# Patient Record
Sex: Female | Born: 1960 | Race: Black or African American | Hispanic: No | Smoking: Former smoker
Health system: Southern US, Community
[De-identification: ages and names within clinical notes are randomized; demographics above are authoritative.]

## PROBLEM LIST (undated history)

## (undated) DIAGNOSIS — F4541 Pain disorder exclusively related to psychological factors: Secondary | ICD-10-CM

## (undated) DIAGNOSIS — R51 Headache: Secondary | ICD-10-CM

## (undated) DIAGNOSIS — E119 Type 2 diabetes mellitus without complications: Secondary | ICD-10-CM

## (undated) DIAGNOSIS — D573 Sickle-cell trait: Secondary | ICD-10-CM

## (undated) DIAGNOSIS — M1990 Unspecified osteoarthritis, unspecified site: Secondary | ICD-10-CM

## (undated) DIAGNOSIS — G4730 Sleep apnea, unspecified: Secondary | ICD-10-CM

## (undated) DIAGNOSIS — R002 Palpitations: Secondary | ICD-10-CM

## (undated) DIAGNOSIS — M419 Scoliosis, unspecified: Secondary | ICD-10-CM

## (undated) DIAGNOSIS — F418 Other specified anxiety disorders: Secondary | ICD-10-CM

## (undated) DIAGNOSIS — I4891 Unspecified atrial fibrillation: Secondary | ICD-10-CM

## (undated) DIAGNOSIS — M47816 Spondylosis without myelopathy or radiculopathy, lumbar region: Secondary | ICD-10-CM

## (undated) DIAGNOSIS — I10 Essential (primary) hypertension: Secondary | ICD-10-CM

## (undated) DIAGNOSIS — E559 Vitamin D deficiency, unspecified: Secondary | ICD-10-CM

## (undated) DIAGNOSIS — E876 Hypokalemia: Secondary | ICD-10-CM

## (undated) DIAGNOSIS — F5105 Insomnia due to other mental disorder: Secondary | ICD-10-CM

## (undated) DIAGNOSIS — R519 Headache, unspecified: Secondary | ICD-10-CM

## (undated) DIAGNOSIS — E785 Hyperlipidemia, unspecified: Secondary | ICD-10-CM

## (undated) DIAGNOSIS — M1611 Unilateral primary osteoarthritis, right hip: Secondary | ICD-10-CM

## (undated) DIAGNOSIS — G4733 Obstructive sleep apnea (adult) (pediatric): Secondary | ICD-10-CM

## (undated) DIAGNOSIS — F5104 Psychophysiologic insomnia: Secondary | ICD-10-CM

## (undated) DIAGNOSIS — E538 Deficiency of other specified B group vitamins: Secondary | ICD-10-CM

## (undated) DIAGNOSIS — M546 Pain in thoracic spine: Secondary | ICD-10-CM

## (undated) DIAGNOSIS — K5090 Crohn's disease, unspecified, without complications: Secondary | ICD-10-CM

## (undated) DIAGNOSIS — Z800 Family history of malignant neoplasm of digestive organs: Secondary | ICD-10-CM

## (undated) DIAGNOSIS — G8929 Other chronic pain: Secondary | ICD-10-CM

## (undated) HISTORY — DX: Pain disorder exclusively related to psychological factors: F45.41

## (undated) HISTORY — DX: Palpitations: R00.2

## (undated) HISTORY — DX: Essential (primary) hypertension: I10

## (undated) HISTORY — DX: Spondylosis without myelopathy or radiculopathy, lumbar region: M47.816

## (undated) HISTORY — DX: Hypokalemia: E87.6

## (undated) HISTORY — DX: Insomnia due to other mental disorder: F51.05

## (undated) HISTORY — PX: EYE SURGERY: SHX253

## (undated) HISTORY — PX: COLONOSCOPY: SHX174

## (undated) HISTORY — DX: Family history of malignant neoplasm of digestive organs: Z80.0

## (undated) HISTORY — DX: Scoliosis, unspecified: M41.9

## (undated) HISTORY — PX: ABDOMINAL HYSTERECTOMY: SHX81

## (undated) HISTORY — DX: Hyperlipidemia, unspecified: E78.5

## (undated) HISTORY — DX: Pain in thoracic spine: M54.6

## (undated) HISTORY — DX: Other chronic pain: G89.29

## (undated) HISTORY — DX: Unspecified osteoarthritis, unspecified site: M19.90

---

## 2005-04-11 ENCOUNTER — Ambulatory Visit: Payer: Self-pay | Admitting: Family Medicine

## 2005-06-17 ENCOUNTER — Emergency Department: Payer: Self-pay | Admitting: Unknown Physician Specialty

## 2005-06-17 ENCOUNTER — Other Ambulatory Visit: Payer: Self-pay

## 2005-07-19 ENCOUNTER — Ambulatory Visit: Payer: Self-pay

## 2007-04-06 ENCOUNTER — Ambulatory Visit: Payer: Self-pay | Admitting: Unknown Physician Specialty

## 2007-05-18 ENCOUNTER — Ambulatory Visit: Payer: Self-pay | Admitting: Unknown Physician Specialty

## 2007-07-24 ENCOUNTER — Ambulatory Visit: Payer: Self-pay | Admitting: Family Medicine

## 2007-10-26 ENCOUNTER — Ambulatory Visit: Payer: Self-pay | Admitting: Family Medicine

## 2008-01-22 ENCOUNTER — Emergency Department: Payer: Self-pay | Admitting: Emergency Medicine

## 2008-01-22 ENCOUNTER — Other Ambulatory Visit: Payer: Self-pay

## 2009-10-11 ENCOUNTER — Ambulatory Visit: Payer: Self-pay | Admitting: Family Medicine

## 2010-06-06 ENCOUNTER — Emergency Department: Payer: Self-pay | Admitting: Emergency Medicine

## 2011-06-07 ENCOUNTER — Ambulatory Visit: Payer: Self-pay | Admitting: Family Medicine

## 2011-08-22 ENCOUNTER — Ambulatory Visit: Payer: Self-pay | Admitting: Family Medicine

## 2012-08-10 ENCOUNTER — Ambulatory Visit: Payer: Self-pay | Admitting: Internal Medicine

## 2012-09-25 ENCOUNTER — Ambulatory Visit: Payer: Self-pay | Admitting: Unknown Physician Specialty

## 2012-10-20 ENCOUNTER — Emergency Department: Payer: Self-pay | Admitting: Emergency Medicine

## 2012-10-20 LAB — CBC
HCT: 38.9 % (ref 35.0–47.0)
HGB: 13.5 g/dL (ref 12.0–16.0)
MCHC: 34.8 g/dL (ref 32.0–36.0)
Platelet: 245 10*3/uL (ref 150–440)
RDW: 14.4 % (ref 11.5–14.5)
WBC: 10.1 10*3/uL (ref 3.6–11.0)

## 2012-10-20 LAB — COMPREHENSIVE METABOLIC PANEL
Alkaline Phosphatase: 93 U/L (ref 50–136)
Anion Gap: 6 — ABNORMAL LOW (ref 7–16)
Bilirubin,Total: 0.2 mg/dL (ref 0.2–1.0)
Calcium, Total: 8.9 mg/dL (ref 8.5–10.1)
Co2: 29 mmol/L (ref 21–32)
Creatinine: 1.05 mg/dL (ref 0.60–1.30)
EGFR (Non-African Amer.): >60
Osmolality: 277 (ref 275–301)
Potassium: 3.5 mmol/L (ref 3.5–5.1)
SGPT (ALT): 33 U/L (ref 12–78)
Sodium: 138 mmol/L (ref 136–145)

## 2012-10-20 LAB — CK TOTAL AND CKMB (NOT AT ARMC)
CK, Total: 278 U/L — ABNORMAL HIGH (ref 21–215)
CK-MB: 1.9 ng/mL (ref 0.5–3.6)

## 2012-10-20 LAB — TROPONIN I: Troponin-I: <0.02 ng/mL

## 2014-04-09 ENCOUNTER — Emergency Department: Payer: Self-pay | Admitting: Emergency Medicine

## 2014-07-13 ENCOUNTER — Ambulatory Visit: Payer: Self-pay | Admitting: Nurse Practitioner

## 2015-08-18 ENCOUNTER — Encounter: Payer: Self-pay | Admitting: Family Medicine

## 2015-08-18 ENCOUNTER — Ambulatory Visit: Payer: Self-pay | Admitting: Family Medicine

## 2015-08-18 ENCOUNTER — Ambulatory Visit (INDEPENDENT_AMBULATORY_CARE_PROVIDER_SITE_OTHER): Payer: 59 | Admitting: Family Medicine

## 2015-08-18 VITALS — BP 114/76 | HR 86 | Temp 98.8°F | Resp 16 | Ht 64.5 in | Wt 165.8 lb

## 2015-08-18 DIAGNOSIS — I10 Essential (primary) hypertension: Secondary | ICD-10-CM | POA: Insufficient documentation

## 2015-08-18 DIAGNOSIS — M549 Dorsalgia, unspecified: Secondary | ICD-10-CM

## 2015-08-18 DIAGNOSIS — G4700 Insomnia, unspecified: Secondary | ICD-10-CM | POA: Diagnosis not present

## 2015-08-18 DIAGNOSIS — E785 Hyperlipidemia, unspecified: Secondary | ICD-10-CM

## 2015-08-18 DIAGNOSIS — M545 Low back pain, unspecified: Secondary | ICD-10-CM

## 2015-08-18 DIAGNOSIS — G8929 Other chronic pain: Secondary | ICD-10-CM | POA: Insufficient documentation

## 2015-08-18 DIAGNOSIS — F5104 Psychophysiologic insomnia: Secondary | ICD-10-CM | POA: Insufficient documentation

## 2015-08-18 DIAGNOSIS — M546 Pain in thoracic spine: Secondary | ICD-10-CM

## 2015-08-18 HISTORY — DX: Dorsalgia, unspecified: M54.9

## 2015-08-18 HISTORY — DX: Other chronic pain: G89.29

## 2015-08-18 MED ORDER — BENICAR HCT 40-12.5 MG PO TABS: 1.0000 | ORAL_TABLET | Freq: Every day | ORAL | Status: DC

## 2015-08-18 MED ORDER — ZOLPIDEM TARTRATE 10 MG PO TABS: 10.0000 mg | ORAL_TABLET | Freq: Every day | ORAL | Status: DC

## 2015-08-18 MED ORDER — SIMVASTATIN 40 MG PO TABS: 40.0000 mg | ORAL_TABLET | Freq: Every day | ORAL | Status: DC

## 2015-08-18 NOTE — Progress Notes (Signed)
Name: Samantha Stephens   MRN: [id number]    DOB: January 16, 1961   Date:08/18/2015       Progress Note  Subjective  Chief Complaint  Chief Complaint  Patient presents with  . Establish Care  . Medication Refill  . Arthritis    patient was told that she has arthritis in her back.  . Headache    patient has had a history of right sided head pain but is not sure if it is stress related    HPI  Samantha Stephens is a 54 y.o. female here today to transition care of medical needs to a primary care provider. She reports some months ago she was bending down to get something out of a cabinet and pulled her back. She was seen at [facility], no imaging done, given toradol, ibuprofen 800 mg and muscle relaxer medications and her symptoms improved over a few days. She reports no back pain today.  Has her annual CPE exams Jan-Feb yearly and reports no recent PAP due to total hysterectomy due to fibroid uterus. Has had mammogram early this year and flu shot to be done Aug 28, 2015.   Hyperlipidemia: Patient presents with hyperlipidemia.  She was tested because of comorbid conditions such as HTN. There is a family history of hyperlipidemia. There is not a family history of early ischemia heart disease.  She continues to use her Simvastatin 40 mg one a day with no side effects.   Patient is here for routine follow up of Hypertension. First diagnosed with hypertension several years ago. Current anti-hypertension medication regimen includes dietary modification, weight management and Brand name Benicar HCT 40-12.5 mg one a day, generic was not as effective.  Patient is following physician recommended management. Not checking blood pressure outside of physician office. Associated symptoms do not include headache, dizziness, nausea, lower extremity swelling, worsening shortness of breath, chest pain, numbness.  Insomnia Patient reports several years of difficulty initiating sleep and maintaining adequate sleep latency. Has  tried behavioral techniques and sleep hygiene with some modest benefits. OTC medications include benadryl and melatonin use in the past again with some modest benefits initially but overtime efficacy waned. Currently using Ambien 10 mg one at bed time nearly every night with no side effects. Improved sleep onset and latency with this medication.   Past Medical History  Diagnosis Date  . Hyperlipidemia   . Hypertension   . Arthritis   . Stress headaches     Patient Active Problem List   Diagnosis Date Noted  . Hypertension goal BP (blood pressure) < 140/90 08/18/2015  . Hyperlipidemia LDL goal <100 08/18/2015  . Insomnia, controlled 08/18/2015  . Acute lumbar back pain 08/18/2015    Social History  Substance Use Topics  . Smoking status: Former Research scientist (life sciences)  . Smokeless tobacco: Not on file     Comment: but only for a short amount of time  . Alcohol Use: Not on file     Comment: occasional glass of wine     Current outpatient prescriptions:  .  BENICAR HCT 40-12.5 MG tablet, Take 1 tablet by mouth daily., Disp: 90 tablet, Rfl: 3 .  simvastatin (ZOCOR) 40 MG tablet, Take 1 tablet (40 mg total) by mouth daily at 6 PM., Disp: 90 tablet, Rfl: 3 .  zolpidem (AMBIEN) 10 MG tablet, Take 1 tablet (10 mg total) by mouth at bedtime., Disp: 90 tablet, Rfl: 3  Past Surgical History  Procedure Laterality Date  . Abdominal hysterectomy    .  Cesarean section      1    Family History  Problem Relation Age of Onset  . Arthritis Mother   . COPD Mother   . Depression Mother   . Diabetes Mother   . Hyperlipidemia Mother   . Hypertension Mother   . Cataracts Mother   . Cancer Father   . Hearing loss Father   . Kidney disease Father   . Cataracts Father   . Asthma Sister   . Cancer Paternal Grandmother   . Mental illness Sister     No Known Allergies   Review of Systems  CONSTITUTIONAL: No significant weight changes, fever, chills, weakness or fatigue.  HEENT:  - Eyes: No visual  changes.  - Ears: No auditory changes. No pain.  - Nose: No sneezing, congestion, runny nose. - Throat: No sore throat. No changes in swallowing. SKIN: No rash or itching.  CARDIOVASCULAR: No chest pain, chest pressure or chest discomfort. No palpitations or edema.  RESPIRATORY: No shortness of breath, cough or sputum.  GASTROINTESTINAL: No anorexia, nausea, vomiting. No changes in bowel habits. No abdominal pain or blood.  GENITOURINARY: No dysuria. No frequency. No discharge. NEUROLOGICAL: No headache, dizziness, syncope, paralysis, ataxia, numbness or tingling in the extremities. No memory changes. No change in bowel or bladder control.  MUSCULOSKELETAL: No joint pain. No muscle pain. HEMATOLOGIC: No anemia, bleeding or bruising.  LYMPHATICS: No enlarged lymph nodes.  PSYCHIATRIC: No change in mood. No change in sleep pattern.  ENDOCRINOLOGIC: No reports of sweating, cold or heat intolerance. No polyuria or polydipsia.     Objective  BP 114/76 mmHg  Pulse 86  Temp(Src) 98.8 F (37.1 C) (Oral)  Resp 16  Ht 5' 4.5" (1.638 m)  Wt 165 lb 12.8 oz (75.206 kg)  BMI 28.03 kg/m2  SpO2 97% Body mass index is 28.03 kg/(m^2).  Physical Exam  Constitutional: Patient appears well-developed and well-nourished. In no distress.  HEENT:  - Head: Normocephalic and atraumatic.  - Ears: Bilateral TMs gray, no erythema or effusion - Nose: Nasal mucosa moist - Mouth/Throat: Oropharynx is clear and moist. No tonsillar hypertrophy or erythema. No post nasal drainage.  - Eyes: Conjunctivae clear, EOM movements normal. PERRLA. No scleral icterus.  Neck: Normal range of motion. Neck supple. No JVD present. No thyromegaly present.  Cardiovascular: Normal rate, regular rhythm and normal heart sounds.  No murmur heard.  Pulmonary/Chest: Effort normal and breath sounds normal. No respiratory distress. Musculoskeletal: Normal range of motion bilateral UE and LE, no joint effusions. Peripheral vascular:  Bilateral LE no edema. Neurological: CN II-XII grossly intact with no focal deficits. Alert and oriented to person, place, and time. Coordination, balance, strength, speech and gait are normal.  Skin: Skin is warm and dry. No rash noted. No erythema.  Psychiatric: Patient has a normal mood and affect. Behavior is normal in office today. Judgment and thought content normal in office today.   Assessment & Plan  1. Hypertension goal BP (blood pressure) < 140/90 Stable. Refilled.  - BENICAR HCT 40-12.5 MG tablet; Take 1 tablet by mouth daily.  Dispense: 90 tablet; Refill: 3 - CBC with Differential/Platelet - Comprehensive metabolic panel - Hemoglobin A1c - Lipid panel - TSH  2. Hyperlipidemia LDL goal <100 Will get updated blood work and adjust med dose if needed. Refilled for now.  - simvastatin (ZOCOR) 40 MG tablet; Take 1 tablet (40 mg total) by mouth daily at 6 PM.  Dispense: 90 tablet; Refill: 3 - CBC with Differential/Platelet -  Comprehensive metabolic panel - Hemoglobin A1c - Lipid panel - TSH  3. Insomnia, controlled Controlled.   - zolpidem (AMBIEN) 10 MG tablet; Take 1 tablet (10 mg total) by mouth at bedtime.  Dispense: 90 tablet; Refill: 3 - CBC with Differential/Platelet - Comprehensive metabolic panel - Hemoglobin A1c - Lipid panel - TSH  4. Acute lumbar back pain Resolved. We discussed potential pathology and long term risk of reoccurrence. Maintaining an ideal body habitus, regular exercise, proper lifting techniques and mindfulness of exacerbating factors will be useful in long term management.  Instructed on use of heating pad with exercises. Consider concomitant therapy with PT, massage therapist or chiropractor. May use anti-inflammatory medication and muscle relaxer as needed.  - CBC with Differential/Platelet - Comprehensive metabolic panel - Hemoglobin A1c - Lipid panel - TSH

## 2015-08-23 ENCOUNTER — Encounter: Payer: Self-pay | Admitting: Family Medicine

## 2015-10-19 ENCOUNTER — Emergency Department: Payer: 59

## 2015-10-19 ENCOUNTER — Emergency Department
Admission: EM | Admit: 2015-10-19 | Discharge: 2015-10-19 | Discharge status: Against Medical Advice | Payer: 59 | Attending: Student | Admitting: Student

## 2015-10-19 ENCOUNTER — Encounter: Payer: Self-pay | Admitting: *Deleted

## 2015-10-19 DIAGNOSIS — Z87891 Personal history of nicotine dependence: Secondary | ICD-10-CM | POA: Diagnosis not present

## 2015-10-19 DIAGNOSIS — R000 Tachycardia, unspecified: Secondary | ICD-10-CM | POA: Diagnosis not present

## 2015-10-19 DIAGNOSIS — I10 Essential (primary) hypertension: Secondary | ICD-10-CM | POA: Diagnosis not present

## 2015-10-19 DIAGNOSIS — R079 Chest pain, unspecified: Secondary | ICD-10-CM | POA: Diagnosis present

## 2015-10-19 DIAGNOSIS — R0789 Other chest pain: Secondary | ICD-10-CM | POA: Diagnosis not present

## 2015-10-19 LAB — BASIC METABOLIC PANEL
Anion gap: 8 (ref 5–15)
BUN: 9 mg/dL (ref 6–20)
CALCIUM: 9.9 mg/dL (ref 8.9–10.3)
CO2: 30 mmol/L (ref 22–32)
CREATININE: 0.91 mg/dL (ref 0.44–1.00)
Chloride: 103 mmol/L (ref 101–111)
GFR calc Af Amer: >60 mL/min (ref >60)
GLUCOSE: 75 mg/dL (ref 65–99)
Potassium: 3.8 mmol/L (ref 3.5–5.1)
SODIUM: 141 mmol/L (ref 135–145)

## 2015-10-19 LAB — CBC
HCT: 39.5 % (ref 35.0–47.0)
Hemoglobin: 13.5 g/dL (ref 12.0–16.0)
MCH: 26.1 pg (ref 26.0–34.0)
MCHC: 34.1 g/dL (ref 32.0–36.0)
MCV: 76.5 fL — AB (ref 80.0–100.0)
PLATELETS: 274 10*3/uL (ref 150–440)
RBC: 5.17 MIL/uL (ref 3.80–5.20)
RDW: 14 % (ref 11.5–14.5)
WBC: 8.5 10*3/uL (ref 3.6–11.0)

## 2015-10-19 LAB — TROPONIN I

## 2015-10-19 NOTE — ED Notes (Signed)
Today had episode of her heart racing, felt some chest tightness

## 2015-10-20 ENCOUNTER — Telehealth: Payer: Self-pay | Admitting: Emergency Medicine

## 2015-10-20 NOTE — ED Notes (Signed)
Called patient due to lwot to inquire about condition and follow up plans. Left message with my number.

## 2015-10-27 ENCOUNTER — Ambulatory Visit (INDEPENDENT_AMBULATORY_CARE_PROVIDER_SITE_OTHER): Payer: 59 | Admitting: Family Medicine

## 2015-10-27 ENCOUNTER — Encounter: Payer: Self-pay | Admitting: Family Medicine

## 2015-10-27 VITALS — HR 70 | Temp 98.8°F | Resp 16 | Wt 166.9 lb

## 2015-10-27 DIAGNOSIS — R002 Palpitations: Secondary | ICD-10-CM

## 2015-10-27 DIAGNOSIS — F418 Other specified anxiety disorders: Secondary | ICD-10-CM

## 2015-10-27 MED ORDER — CLONAZEPAM 0.5 MG PO TABS: 0.5000 mg | ORAL_TABLET | Freq: Two times a day (BID) | ORAL | Status: DC | PRN

## 2015-10-27 NOTE — Progress Notes (Signed)
Name: Samantha Stephens   MRN: [id number]    DOB: 1961/07/27   Date:10/27/2015       Progress Note  Subjective  Chief Complaint  Chief Complaint  Patient presents with  . Follow-up    ER for racing heart normal bloodwork, EKG and chest xray.  Never saw the Dr, at ER due to wait time.    . Palpitations    Patients states under increase amounts of stree due to work and family issues.  Has had 4 episodes, which include sob, palpitaions and dizziness    HPI  Samantha Stephens is a 55 year old female with known history of HTN and HLD who was seen in the ER due to racing heart beat on 09/19/15. ER lab work CBC, CMP, troponin unremarkable. EKG NSR without ST-segment changes. Still having palpitations during stressful moments but can also happen when sitting in car quitly. Has had more stress in her life overall these past months. Her palpitations are associated with brief moments of SOB and dizziness at the time of the events but not chest pain or pre-syncope.   A few years ago she was having similar episodes and consulted with Dr. Clayborn Bigness, cardiology. She had a stress test, Echo and holtor monitor examination completed. This revealed mild intermittent  arrhythmia (she is not sure if they were PVCs or arrhythmias) but at the time medication therapy was not needed.   Past Medical History  Diagnosis Date  . Hyperlipidemia   . Hypertension   . Arthritis   . Stress headaches     Patient Active Problem List   Diagnosis Date Noted  . Hypertension goal BP (blood pressure) < 140/90 08/18/2015  . Hyperlipidemia LDL goal <100 08/18/2015  . Insomnia, controlled 08/18/2015  . Acute lumbar back pain 08/18/2015    Social History  Substance Use Topics  . Smoking status: Former Research scientist (life sciences)  . Smokeless tobacco: Not on file     Comment: but only for a short amount of time  . Alcohol Use: Not on file     Comment: occasional glass of wine     Current outpatient prescriptions:  .  BENICAR HCT 40-12.5 MG  tablet, Take 1 tablet by mouth daily., Disp: 90 tablet, Rfl: 3 .  cyclobenzaprine (FLEXERIL) 10 MG tablet, Take 1 tablet by mouth 2 (two) times daily as needed., Disp: , Rfl: 0 .  ibuprofen (ADVIL,MOTRIN) 800 MG tablet, Take 1 tablet by mouth every 8 (eight) hours as needed., Disp: , Rfl: 0 .  simvastatin (ZOCOR) 40 MG tablet, Take 1 tablet (40 mg total) by mouth daily at 6 PM., Disp: 90 tablet, Rfl: 3 .  zolpidem (AMBIEN) 10 MG tablet, Take 1 tablet (10 mg total) by mouth at bedtime., Disp: 90 tablet, Rfl: 3  Past Surgical History  Procedure Laterality Date  . Abdominal hysterectomy    . Cesarean section      1    Family History  Problem Relation Age of Onset  . Arthritis Mother   . COPD Mother   . Depression Mother   . Diabetes Mother   . Hyperlipidemia Mother   . Hypertension Mother   . Cataracts Mother   . Cancer Father   . Hearing loss Father   . Kidney disease Father   . Cataracts Father   . Asthma Sister   . Cancer Paternal Grandmother   . Mental illness Sister     No Known Allergies   Review of Systems  CONSTITUTIONAL: No significant  weight changes, fever, chills, weakness or fatigue.  CARDIOVASCULAR: No chest pain, chest pressure or chest discomfort. Intermitent palpitations without edema.  RESPIRATORY: No shortness of breath, cough or sputum.  NEUROLOGICAL: No headache, syncope, paralysis, ataxia, numbness or tingling in the extremities. No memory changes. No change in bowel or bladder control.  MUSCULOSKELETAL: No joint pain. No muscle pain. PSYCHIATRIC: Yes change in mood. No change in sleep pattern.  ENDOCRINOLOGIC: No reports of sweating, cold or heat intolerance. No polyuria or polydipsia.     Objective  Pulse 70  Temp(Src) 98.8 F (37.1 C) (Oral)  Resp 16  Wt 166 lb 14.4 oz (75.705 kg)  SpO2 93% Body mass index is 29.57 kg/(m^2).  Physical Exam  Constitutional: Patient appears well-developed and well-nourished. In no distress.  HEENT:  -  Head: Normocephalic and atraumatic.  - Ears: Bilateral TMs gray, no erythema or effusion - Nose: Nasal mucosa moist - Mouth/Throat: Oropharynx is clear and moist. No tonsillar hypertrophy or erythema. No post nasal drainage.  - Eyes: Conjunctivae clear, EOM movements normal. PERRLA. No scleral icterus.  Neck: Normal range of motion. Neck supple. No JVD present. No thyromegaly present.  Cardiovascular: Normal rate, regular rhythm and normal heart sounds.  No murmur heard.  Pulmonary/Chest: Effort normal and breath sounds normal. No respiratory distress. Musculoskeletal: Normal range of motion bilateral UE and LE, no joint effusions. Peripheral vascular: Bilateral LE no edema. Neurological: CN II-XII grossly intact with no focal deficits. Alert and oriented to person, place, and time. Coordination, balance, strength, speech and gait are normal.  Skin: Skin is warm and dry. No rash noted. No erythema.  Psychiatric: Patient has a normal mood and affect. Behavior is normal in office today. Judgment and thought content normal in office today.   Recent Results (from the past 2160 hour(s))  Basic metabolic panel     Status: None   Collection Time: 10/19/15  5:54 PM  Result Value Ref Range   Sodium 141 135 - 145 mmol/L   Potassium 3.8 3.5 - 5.1 mmol/L   Chloride 103 101 - 111 mmol/L   CO2 30 22 - 32 mmol/L   Glucose, Bld 75 65 - 99 mg/dL   BUN 9 6 - 20 mg/dL   Creatinine, Ser 0.91 0.44 - 1.00 mg/dL   Calcium 9.9 8.9 - 10.3 mg/dL   GFR calc non Af Amer >60 >60 mL/min   GFR calc Af Amer >60 >60 mL/min    Comment: (NOTE) The eGFR has been calculated using the CKD EPI equation. This calculation has not been validated in all clinical situations. eGFR's persistently <60 mL/min signify possible Chronic Kidney Disease.    Anion gap 8 5 - 15  CBC     Status: Abnormal   Collection Time: 10/19/15  5:54 PM  Result Value Ref Range   WBC 8.5 3.6 - 11.0 K/uL   RBC 5.17 3.80 - 5.20 MIL/uL    Hemoglobin 13.5 12.0 - 16.0 g/dL   HCT 39.5 35.0 - 47.0 %   MCV 76.5 (L) 80.0 - 100.0 fL   MCH 26.1 26.0 - 34.0 pg   MCHC 34.1 32.0 - 36.0 g/dL   RDW 14.0 11.5 - 14.5 %   Platelets 274 150 - 440 K/uL  Troponin I     Status: None   Collection Time: 10/19/15  5:54 PM  Result Value Ref Range   Troponin I <0.03 <0.031 ng/mL    Comment:        NO INDICATION OF  MYOCARDIAL INJURY.      Assessment & Plan  1. Intermittent palpitations Will treat with prn benzodiazepine and if no improvement we decided on re consulting with Cardiology as she does have risk factors such as HLD, HTN. Rule out thyroid disorder.   - clonazePAM (KLONOPIN) 0.5 MG tablet; Take 1 tablet (0.5 mg total) by mouth 2 (two) times daily as needed for anxiety.  Dispense: 30 tablet; Refill: 1 - TSH - T3, free - T4, free - Magnesium  2. Situational anxiety  - clonazePAM (KLONOPIN) 0.5 MG tablet; Take 1 tablet (0.5 mg total) by mouth 2 (two) times daily as needed for anxiety.  Dispense: 30 tablet; Refill: 1

## 2015-10-28 LAB — T3, FREE: T3 FREE: 2.8 pg/mL (ref 2.0–4.4)

## 2015-10-28 LAB — T4, FREE: FREE T4: 1.07 ng/dL (ref 0.82–1.77)

## 2015-10-28 LAB — TSH: TSH: 1.54 u[IU]/mL (ref 0.450–4.500)

## 2015-10-28 LAB — MAGNESIUM: Magnesium: 2.3 mg/dL (ref 1.6–2.3)

## 2016-01-12 ENCOUNTER — Encounter: Payer: 59 | Admitting: Family Medicine

## 2016-02-26 ENCOUNTER — Encounter: Payer: 59 | Admitting: Family Medicine

## 2016-02-27 ENCOUNTER — Other Ambulatory Visit: Payer: Self-pay | Admitting: Family Medicine

## 2016-02-27 DIAGNOSIS — G4700 Insomnia, unspecified: Secondary | ICD-10-CM

## 2016-02-27 NOTE — Telephone Encounter (Signed)
Patient has appointment scheduled for 03-08-16 but only have 7 pills left of the zolpidem. Please send a short supply to [facility]

## 2016-02-27 NOTE — Telephone Encounter (Signed)
Controlled substance; has not been seen in 6 months; I won't be able to fill; sorry I recommend she take only HALF of a pill by mouth at bedtime (5 mg) so she can start to taper down on this and she will have enough before her appt We'll talk about my thoughts on this medicine at her appointment, so I'll try to help her come off of this

## 2016-02-28 NOTE — Telephone Encounter (Signed)
Pt.notified

## 2016-03-08 ENCOUNTER — Ambulatory Visit (INDEPENDENT_AMBULATORY_CARE_PROVIDER_SITE_OTHER): Payer: 59 | Admitting: Family Medicine

## 2016-03-08 ENCOUNTER — Encounter: Payer: Self-pay | Admitting: Family Medicine

## 2016-03-08 VITALS — BP 118/72 | HR 97 | Temp 98.2°F | Resp 20 | Ht 63.0 in | Wt 165.4 lb

## 2016-03-08 DIAGNOSIS — Z1239 Encounter for other screening for malignant neoplasm of breast: Secondary | ICD-10-CM | POA: Diagnosis not present

## 2016-03-08 DIAGNOSIS — E785 Hyperlipidemia, unspecified: Secondary | ICD-10-CM

## 2016-03-08 DIAGNOSIS — G4700 Insomnia, unspecified: Secondary | ICD-10-CM | POA: Diagnosis not present

## 2016-03-08 DIAGNOSIS — M419 Scoliosis, unspecified: Secondary | ICD-10-CM

## 2016-03-08 DIAGNOSIS — Z0000 Encounter for general adult medical examination without abnormal findings: Secondary | ICD-10-CM | POA: Diagnosis not present

## 2016-03-08 DIAGNOSIS — Z800 Family history of malignant neoplasm of digestive organs: Secondary | ICD-10-CM

## 2016-03-08 DIAGNOSIS — I10 Essential (primary) hypertension: Secondary | ICD-10-CM

## 2016-03-08 DIAGNOSIS — M47816 Spondylosis without myelopathy or radiculopathy, lumbar region: Secondary | ICD-10-CM

## 2016-03-08 HISTORY — DX: Scoliosis, unspecified: M41.9

## 2016-03-08 HISTORY — DX: Spondylosis without myelopathy or radiculopathy, lumbar region: M47.816

## 2016-03-08 HISTORY — DX: Family history of malignant neoplasm of digestive organs: Z80.0

## 2016-03-08 MED ORDER — BENICAR HCT 40-12.5 MG PO TABS: 1.0000 | ORAL_TABLET | Freq: Every day | ORAL | Status: DC

## 2016-03-08 MED ORDER — TRAZODONE HCL 50 MG PO TABS: 25.0000 mg | ORAL_TABLET | Freq: Every evening | ORAL | Status: DC | PRN

## 2016-03-08 MED ORDER — SIMVASTATIN 40 MG PO TABS: 40.0000 mg | ORAL_TABLET | Freq: Every day | ORAL | Status: DC

## 2016-03-08 NOTE — Progress Notes (Signed)
Patient ID: Samantha Stephens, female   DOB: 1961/05/26, 55 y.o.   MRN: [id number]   Subjective:   Samantha Stephens is a 55 y.o. female here for a complete physical exam  Interim issues since last visit: stress with aging parents, boss is sick They did xrays, back pain, hurts all the time, arthritis seen on xray  USPSTF grade A and B recommendations Alcohol: well under Depression:  Depression screen [place] 2/9 03/08/2016 08/18/2015  Decreased Interest 0 0  Down, Depressed, Hopeless 0 0  PHQ - 2 Score 0 0   Hypertension: controlled Obesity: no Tobacco use: no HIV, hep B, hep C: politely declined STD testing and prevention (chl/gon/syphilis):  Politely declined Lipids: today Glucose: today Colorectal cancer:  Runs in the family Breast cancer: no fam hx BRCA gene screening: no breast cancer or ovarian cancer Intimate partner violence: no Cervical cancer screening: no Lung cancer: n/a Osteoporosis: no; start 23 Fall prevention/vitamin D: not taking vit D AAA: n/a Aspirin: baby aspirin 81 to prevent stroke Diet: typical, but does eat processed meats Exercise: gets a lot of walking, used to do dance class Skin cancer: no worrisome moles   Past Medical History  Diagnosis Date  . Hyperlipidemia   . Hypertension   . Arthritis   . Stress headaches   . Chronic mid back pain 08/18/2015  . Scoliosis 03/08/2016  . 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 Family hx of colon cancer requiring screening colonoscopy 03/08/2016  MD note: hx of crohn's disease  Past Surgical History  Procedure Laterality Date  . Abdominal hysterectomy    . Cesarean section      1   Family History  Problem Relation Age of Onset  . Arthritis Mother   . COPD Mother   . Depression Mother   . Diabetes Mother   . Hyperlipidemia Mother   . Hypertension Mother   . Cataracts Mother   . Cancer Father   . Hearing loss Father   . Kidney disease Father   . Cataracts Father   . Asthma Sister    . Cancer Paternal Grandmother   . Mental illness Sister    Social History  Substance Use Topics  . Smoking status: Former Research scientist (life sciences)  . Smokeless tobacco: Not on file     Comment: but only for a short amount of time  . Alcohol Use: Not on file     Comment: occasional glass of wine   Review of Systems  Constitutional: Negative for unexpected weight change.  HENT: Negative for hearing loss.   Respiratory: Negative for cough and shortness of breath.   Cardiovascular: Negative for chest pain.  Gastrointestinal: Negative for blood in stool.  Genitourinary: Negative for hematuria and pelvic pain.  Hematological: Does not bruise/bleed easily.   Objective:   Filed Vitals:   03/08/16 0824  BP: 118/72  Pulse: 97  Temp: 98.2 F (36.8 C)  Resp: 20  Height: _0  (1.6 m)  Weight: 165 lb 7 oz (75.042 kg)  SpO2: 97%   Body mass index is 29.31 kg/(m^2). Wt Readings from Last 3 Encounters:  03/08/16 165 lb 7 oz (75.042 kg)  10/27/15 166 lb 14.4 oz (75.705 kg)  10/19/15 168 lb (76.204 kg)   Physical Exam  Constitutional: She appears well-developed and well-nourished.  HENT:  Head: Normocephalic and atraumatic.  Right Ear: Hearing, tympanic membrane, external ear and ear canal normal.  Left Ear: Hearing, tympanic membrane, external ear and ear canal normal.  Eyes:  Conjunctivae and EOM are normal. Right eye exhibits no hordeolum. Left eye exhibits no hordeolum. No scleral icterus.  Neck: Carotid bruit is not present. No thyromegaly present.  Cardiovascular: Normal rate, regular rhythm, S1 normal, S2 normal and normal heart sounds.   No extrasystoles are present.  Pulmonary/Chest: Effort normal and breath sounds normal. No respiratory distress. Right breast exhibits no inverted nipple, no mass, no nipple discharge, no skin change and no tenderness. Left breast exhibits no inverted nipple, no mass, no nipple discharge, no skin change and no tenderness. Breasts are symmetrical.  Abdominal:  Soft. Normal appearance and bowel sounds are normal. She exhibits no distension, no abdominal bruit, no pulsatile midline mass and no mass. There is no hepatosplenomegaly. There is no tenderness. No hernia.  Musculoskeletal: Normal range of motion. She exhibits no edema.  Lymphadenopathy:       Head (right side): No submandibular adenopathy present.       Head (left side): No submandibular adenopathy present.    She has no cervical adenopathy.    She has no axillary adenopathy.  Neurological: She is alert. She displays no tremor. No cranial nerve deficit. She exhibits normal muscle tone. Gait normal.  Reflex Scores:      Patellar reflexes are 2+ on the right side and 2+ on the left side. Skin: Skin is warm and dry. No bruising and no ecchymosis noted. No cyanosis. No pallor.  Psychiatric: Her speech is normal and behavior is normal. Thought content normal. Her mood appears not anxious. She does not exhibit a depressed mood.    Assessment/Plan:   Problem List Items Addressed This Visit      Cardiovascular and Mediastinum   Hypertension goal BP (blood pressure) < 140/90    Continue same; DASH guidelines      Relevant Medications   simvastatin (ZOCOR) 40 MG tablet   BENICAR HCT 40-12.5 MG tablet     Musculoskeletal and Integument   Degenerative arthritis of lumbar spine   Scoliosis     Other   Family hx of colon cancer requiring screening colonoscopy   Relevant Orders   Ambulatory referral to Gastroenterology   Hyperlipidemia LDL goal <100   Relevant Medications   simvastatin (ZOCOR) 40 MG tablet   BENICAR HCT 40-12.5 MG tablet   Insomnia, controlled    Other Visit Diagnoses    Preventative health care    -  Primary    Relevant Orders    Comprehensive metabolic panel (Completed)    Lipid Panel w/o Chol/HDL Ratio (Completed)    CBC with Differential/Platelet (Completed)    Breast cancer screening        Relevant Orders    MM DIGITAL SCREENING BILATERAL        Meds  ordered this encounter  Medications  . DISCONTD: traZODone (DESYREL) 50 MG tablet    Sig: Take 0.5-1 tablets (25-50 mg total) by mouth at bedtime as needed for sleep.    Dispense:  30 tablet    Refill:  5  . traZODone (DESYREL) 50 MG tablet    Sig: Take 0.5-1 tablets (25-50 mg total) by mouth at bedtime as needed for sleep.    Dispense:  30 tablet    Refill:  0  . simvastatin (ZOCOR) 40 MG tablet    Sig: Take 1 tablet (40 mg total) by mouth daily at 6 PM.    Dispense:  90 tablet    Refill:  1  . BENICAR HCT 40-12.5 MG tablet  Sig: Take 1 tablet by mouth daily.    Dispense:  90 tablet    Refill:  1   Orders Placed This Encounter  Procedures  . MM DIGITAL SCREENING BILATERAL    Standing Status: Future     Number of Occurrences:      Standing Expiration Date: 03/08/2017    Order Specific Question:  Reason for Exam (SYMPTOM  OR DIAGNOSIS REQUIRED)    Answer:  screening    Order Specific Question:  Is the patient pregnant?    Answer:  No    Order Specific Question:  Preferred imaging location?    Answer:  [facility]  . Comprehensive metabolic panel    Order Specific Question:  Has the patient fasted?    Answer:  Yes  . Lipid Panel w/o Chol/HDL Ratio    Order Specific Question:  Has the patient fasted?    Answer:  Yes  . CBC with Differential/Platelet  . Ambulatory referral to Gastroenterology    Referral Priority:  Routine    Referral Type:  Consultation    Referral Reason:  Specialty Services Required    Number of Visits Requested:  1    Follow up plan: Return in about 1 year (around 03/08/2017) for complete physical, 6 months for cholesterol.  An after-visit summary was printed and given to the patient at [facility].  Please see the patient instructions which may contain other information and recommendations beyond what is mentioned above in the assessment and plan.

## 2016-03-08 NOTE — Assessment & Plan Note (Signed)
Continue same; DASH guidelines

## 2016-03-08 NOTE — Patient Instructions (Addendum)
Please do call to schedule your mammogram; the number to schedule one at either [facility] or [facility] is [phone] Take 1000 iu of vitamin D3 daily Try to limit saturated fats in your diet (bologna, hot dogs, barbeque, cheeseburgers, hamburgers, steak, bacon, sausage, cheese, etc.) and get more fresh fruits, vegetables, and whole grains  Return in 6 months regular cholesterol, etc and 1 year for next physical  Health Maintenance, Female Adopting a healthy lifestyle and getting preventive care can go a long way to promote health and wellness. Talk with your health care provider about what schedule of regular examinations is right for you. This is a good chance for you to check in with your provider about disease prevention and staying healthy. In between checkups, there are plenty of things you can do on your own. Experts have done a lot of research about which lifestyle changes and preventive measures are most likely to keep you healthy. Ask your health care provider for more information. WEIGHT AND DIET  Eat a healthy diet  Be sure to include plenty of vegetables, fruits, low-fat dairy products, and lean protein.  Do not eat a lot of foods high in solid fats, added sugars, or salt.  Get regular exercise. This is one of the most important things you can do for your health.  Most adults should exercise for at least 150 minutes each week. The exercise should increase your heart rate and make you sweat (moderate-intensity exercise).  Most adults should also do strengthening exercises at least twice a week. This is in addition to the moderate-intensity exercise.  Maintain a healthy weight  Body mass index (BMI) is a measurement that can be used to identify possible weight problems. It estimates body fat based on height and weight. Your health care provider can help determine your BMI and help you achieve or maintain a healthy weight.  For females 56 years of  age and older:   A BMI below 18.5 is considered underweight.  A BMI of 18.5 to 24.9 is normal.  A BMI of 25 to 29.9 is considered overweight.  A BMI of 30 and above is considered obese.  Watch levels of cholesterol and blood lipids  You should start having your blood tested for lipids and cholesterol at 56 years of age, then have this test every 5 years.  You may need to have your cholesterol levels checked more often if:  Your lipid or cholesterol levels are high.  You are older than 56 years of age.  You are at high risk for heart disease.  CANCER SCREENING   Lung Cancer  Lung cancer screening is recommended for adults 56-78 years old who are at high risk for lung cancer because of a history of smoking.  A yearly low-dose CT scan of the lungs is recommended for people who:  Currently smoke.  Have quit within the past 15 years.  Have at least a 30-pack-year history of smoking. A pack year is smoking an average of one pack of cigarettes a day for 1 year.  Yearly screening should continue until it has been 15 years since you quit.  Yearly screening should stop if you develop a health problem that would prevent you from having lung cancer treatment.  Breast Cancer  Practice breast self-awareness. This means understanding how your breasts normally appear and feel.  It also means doing regular breast self-exams. Let your health care provider know about any changes, no matter how small.  If you are in your 20s or 30s, you should have a clinical breast exam (CBE) by a health care provider every 1-3 years as part of a regular health exam.  If you are 35 or older, have a CBE every year. Also consider having a breast X-ray (mammogram) every year.  If you have a family history of breast cancer, talk to your health care provider about genetic screening.  If you are at high risk for breast cancer, talk to your health care provider about having an MRI and a mammogram every  year.  Breast cancer gene (BRCA) assessment is recommended for women who have family members with BRCA-related cancers. BRCA-related cancers include:  Breast.  Ovarian.  Tubal.  Peritoneal cancers.  Results of the assessment will determine the need for genetic counseling and BRCA1 and BRCA2 testing. Cervical Cancer Your health care provider may recommend that you be screened regularly for cancer of the pelvic organs (ovaries, uterus, and vagina). This screening involves a pelvic examination, including checking for microscopic changes to the surface of your cervix (Pap test). You may be encouraged to have this screening done every 3 years, beginning at age 56.  For women ages 74-65, health care providers may recommend pelvic exams and Pap testing every 3 years, or they may recommend the Pap and pelvic exam, combined with testing for human papilloma virus (HPV), every 5 years. Some types of HPV increase your risk of cervical cancer. Testing for HPV may also be done on women of any age with unclear Pap test results.  Other health care providers may not recommend any screening for nonpregnant women who are considered low risk for pelvic cancer and who do not have symptoms. Ask your health care provider if a screening pelvic exam is right for you.  If you have had past treatment for cervical cancer or a condition that could lead to cancer, you need Pap tests and screening for cancer for at least 20 years after your treatment. If Pap tests have been discontinued, your risk factors (such as having a new sexual partner) need to be reassessed to determine if screening should resume. Some women have medical problems that increase the chance of getting cervical cancer. In these cases, your health care provider may recommend more frequent screening and Pap tests. Colorectal Cancer  This type of cancer can be detected and often prevented.  Routine colorectal cancer screening usually begins at 56 years of  age and continues through 56 years of age.  Your health care provider may recommend screening at an earlier age if you have risk factors for colon cancer.  Your health care provider may also recommend using home test kits to check for hidden blood in the stool.  A small camera at the end of a tube can be used to examine your colon directly (sigmoidoscopy or colonoscopy). This is done to check for the earliest forms of colorectal cancer.  Routine screening usually begins at age 62.  Direct examination of the colon should be repeated every 5-10 years through 56 years of age. However, you may need to be screened more often if early forms of precancerous polyps or small growths are found. Skin Cancer  Check your skin from head to toe regularly.  Tell your health care provider about any new moles or changes in moles, especially if there is a change in a mole's shape or color.  Also tell your health care provider if you have a mole that is larger than the  size of a pencil eraser.  Always use sunscreen. Apply sunscreen liberally and repeatedly throughout the day.  Protect yourself by wearing long sleeves, pants, a wide-brimmed hat, and sunglasses whenever you are outside. HEART DISEASE, DIABETES, AND HIGH BLOOD PRESSURE   High blood pressure causes heart disease and increases the risk of stroke. High blood pressure is more likely to develop in:  People who have blood pressure in the high end of the normal range (130-139/85-89 mm Hg).  People who are overweight or obese.  People who are African American.  If you are 72-62 years of age, have your blood pressure checked every 3-5 years. If you are 23 years of age or older, have your blood pressure checked every year. You should have your blood pressure measured twice--once when you are at a hospital or clinic, and once when you are not at a hospital or clinic. Record the average of the two measurements. To check your blood pressure when you are  not at a hospital or clinic, you can use:  An automated blood pressure machine at a pharmacy.  A home blood pressure monitor.  If you are between 3 years and 22 years old, ask your health care provider if you should take aspirin to prevent strokes.  Have regular diabetes screenings. This involves taking a blood sample to check your fasting blood sugar level.  If you are at a normal weight and have a low risk for diabetes, have this test once every three years after 56 years of age.  If you are overweight and have a high risk for diabetes, consider being tested at a younger age or more often. PREVENTING INFECTION  Hepatitis B  If you have a higher risk for hepatitis B, you should be screened for this virus. You are considered at high risk for hepatitis B if:  You were born in a country where hepatitis B is common. Ask your health care provider which countries are considered high risk.  Your parents were born in a high-risk country, and you have not been immunized against hepatitis B (hepatitis B vaccine).  You have HIV or AIDS.  You use needles to inject street drugs.  You live with someone who has hepatitis B.  You have had sex with someone who has hepatitis B.  You get hemodialysis treatment.  You take certain medicines for conditions, including cancer, organ transplantation, and autoimmune conditions. Hepatitis C  Blood testing is recommended for:  Everyone born from [birth year] through 1966.  Anyone with known risk factors for hepatitis C. Sexually transmitted infections (STIs)  You should be screened for sexually transmitted infections (STIs) including gonorrhea and chlamydia if:  You are sexually active and are younger than 56 years of age.  You are older than 56 years of age and your health care provider tells you that you are at risk for this type of infection.  Your sexual activity has changed since you were last screened and you are at an increased risk for  chlamydia or gonorrhea. Ask your health care provider if you are at risk.  If you do not have HIV, but are at risk, it may be recommended that you take a prescription medicine daily to prevent HIV infection. This is called pre-exposure prophylaxis (PrEP). You are considered at risk if:  You are sexually active and do not regularly use condoms or know the HIV status of your partner(s).  You take drugs by injection.  You are sexually active with a partner who  has HIV. Talk with your health care provider about whether you are at high risk of being infected with HIV. If you choose to begin PrEP, you should first be tested for HIV. You should then be tested every 3 months for as long as you are taking PrEP.  PREGNANCY   If you are premenopausal and you may become pregnant, ask your health care provider about preconception counseling.  If you may become pregnant, take 400 to 800 micrograms (mcg) of folic acid every day.  If you want to prevent pregnancy, talk to your health care provider about birth control (contraception). OSTEOPOROSIS AND MENOPAUSE   Osteoporosis is a disease in which the bones lose minerals and strength with aging. This can result in serious bone fractures. Your risk for osteoporosis can be identified using a bone density scan.  If you are 49 years of age or older, or if you are at risk for osteoporosis and fractures, ask your health care provider if you should be screened.  Ask your health care provider whether you should take a calcium or vitamin D supplement to lower your risk for osteoporosis.  Menopause may have certain physical symptoms and risks.  Hormone replacement therapy may reduce some of these symptoms and risks. Talk to your health care provider about whether hormone replacement therapy is right for you.  HOME CARE INSTRUCTIONS   Schedule regular health, dental, and eye exams.  Stay current with your immunizations.   Do not use any tobacco products  including cigarettes, chewing tobacco, or electronic cigarettes.  If you are pregnant, do not drink alcohol.  If you are breastfeeding, limit how much and how often you drink alcohol.  Limit alcohol intake to no more than 1 drink per day for nonpregnant women. One drink equals 12 ounces of beer, 5 ounces of wine, or 1 ounces of hard liquor.  Do not use street drugs.  Do not share needles.  Ask your health care provider for help if you need support or information about quitting drugs.  Tell your health care provider if you often feel depressed.  Tell your health care provider if you have ever been abused or do not feel safe at home.   This information is not intended to replace advice given to you by your health care provider. Make sure you discuss any questions you have with your health care provider.   Document Released: 04/22/2011 Document Revised: 10/28/2014 Document Reviewed: 09/08/2013 Elsevier Interactive Patient Education 2016 [place] DASH stands for "Dietary Approaches to Stop Hypertension." The DASH eating plan is a healthy eating plan that has been shown to reduce high blood pressure (hypertension). Additional health benefits may include reducing the risk of type 2 diabetes mellitus, heart disease, and stroke. The DASH eating plan may also help with weight loss. WHAT DO I NEED TO KNOW ABOUT THE DASH EATING PLAN? For the DASH eating plan, you will follow these general guidelines:  Choose foods with a percent daily value for sodium of less than 5% (as listed on the food label).  Use salt-free seasonings or herbs instead of table salt or sea salt.  Check with your health care provider or pharmacist before using salt substitutes.  Eat lower-sodium products, often labeled as "lower sodium" or "no salt added."  Eat fresh foods.  Eat more vegetables, fruits, and low-fat dairy products.  Choose whole grains. Look for the word "whole" as the first word  in the ingredient list.  Choose fish and  skinless chicken or [place] more often than red meat. Limit fish, poultry, and meat to 6 oz (170 g) each day.  Limit sweets, desserts, sugars, and sugary drinks.  Choose heart-healthy fats.  Limit cheese to 1 oz (28 g) per day.  Eat more home-cooked food and less restaurant, buffet, and fast food.  Limit fried foods.  Cook foods using methods other than frying.  Limit canned vegetables. If you do use them, rinse them well to decrease the sodium.  When eating at a restaurant, ask that your food be prepared with less salt, or no salt if possible. WHAT FOODS CAN I EAT? Seek help from a dietitian for individual calorie needs. Grains Whole grain or whole wheat bread. Brown rice. Whole grain or whole wheat pasta. Quinoa, bulgur, and whole grain cereals. Low-sodium cereals. Corn or whole wheat flour tortillas. Whole grain cornbread. Whole grain crackers. Low-sodium crackers. Vegetables Fresh or frozen vegetables (raw, steamed, roasted, or grilled). Low-sodium or reduced-sodium tomato and vegetable juices. Low-sodium or reduced-sodium tomato sauce and paste. Low-sodium or reduced-sodium canned vegetables.  Fruits All fresh, canned (in natural juice), or frozen fruits. Meat and Other Protein Products Ground beef (85% or leaner), grass-fed beef, or beef trimmed of fat. Skinless chicken or [place]. Ground chicken or [place]. Pork trimmed of fat. All fish and seafood. Eggs. Dried beans, peas, or lentils. Unsalted nuts and seeds. Unsalted canned beans. Dairy Low-fat dairy products, such as skim or 1% milk, 2% or reduced-fat cheeses, low-fat ricotta or cottage cheese, or plain low-fat yogurt. Low-sodium or reduced-sodium cheeses. Fats and Oils Tub margarines without trans fats. Light or reduced-fat mayonnaise and salad dressings (reduced sodium). Avocado. Safflower, olive, or canola oils. Natural peanut or almond butter. Other Unsalted popcorn and  pretzels. The items listed above may not be a complete list of recommended foods or beverages. Contact your dietitian for more options. WHAT FOODS ARE NOT RECOMMENDED? Grains White bread. White pasta. White rice. Refined cornbread. Bagels and croissants. Crackers that contain trans fat. Vegetables Creamed or fried vegetables. Vegetables in a cheese sauce. Regular canned vegetables. Regular canned tomato sauce and paste. Regular tomato and vegetable juices. Fruits Dried fruits. Canned fruit in light or heavy syrup. Fruit juice. Meat and Other Protein Products Fatty cuts of meat. Ribs, chicken wings, bacon, sausage, bologna, salami, chitterlings, fatback, hot dogs, bratwurst, and packaged luncheon meats. Salted nuts and seeds. Canned beans with salt. Dairy Whole or 2% milk, cream, half-and-half, and cream cheese. Whole-fat or sweetened yogurt. Full-fat cheeses or blue cheese. Nondairy creamers and whipped toppings. Processed cheese, cheese spreads, or cheese curds. Condiments Onion and garlic salt, seasoned salt, table salt, and sea salt. Canned and packaged gravies. Worcestershire sauce. Tartar sauce. Barbecue sauce. Teriyaki sauce. Soy sauce, including reduced sodium. Steak sauce. Fish sauce. Oyster sauce. Cocktail sauce. Horseradish. Ketchup and mustard. Meat flavorings and tenderizers. Bouillon cubes. Hot sauce. Tabasco sauce. Marinades. Taco seasonings. Relishes. Fats and Oils Butter, stick margarine, lard, shortening, ghee, and bacon fat. Coconut, palm kernel, or palm oils. Regular salad dressings. Other Pickles and olives. Salted popcorn and pretzels. The items listed above may not be a complete list of foods and beverages to avoid. Contact your dietitian for more information. WHERE CAN I FIND MORE INFORMATION? National Heart, Lung, and Blood Institute: travelstabloid.com   This information is not intended to replace advice given to you by your health care  provider. Make sure you discuss any questions you have with your health care provider.   Document Released: 09/26/2011  Document Revised: 10/28/2014 Document Reviewed: 08/11/2013 Elsevier Interactive Patient Education [employer].

## 2016-03-30 ENCOUNTER — Telehealth: Payer: Self-pay | Admitting: Family Medicine

## 2016-03-30 LAB — CBC WITH DIFFERENTIAL/PLATELET
BASOS ABS: 0.1 10*3/uL (ref 0.0–0.2)
Basos: 1 %
EOS (ABSOLUTE): 0.5 10*3/uL — AB (ref 0.0–0.4)
Eos: 8 %
HEMATOCRIT: 36.5 % (ref 34.0–46.6)
Hemoglobin: 12.8 g/dL (ref 11.1–15.9)
IMMATURE GRANULOCYTES: 0 %
Immature Grans (Abs): 0 10*3/uL (ref 0.0–0.1)
LYMPHS ABS: 2.9 10*3/uL (ref 0.7–3.1)
Lymphs: 45 %
MCH: 26.2 pg — ABNORMAL LOW (ref 26.6–33.0)
MCHC: 35.1 g/dL (ref 31.5–35.7)
MCV: 75 fL — ABNORMAL LOW (ref 79–97)
MONOS ABS: 0.4 10*3/uL (ref 0.1–0.9)
Monocytes: 6 %
NEUTROS PCT: 40 %
Neutrophils Absolute: 2.6 10*3/uL (ref 1.4–7.0)
PLATELETS: 285 10*3/uL (ref 150–379)
RBC: 4.89 x10E6/uL (ref 3.77–5.28)
RDW: 14.2 % (ref 12.3–15.4)
WBC: 6.5 10*3/uL (ref 3.4–10.8)

## 2016-03-30 LAB — COMPREHENSIVE METABOLIC PANEL
ALBUMIN: 4.7 g/dL (ref 3.5–5.5)
ALK PHOS: 80 IU/L (ref 39–117)
ALT: 26 IU/L (ref 0–32)
AST: 28 IU/L (ref 0–40)
Albumin/Globulin Ratio: 1.7 (ref 1.2–2.2)
BILIRUBIN TOTAL: 0.4 mg/dL (ref 0.0–1.2)
BUN / CREAT RATIO: 12 (ref 9–23)
BUN: 12 mg/dL (ref 6–24)
CHLORIDE: 98 mmol/L (ref 96–106)
CO2: 27 mmol/L (ref 18–29)
Calcium: 9.8 mg/dL (ref 8.7–10.2)
Creatinine, Ser: 1.02 mg/dL — ABNORMAL HIGH (ref 0.57–1.00)
GFR calc Af Amer: 72 mL/min/{1.73_m2} (ref >59)
GFR calc non Af Amer: 62 mL/min/{1.73_m2} (ref >59)
GLOBULIN, TOTAL: 2.8 g/dL (ref 1.5–4.5)
GLUCOSE: 106 mg/dL — AB (ref 65–99)
Potassium: 4.1 mmol/L (ref 3.5–5.2)
SODIUM: 141 mmol/L (ref 134–144)
Total Protein: 7.5 g/dL (ref 6.0–8.5)

## 2016-03-30 LAB — LIPID PANEL W/O CHOL/HDL RATIO
CHOLESTEROL TOTAL: 151 mg/dL (ref 100–199)
HDL: 51 mg/dL (ref >39)
LDL Calculated: 72 mg/dL (ref 0–99)
TRIGLYCERIDES: 142 mg/dL (ref 0–149)
VLDL Cholesterol Cal: 28 mg/dL (ref 5–40)

## 2016-03-30 NOTE — Telephone Encounter (Signed)
RBCs are microcytic, hypochromic without anemia Glucose just a bit up (fasting?) Creatinine just a bit up; avoid NSAIDs, need to STOP ibuprofen

## 2016-04-02 NOTE — Telephone Encounter (Signed)
I left msg

## 2016-04-05 ENCOUNTER — Telehealth: Payer: Self-pay | Admitting: Family Medicine

## 2016-04-05 MED ORDER — ESTRADIOL 0.1 MG/GM VA CREA: 1.0000 | TOPICAL_CREAM | VAGINAL | Status: DC

## 2016-04-05 NOTE — Telephone Encounter (Signed)
I spoke with patient; she was not fasting, so no worries; MCV was 76 three years ago, 75 now; she says her cells have always been small and pale she thinks; Cr up just a smidge; stop any NSAIDs She asked for refill of estrace; not in med list; she says she had it in her bag and showed the staff when she was here in May; I apologized for our omission; she was able to tell me what she takes and I am sending refill now She has not heard from anyone about the GI referral entered on May 19th; I'll ask staff to check on that She can call to schedule her own mammogram

## 2016-04-05 NOTE — Telephone Encounter (Signed)
Please check on GI referral from May and communicate with patient Patient says she has not heard anything from Dr. Percell [place] office Thank you

## 2016-04-08 NOTE — Telephone Encounter (Signed)
Pt was given info to call and setup

## 2016-07-29 ENCOUNTER — Other Ambulatory Visit: Payer: Self-pay

## 2016-07-29 DIAGNOSIS — E785 Hyperlipidemia, unspecified: Secondary | ICD-10-CM

## 2016-07-29 DIAGNOSIS — I10 Essential (primary) hypertension: Secondary | ICD-10-CM

## 2016-07-29 MED ORDER — SIMVASTATIN 40 MG PO TABS: 40.0000 mg | ORAL_TABLET | Freq: Every day | ORAL | 1 refills | Status: DC

## 2016-07-29 MED ORDER — BENICAR HCT 40-12.5 MG PO TABS: 1.0000 | ORAL_TABLET | Freq: Every day | ORAL | 1 refills | Status: DC

## 2016-07-29 NOTE — Telephone Encounter (Signed)
Sgpt, lipids, Cr, K+ reviewed; rxs approved

## 2016-09-09 ENCOUNTER — Encounter: Payer: Self-pay | Admitting: Family Medicine

## 2016-09-09 ENCOUNTER — Ambulatory Visit (INDEPENDENT_AMBULATORY_CARE_PROVIDER_SITE_OTHER): Payer: 59 | Admitting: Family Medicine

## 2016-09-09 VITALS — BP 110/70 | HR 84 | Temp 98.1°F | Resp 14 | Wt 167.0 lb

## 2016-09-09 DIAGNOSIS — G8929 Other chronic pain: Secondary | ICD-10-CM

## 2016-09-09 DIAGNOSIS — M47816 Spondylosis without myelopathy or radiculopathy, lumbar region: Secondary | ICD-10-CM | POA: Diagnosis not present

## 2016-09-09 DIAGNOSIS — G4700 Insomnia, unspecified: Secondary | ICD-10-CM | POA: Diagnosis not present

## 2016-09-09 DIAGNOSIS — M25551 Pain in right hip: Secondary | ICD-10-CM

## 2016-09-09 DIAGNOSIS — I10 Essential (primary) hypertension: Secondary | ICD-10-CM | POA: Diagnosis not present

## 2016-09-09 DIAGNOSIS — R200 Anesthesia of skin: Secondary | ICD-10-CM | POA: Diagnosis not present

## 2016-09-09 DIAGNOSIS — E785 Hyperlipidemia, unspecified: Secondary | ICD-10-CM | POA: Diagnosis not present

## 2016-09-09 DIAGNOSIS — R1031 Right lower quadrant pain: Secondary | ICD-10-CM | POA: Diagnosis not present

## 2016-09-09 DIAGNOSIS — Z5181 Encounter for therapeutic drug level monitoring: Secondary | ICD-10-CM | POA: Diagnosis not present

## 2016-09-09 DIAGNOSIS — Z23 Encounter for immunization: Secondary | ICD-10-CM | POA: Diagnosis not present

## 2016-09-09 MED ORDER — LOSARTAN POTASSIUM-HCTZ 50-12.5 MG PO TABS: 1.0000 | ORAL_TABLET | Freq: Every day | ORAL | 3 refills | Status: DC

## 2016-09-09 NOTE — Assessment & Plan Note (Addendum)
Continue Benicar-HCT; check creatinine and K+ today; switch agents because of insurance issues; patient to monitor her BP

## 2016-09-09 NOTE — Assessment & Plan Note (Signed)
Do not think this is diabetes; refer to ortho, as this sounds to be coming from hip/back

## 2016-09-09 NOTE — Assessment & Plan Note (Signed)
Chronic low back pain; turmeric, refer to ortho

## 2016-09-09 NOTE — Assessment & Plan Note (Signed)
Patient brought this up at the end of our full visit; I offered CT scan and will check labs, and bring her back

## 2016-09-09 NOTE — Assessment & Plan Note (Signed)
Continue trazodone

## 2016-09-09 NOTE — Patient Instructions (Addendum)
Try to limit saturated fats in your diet (bologna, hot dogs, barbeque, cheeseburgers, hamburgers, steak, bacon, sausage, cheese, etc.) and get more fresh fruits, vegetables, and whole grains Your goal blood pressure is less than 140 mmHg on top. Try to follow the DASH guidelines (DASH stands for Dietary Approaches to Stop Hypertension) Try to limit the sodium in your diet.  Ideally, consume less than 1.5 grams (less than 1,550m) per day. Do not add salt when cooking or at the table.  Check the sodium amount on labels when shopping, and choose items lower in sodium when given a choice. Avoid or limit foods that already contain a lot of sodium. Eat a diet rich in fruits and vegetables and whole grains. Check out the information at familydoctor.org entitled "Nutrition for Weight Loss: What You Need to Know about Fad Diets" Try to lose between 1-2 pounds per week by taking in fewer calories and burning off more calories You can succeed by limiting portions, limiting foods dense in calories and fat, becoming more active, and drinking 8 glasses of water a day (64 ounces) Don't skip meals, especially breakfast, as skipping meals may alter your metabolism Do not use over-the-counter weight loss pills or gimmicks that claim rapid weight loss A healthy BMI (or body mass index) is between 18.5 and 24.9 You can calculate your ideal BMI at the NHolleywebsite hClubMonetize.frTry turmeric as a natural anti-inflammatory (for pain and arthritis). It comes in capsules where you buy aspirin and fish oil, but also as a spice where you buy pepper and garlic powder. I've ordered a CT scan and will ask you to come back for more discussion of your abdominal pain We'll have you see the orthopaedist about your back, hip, and foot

## 2016-09-09 NOTE — Assessment & Plan Note (Signed)
Refer to ortho; turmeric recommended

## 2016-09-09 NOTE — Assessment & Plan Note (Signed)
Check lipids today; encouraged her to decrease saturated fat intake; work on weight loss, get more whole grains and fiber

## 2016-09-09 NOTE — Progress Notes (Signed)
BP 110/70   Pulse 84   Temp 98.1 F (36.7 C)   Resp 14   Wt 167 lb (75.8 kg)   SpO2 96%   BMI 29.58 kg/m    Subjective:    Patient ID: Samantha Stephens, female    DOB: 31-May-1961, 55 y.o.   MRN: [id number]  HPI: Samantha Stephens is a 55 y.o. female  Chief Complaint  Patient presents with  . Follow-up    6 month   . Back Pain  . Hip Pain    right side  . Foot Pain    right foot numbness   Patient is here for a 6 month follow-up, but has several things that are also bothering her  She has hypertension, for which she takes Benicar-HCT; she ha been doing really good, really trying to work hard; limiting salt on her food, using seasoning instead; insurance company says they are not going to cover her Benicar-HCT; was seeing Dr. Rutherford Nail; she had to take something else once which made the lips swell  She has high cholesterol and is taking simvastatin; no muscle aches, but that's from her back; eating 4-5 eggs a week; doesn't eat many hot dogs; occasional bologna; bacon once a week; rarely eats sausage  Insomnia; trying to sleep better; walking   She c/o back pain, and has degenerative arthritis of the lumbar spine; aunt had severe arthritis; mother had arthritis  She cannot feel the lateral distal RIGHT heel; going on for two months; thinks it is related to the back; she has to charge her gait; every night her back hurts, but the right hip locking up  Pain down low in the RLQ; not looking at stool; last colonoscopy was years ago; she was asked at the last visit to check on colonoscopy; pt called Dr. Vira Agar, and he said not due for a colonoscopy; on and off, wonders if that's her back; no blood in urine  Depression screen [facility] 2/9 09/09/2016 03/08/2016 08/18/2015  Decreased Interest 0 0 0  Down, Depressed, Hopeless 0 0 0  PHQ - 2 Score 0 0 0   Relevant past medical, surgical, family and social history reviewed Past Medical History:  Diagnosis Date  . Arthritis   .  Chronic mid back pain 08/18/2015  . 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 Family hx of colon cancer requiring screening colonoscopy 03/08/2016  . Hyperlipidemia   . Hypertension   . Scoliosis 03/08/2016  . Stress headaches    Past Surgical History:  Procedure Laterality Date  . ABDOMINAL HYSTERECTOMY    . CESAREAN SECTION     1   Family History  Problem Relation Age of Onset  . Arthritis Mother   . COPD Mother   . Depression Mother   . Diabetes Mother   . Hyperlipidemia Mother   . Hypertension Mother   . Cataracts Mother   . Heart disease Mother   . Cancer Father   . Hearing loss Father   . Kidney disease Father   . Cataracts Father   . Asthma Sister   . Cancer Paternal Grandmother   . Mental illness Sister   MD note: mother died last month; had a heart attack  Social History  Substance Use Topics  . Smoking status: Former Research scientist (life sciences)  . Smokeless tobacco: Never Used     Comment: but only for a short amount of time  . Alcohol use Not on file     Comment: occasional glass of  wine   Interim medical history since last visit reviewed. Allergies and medications reviewed  Review of Systems Per HPI unless specifically indicated above     Objective:    BP 110/70   Pulse 84   Temp 98.1 F (36.7 C)   Resp 14   Wt 167 lb (75.8 kg)   SpO2 96%   BMI 29.58 kg/m   Wt Readings from Last 3 Encounters:  09/09/16 167 lb (75.8 kg)  03/08/16 165 lb 7 oz (75 kg)  10/27/15 166 lb 14.4 oz (75.7 kg)    Physical Exam  Constitutional: She appears well-developed and well-nourished. No distress.  HENT:  Head: Normocephalic and atraumatic.  Eyes: EOM are normal. No scleral icterus.  Neck: No thyromegaly present.  Cardiovascular: Normal rate, regular rhythm and normal heart sounds.   No murmur heard. Pulmonary/Chest: Effort normal and breath sounds normal. No respiratory distress. She has no wheezes.  Abdominal: Soft. Bowel sounds are normal. She exhibits no  distension.  Musculoskeletal: Normal range of motion. She exhibits no edema.  Neurological: She is alert. She exhibits normal muscle tone.  Skin: Skin is warm and dry. She is not diaphoretic. No pallor.  Psychiatric: She has a normal mood and affect. Her behavior is normal. Judgment and thought content normal.    Results for orders placed or performed in visit on 03/08/16  Comprehensive metabolic panel  Result Value Ref Range   Glucose 106 (H) 65 - 99 mg/dL   BUN 12 6 - 24 mg/dL   Creatinine, Ser 1.02 (H) 0.57 - 1.00 mg/dL   GFR calc non Af Amer 62 >59 mL/min/1.73   GFR calc Af Amer 72 >59 mL/min/1.73   BUN/Creatinine Ratio 12 9 - 23   Sodium 141 134 - 144 mmol/L   Potassium 4.1 3.5 - 5.2 mmol/L   Chloride 98 96 - 106 mmol/L   CO2 27 18 - 29 mmol/L   Calcium 9.8 8.7 - 10.2 mg/dL   Total Protein 7.5 6.0 - 8.5 g/dL   Albumin 4.7 3.5 - 5.5 g/dL   Globulin, Total 2.8 1.5 - 4.5 g/dL   Albumin/Globulin Ratio 1.7 1.2 - 2.2   Bilirubin Total 0.4 0.0 - 1.2 mg/dL   Alkaline Phosphatase 80 39 - 117 IU/L   AST 28 0 - 40 IU/L   ALT 26 0 - 32 IU/L  Lipid Panel w/o Chol/HDL Ratio  Result Value Ref Range   Cholesterol, Total 151 100 - 199 mg/dL   Triglycerides 142 0 - 149 mg/dL   HDL 51 >39 mg/dL   VLDL Cholesterol Cal 28 5 - 40 mg/dL   LDL Calculated 72 0 - 99 mg/dL  CBC with Differential/Platelet  Result Value Ref Range   WBC 6.5 3.4 - 10.8 x10E3/uL   RBC 4.89 3.77 - 5.28 x10E6/uL   Hemoglobin 12.8 11.1 - 15.9 g/dL   Hematocrit 36.5 34.0 - 46.6 %   MCV 75 (L) 79 - 97 fL   MCH 26.2 (L) 26.6 - 33.0 pg   MCHC 35.1 31.5 - 35.7 g/dL   RDW 14.2 12.3 - 15.4 %   Platelets 285 150 - 379 x10E3/uL   Neutrophils 40 %   Lymphs 45 %   Monocytes 6 %   Eos 8 %   Basos 1 %   Neutrophils Absolute 2.6 1.4 - 7.0 x10E3/uL   Lymphocytes Absolute 2.9 0.7 - 3.1 x10E3/uL   Monocytes Absolute 0.4 0.1 - 0.9 x10E3/uL   EOS (ABSOLUTE) 0.5 (H) 0.0 -  0.4 x10E3/uL   Basophils Absolute 0.1 0.0 - 0.2  x10E3/uL   Immature Granulocytes 0 %   Immature Grans (Abs) 0.0 0.0 - 0.1 x10E3/uL      Assessment & Plan:   Problem List Items Addressed This Visit      Cardiovascular and Mediastinum   Hypertension goal BP (blood pressure) < 140/90    Continue Benicar-HCT; check creatinine and K+ today; switch agents because of insurance issues; patient to monitor her BP      Relevant Medications   aspirin EC 81 MG tablet   losartan-hydrochlorothiazide (HYZAAR) 50-12.5 MG tablet     Musculoskeletal and Integument   Degenerative arthritis of lumbar spine    Chronic low back pain; turmeric, refer to ortho      Relevant Medications   aspirin EC 81 MG tablet   Other Relevant Orders   Ambulatory referral to Orthopedic Surgery     Other   Right lower quadrant abdominal pain    Patient brought this up at the end of our full visit; I offered CT scan and will check labs, and bring her back      Relevant Orders   Urinalysis w microscopic + reflex cultur   CT Abdomen Pelvis W Contrast   Numbness of right foot    Do not think this is diabetes; refer to ortho, as this sounds to be coming from hip/back      Relevant Orders   Ambulatory referral to Orthopedic Surgery   Medication monitoring encounter    Monitor Cr and K+ on the ARB-thiazide combo, and the SGPT on the statin      Relevant Orders   COMPLETE METABOLIC PANEL WITH GFR   Insomnia, controlled    Continue trazodone      Hyperlipidemia LDL goal <100 - Primary    Check lipids today; encouraged her to decrease saturated fat intake; work on weight loss, get more whole grains and fiber      Relevant Medications   aspirin EC 81 MG tablet   losartan-hydrochlorothiazide (HYZAAR) 50-12.5 MG tablet   Other Relevant Orders   Lipid panel   Chronic hip pain, right    Refer to ortho; turmeric recommended      Relevant Orders   Ambulatory referral to Orthopedic Surgery    Other Visit Diagnoses    Needs flu shot       Relevant Orders    Flu Vaccine QUAD 36+ mos PF IM (Fluarix & Fluzone Quad PF) (Completed)      Follow up plan: Return in about 2 weeks (around 09/23/2016) for abdominal pain; 6 months for regular follow-up.  An after-visit summary was printed and given to the patient at [place].  Please see the patient instructions which may contain other information and recommendations beyond what is mentioned above in the assessment and plan.  Meds ordered this encounter  Medications  . tobramycin-dexamethasone (TOBRADEX) ophthalmic solution  . Multiple Vitamin (MULTIVITAMIN) tablet    Sig: Take 1 tablet by mouth daily.  Marland Kitchen aspirin EC 81 MG tablet    Sig: Take 1 tablet (81 mg total) by mouth daily.  Marland Kitchen losartan-hydrochlorothiazide (HYZAAR) 50-12.5 MG tablet    Sig: Take 1 tablet by mouth daily. (this replaces Benicar-HCT)    Dispense:  90 tablet    Refill:  3    Orders Placed This Encounter  Procedures  . CT Abdomen Pelvis W Contrast  . Flu Vaccine QUAD 36+ mos PF IM (Fluarix & Fluzone Quad PF)  .  Lipid panel  . COMPLETE METABOLIC PANEL WITH GFR  . Urinalysis w microscopic + reflex cultur  . Ambulatory referral to Orthopedic Surgery

## 2016-09-09 NOTE — Assessment & Plan Note (Signed)
Monitor Cr and K+ on the ARB-thiazide combo, and the SGPT on the statin

## 2016-09-10 ENCOUNTER — Ambulatory Visit: Payer: 59 | Admitting: Family Medicine

## 2016-09-10 LAB — URINALYSIS, ROUTINE W REFLEX MICROSCOPIC
Bilirubin, UA: NEGATIVE
Glucose, UA: NEGATIVE
Ketones, UA: NEGATIVE
Leukocytes, UA: NEGATIVE
Nitrite, UA: NEGATIVE
PH UA: 6.5 (ref 5.0–7.5)
PROTEIN UA: NEGATIVE
Specific Gravity, UA: 1.008 (ref 1.005–1.030)
UUROB: 0.2 mg/dL (ref 0.2–1.0)

## 2016-09-10 LAB — MICROSCOPIC EXAMINATION
CASTS: NONE SEEN /LPF
Epithelial Cells (non renal): NONE SEEN /hpf (ref 0–10)

## 2016-09-10 LAB — COMPREHENSIVE METABOLIC PANEL
A/G RATIO: 1.7 (ref 1.2–2.2)
ALBUMIN: 4.5 g/dL (ref 3.5–5.5)
ALT: 21 IU/L (ref 0–32)
AST: 22 IU/L (ref 0–40)
Alkaline Phosphatase: 78 IU/L (ref 39–117)
BUN / CREAT RATIO: 16 (ref 9–23)
BUN: 14 mg/dL (ref 6–24)
CALCIUM: 9.5 mg/dL (ref 8.7–10.2)
CHLORIDE: 102 mmol/L (ref 96–106)
CO2: 22 mmol/L (ref 18–29)
Creatinine, Ser: 0.9 mg/dL (ref 0.57–1.00)
GFR, EST AFRICAN AMERICAN: 83 mL/min/{1.73_m2} (ref >59)
GFR, EST NON AFRICAN AMERICAN: 72 mL/min/{1.73_m2} (ref >59)
GLOBULIN, TOTAL: 2.7 g/dL (ref 1.5–4.5)
Glucose: 98 mg/dL (ref 65–99)
POTASSIUM: 3.9 mmol/L (ref 3.5–5.2)
Sodium: 143 mmol/L (ref 134–144)
TOTAL PROTEIN: 7.2 g/dL (ref 6.0–8.5)

## 2016-09-10 LAB — LIPID PANEL
CHOLESTEROL TOTAL: 158 mg/dL (ref 100–199)
Chol/HDL Ratio: 3.3 ratio units (ref 0.0–4.4)
HDL: 48 mg/dL (ref >39)
LDL Calculated: 66 mg/dL (ref 0–99)
TRIGLYCERIDES: 221 mg/dL — AB (ref 0–149)
VLDL Cholesterol Cal: 44 mg/dL — ABNORMAL HIGH (ref 5–40)

## 2016-09-18 ENCOUNTER — Ambulatory Visit
Admission: RE | Admit: 2016-09-18 | Discharge: 2016-09-18 | Disposition: A | Discharge status: Home / Self Care | Payer: 59 | Source: Ambulatory Visit | Attending: Family Medicine | Admitting: Family Medicine

## 2016-09-18 DIAGNOSIS — R1031 Right lower quadrant pain: Secondary | ICD-10-CM | POA: Insufficient documentation

## 2016-09-18 MED ORDER — IOPAMIDOL (ISOVUE-300) INJECTION 61%
100.0000 mL | Freq: Once | INTRAVENOUS | Status: AC | PRN
  Administered 2016-09-18: 100 mL via INTRAVENOUS

## 2016-09-20 ENCOUNTER — Ambulatory Visit
Admission: RE | Admit: 2016-09-20 | Discharge: 2016-09-20 | Disposition: A | Discharge status: Home / Self Care | Payer: 59 | Source: Ambulatory Visit | Attending: Family Medicine | Admitting: Family Medicine

## 2016-09-20 DIAGNOSIS — Z1231 Encounter for screening mammogram for malignant neoplasm of breast: Secondary | ICD-10-CM | POA: Diagnosis not present

## 2016-09-20 DIAGNOSIS — Z1239 Encounter for other screening for malignant neoplasm of breast: Secondary | ICD-10-CM

## 2016-09-23 ENCOUNTER — Encounter: Payer: Self-pay | Admitting: Family Medicine

## 2016-09-23 ENCOUNTER — Ambulatory Visit (INDEPENDENT_AMBULATORY_CARE_PROVIDER_SITE_OTHER): Payer: 59 | Admitting: Family Medicine

## 2016-09-23 DIAGNOSIS — M47816 Spondylosis without myelopathy or radiculopathy, lumbar region: Secondary | ICD-10-CM | POA: Diagnosis not present

## 2016-09-23 DIAGNOSIS — I10 Essential (primary) hypertension: Secondary | ICD-10-CM | POA: Diagnosis not present

## 2016-09-23 NOTE — Progress Notes (Signed)
BP 110/74 (BP Location: Left Arm, Patient Position: Sitting, Cuff Size: Normal)   Pulse 98   Temp 99.1 F (37.3 C) (Oral)   Resp 16   Ht 5' 3"  (1.6 m)   Wt 167 lb 9 oz (76 kg)   SpO2 95%   BMI 29.68 kg/m    Subjective:    Patient ID: Samantha Stephens, female    DOB: [birth year], 54 y.o.   MRN: [id number]  HPI: Samantha Stephens is a 54 y.o. female  Chief Complaint  Patient presents with  . Follow-up    Addominal pain F/U   Thinks her pain is coming from her back; hurts along the right flank, goes down to the leg Ankle is numb on the bottom Back hurts more when laying down sleeping; when she wakes up, she is stiff in the mornings Older sister has neck issues; has had surgery on her neck and shoulder Mother had arthritis She has not tried turmeric; she does not like taking anything Doing some stretching but has to sit at work Seeing back doctor on Dec 13th ------------------------------ We reviewed imaging done last month, CT scan of abdomen and pelvis Sep 18, 2016  IMPRESSION: No significant abnormality seen in the abdomen or pelvis.  Electronically Signed   By: Marijo Conception, M.D.   On: 09/18/2016 16:18 ---------------------------------  Depression screen [facility] 2/9 09/23/2016 09/09/2016 03/08/2016 08/18/2015  Decreased Interest 0 0 0 0  Down, Depressed, Hopeless 0 0 0 0  PHQ - 2 Score 0 0 0 0   Relevant past medical, surgical, family and social history reviewed Past Medical History:  Diagnosis Date  . Arthritis   . Chronic mid back pain 08/18/2015  . 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 Family hx of colon cancer requiring screening colonoscopy 03/08/2016  . Hyperlipidemia   . Hypertension   . Scoliosis 03/08/2016  . Stress headaches    Past Surgical History:  Procedure Laterality Date  . ABDOMINAL HYSTERECTOMY    . CESAREAN SECTION     1   Family History  Problem Relation Age of Onset  . Arthritis Mother   . COPD Mother   .  Depression Mother   . Diabetes Mother   . Hyperlipidemia Mother   . Hypertension Mother   . Cataracts Mother   . Heart disease Mother   . Cancer Father   . Hearing loss Father   . Kidney disease Father   . Cataracts Father   . Asthma Sister   . Cancer Paternal Grandmother   . Mental illness Sister   . Breast cancer Other    Social History  Substance Use Topics  . Smoking status: Former Research scientist (life sciences)  . Smokeless tobacco: Never Used     Comment: but only for a short amount of time  . Alcohol use Not on file     Comment: occasional glass of wine   Interim medical history since last visit reviewed. Allergies and medications reviewed  Review of Systems Per HPI unless specifically indicated above     Objective:    BP 110/74 (BP Location: Left Arm, Patient Position: Sitting, Cuff Size: Normal)   Pulse 98   Temp 99.1 F (37.3 C) (Oral)   Resp 16   Ht 5' 3"  (1.6 m)   Wt 167 lb 9 oz (76 kg)   SpO2 95%   BMI 29.68 kg/m   Wt Readings from Last 3 Encounters:  09/23/16 167 lb 9 oz (76 kg)  09/09/16 167 lb (75.8 kg)  03/08/16 165 lb 7 oz (75 kg)    Physical Exam  Constitutional: She appears well-developed and well-nourished. No distress.  Cardiovascular: Normal rate.   Pulmonary/Chest: Effort normal.  Musculoskeletal: She exhibits no edema.       Lumbar back: She exhibits decreased range of motion and pain. She exhibits no bony tenderness, no swelling, no edema, no deformity and no spasm.  Neurological:  LE strength 5/5 bilaterally  Skin:  No vesicles or rash over the lower back to suggest shingles or PHN  Psychiatric: She has a normal mood and affect. Her behavior is normal.   Results for orders placed or performed in visit on 09/09/16  Microscopic Examination  Result Value Ref Range   WBC, UA 0-5 0 - 5 /hpf   RBC, UA 0-2 0 - 2 /hpf   Epithelial Cells (non renal) None seen 0 - 10 /hpf   Casts None seen None seen /lpf   Bacteria, UA Few None seen/Few  Lipid panel    Result Value Ref Range   Cholesterol, Total 158 100 - 199 mg/dL   Triglycerides 221 (H) 0 - 149 mg/dL   HDL 48 >39 mg/dL   VLDL Cholesterol Cal 44 (H) 5 - 40 mg/dL   LDL Calculated 66 0 - 99 mg/dL   Chol/HDL Ratio 3.3 0.0 - 4.4 ratio units  Comprehensive Metabolic Panel (CMET)  Result Value Ref Range   Glucose 98 65 - 99 mg/dL   BUN 14 6 - 24 mg/dL   Creatinine, Ser 0.90 0.57 - 1.00 mg/dL   GFR calc non Af Amer 72 >59 mL/min/1.73   GFR calc Af Amer 83 >59 mL/min/1.73   BUN/Creatinine Ratio 16 9 - 23   Sodium 143 134 - 144 mmol/L   Potassium 3.9 3.5 - 5.2 mmol/L   Chloride 102 96 - 106 mmol/L   CO2 22 18 - 29 mmol/L   Calcium 9.5 8.7 - 10.2 mg/dL   Total Protein 7.2 6.0 - 8.5 g/dL   Albumin 4.5 3.5 - 5.5 g/dL   Globulin, Total 2.7 1.5 - 4.5 g/dL   Albumin/Globulin Ratio 1.7 1.2 - 2.2   Bilirubin Total <0.2 0.0 - 1.2 mg/dL   Alkaline Phosphatase 78 39 - 117 IU/L   AST 22 0 - 40 IU/L   ALT 21 0 - 32 IU/L  Urinalysis, Routine w reflex microscopic  Result Value Ref Range   Specific Gravity, UA 1.008 1.005 - 1.030   pH, UA 6.5 5.0 - 7.5   Color, UA Yellow Yellow   Appearance Ur Clear Clear   Leukocytes, UA Negative Negative   Protein, UA Negative Negative/Trace   Glucose, UA Negative Negative   Ketones, UA Negative Negative   RBC, UA Trace (A) Negative   Bilirubin, UA Negative Negative   Urobilinogen, Ur 0.2 0.2 - 1.0 mg/dL   Nitrite, UA Negative Negative   Microscopic Examination See below:       Assessment & Plan:   Problem List Items Addressed This Visit      Cardiovascular and Mediastinum   Hypertension goal BP (blood pressure) < 140/90    Excellent control        Musculoskeletal and Integument   Degenerative arthritis of lumbar spine    Believe this is musculoskeletal; may have some mild nerve impingement; encouraged her to focus on ergonomics at work; she will see back doctor; urine negative, CT scan reassuring  Follow up plan: No Follow-up on  file.  An after-visit summary was printed and given to the patient at [place].  Please see the patient instructions which may contain other information and recommendations beyond what is mentioned above in the assessment and plan.

## 2016-09-23 NOTE — Patient Instructions (Signed)
I will be glad to write you a note for an adjustable computer height at work Let me know about any changes or problems

## 2016-09-28 NOTE — Assessment & Plan Note (Signed)
Believe this is musculoskeletal; may have some mild nerve impingement; encouraged her to focus on ergonomics at work; she will see back doctor; urine negative, CT scan reassuring

## 2016-09-28 NOTE — Assessment & Plan Note (Signed)
Excellent control

## 2016-11-12 ENCOUNTER — Telehealth: Payer: Self-pay

## 2016-11-12 DIAGNOSIS — R310 Gross hematuria: Secondary | ICD-10-CM

## 2016-11-12 NOTE — Telephone Encounter (Signed)
I talked with patient She is on nitrofurantoin BID Let's refer to urologist

## 2016-11-12 NOTE — Telephone Encounter (Signed)
Patient called and mention to me that she had to go to urgent care Sunday due to seeing blood when she wiped. She state  they saw blood in her urine and they sent it off to the lab.She's drinking water and cranberry juice but she is still seeing blood. They did give her antibiotics but she don't know what else to do. She seems very worried and little frustrated.  She ask if you could call her as well.

## 2016-11-12 NOTE — Assessment & Plan Note (Signed)
Refer to urologist 

## 2016-11-18 ENCOUNTER — Other Ambulatory Visit: Payer: Self-pay | Admitting: Radiology

## 2016-11-18 ENCOUNTER — Telehealth: Payer: Self-pay | Admitting: Radiology

## 2016-11-18 ENCOUNTER — Ambulatory Visit: Payer: 59 | Admitting: Urology

## 2016-11-18 ENCOUNTER — Encounter: Payer: Self-pay | Admitting: Urology

## 2016-11-18 VITALS — BP 144/88 | HR 93 | Ht 63.0 in | Wt 167.0 lb

## 2016-11-18 DIAGNOSIS — R310 Gross hematuria: Secondary | ICD-10-CM

## 2016-11-18 DIAGNOSIS — R3129 Other microscopic hematuria: Secondary | ICD-10-CM

## 2016-11-18 DIAGNOSIS — R109 Unspecified abdominal pain: Secondary | ICD-10-CM

## 2016-11-18 LAB — MICROSCOPIC EXAMINATION

## 2016-11-18 LAB — URINALYSIS, COMPLETE
BILIRUBIN UA: NEGATIVE
GLUCOSE, UA: NEGATIVE
Ketones, UA: NEGATIVE
Leukocytes, UA: NEGATIVE
Nitrite, UA: NEGATIVE
PH UA: 5.5 (ref 5.0–7.5)
PROTEIN UA: NEGATIVE
Specific Gravity, UA: 1.01 (ref 1.005–1.030)
UUROB: 0.2 mg/dL (ref 0.2–1.0)

## 2016-11-18 NOTE — Progress Notes (Signed)
11/18/2016 9:20 AM   Samantha Stephens August 12, 1961 [id number]  Referring provider: Arnetha Courser, MD [address] [place], [place] [address]  Chief Complaint  Patient presents with  . Hematuria    New Patient    HPI: 56 yo F who resents for further evaluation of gross hematuria ~2 weeks ago.  [facility] and started on macrobid.  She had no acciated symptoms.    She denies any associated symptoms including frequency, urgency.  No n/v, fevers, chills.    Her urine cleared up spontaneously a few days ago. She is not on any anticoagulation. She does take a daily baby aspirin.    She has had urinary tract infections in the remote past which were different from this and symptomatic with lower abdominal pressure, dysuria, and urgency.  She does have chronic left flank/ back pain and currently undergoing PT for this.    She has a very remote smoking history.  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    No personal history of kidney cancer.    Father has personal history of colon, kidney cancer, and prostate cancer.  He is still living.     PMH: Past Medical History:  Diagnosis Date  . Arthritis   . Chronic mid back pain 08/18/2015  . 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 Family hx of colon cancer requiring screening colonoscopy 03/08/2016  . Hyperlipidemia   . Hypertension   . Scoliosis 03/08/2016  . Stress headaches     Surgical History: Past Surgical History:  Procedure Laterality Date  . ABDOMINAL HYSTERECTOMY    . CESAREAN SECTION     1    Home Medications:  Allergies as of 11/18/2016   No Known Allergies     Medication List       Accurate as of 11/18/16  9:20 AM. Always use your most recent med list.          aspirin EC 81 MG tablet Take 1 tablet (81 mg total) by mouth daily.   cyclobenzaprine 10 MG tablet Commonly known as:  FLEXERIL Take 1 tablet by mouth 2 (two) times daily as needed.  Reported on 03/08/2016   estradiol 0.1 MG/GM vaginal cream Commonly known as:  ESTRACE VAGINAL Place 1 Applicatorful vaginally 2 (two) times a week.   losartan-hydrochlorothiazide 50-12.5 MG tablet Commonly known as:  HYZAAR Take 1 tablet by mouth daily. (this replaces Benicar-HCT)   multivitamin tablet Take 1 tablet by mouth daily.   simvastatin 40 MG tablet Commonly known as:  ZOCOR Take 1 tablet (40 mg total) by mouth at bedtime.   tobramycin-dexamethasone ophthalmic solution Commonly known as:  TOBRADEX   traZODone 50 MG tablet Commonly known as:  DESYREL Take 0.5-1 tablets (25-50 mg total) by mouth at bedtime as needed for sleep.       Allergies: No Known Allergies  Family History: Family History  Problem Relation Age of Onset  . Arthritis Mother   . COPD Mother   . Depression Mother   . Diabetes Mother   . Hyperlipidemia Mother   . Hypertension Mother   . Cataracts Mother   . Heart disease Mother   . Cancer Father     kidney and prostate cancer, colon cancer  . Hearing loss Father   . Kidney disease Father   . Cataracts Father   . Asthma Sister   . Cancer Paternal Grandmother   . Mental illness Sister   .  Breast cancer Other     Social History:  reports that she has quit smoking. She has never used smokeless tobacco. She reports that she does not use drugs. Her alcohol history is not on file.  ROS: UROLOGY Frequent Urination?: No Hard to postpone urination?: No Burning/pain with urination?: No Get up at night to urinate?: Yes Leakage of urine?: No Urine stream starts and stops?: No Trouble starting stream?: No Do you have to strain to urinate?: No Blood in urine?: Yes Urinary tract infection?: Yes Sexually transmitted disease?: No Injury to kidneys or bladder?: No Painful intercourse?: No Weak stream?: No Currently pregnant?: No Vaginal bleeding?: No Last menstrual period?: hysterectomy  Gastrointestinal Nausea?: No Vomiting?:  No Indigestion/heartburn?: Yes Diarrhea?: No Constipation?: No  Constitutional Fever: No Night sweats?: No Weight loss?: No Fatigue?: No  Skin Skin rash/lesions?: No Itching?: No  Eyes Blurred vision?: No Double vision?: No  Ears/Nose/Throat Sore throat?: No Sinus problems?: No  Hematologic/Lymphatic Swollen glands?: No Easy bruising?: No  Cardiovascular Leg swelling?: No Chest pain?: No  Respiratory Cough?: No Shortness of breath?: No  Endocrine Excessive thirst?: No  Musculoskeletal Back pain?: Yes Joint pain?: No  Neurological Headaches?: No Dizziness?: No  Psychologic Depression?: No Anxiety?: No  Physical Exam: BP (!) 144/88   Pulse 93   Ht 5' 3"  (1.6 m)   Wt 167 lb (75.8 kg)   BMI 29.58 kg/m   Constitutional:  Alert and oriented, No acute distress. HEENT: [place] AT, moist mucus membranes.  Trachea midline, no masses. Cardiovascular: No clubbing, cyanosis, or edema. Respiratory: Normal respiratory effort, no increased work of breathing. GI: Abdomen is soft, nontender, nondistended, no abdominal masses GU: No CVA tenderness.  Skin: No rashes, bruises or suspicious lesions. Neurologic: Grossly intact, no focal deficits, moving all 4 extremities. Psychiatric: Normal mood and affect.  Laboratory Data: Lab Results  Component Value Date   WBC 6.5 03/29/2016   HGB 13.5 10/19/2015   HCT 36.5 03/29/2016   MCV 75 (L) 03/29/2016   PLT 285 03/29/2016    Lab Results  Component Value Date   CREATININE 0.90 09/09/2016    Urinalysis UA reviewed, No evidence of microscopic hematuria.  Pertinent Imaging: CLINICAL DATA:  Right flank pain.  EXAM: CT ABDOMEN AND PELVIS WITH CONTRAST  TECHNIQUE: Multidetector CT imaging of the abdomen and pelvis was performed using the standard protocol following bolus administration of intravenous contrast.  CONTRAST:  13m ISOVUE-300 IOPAMIDOL (ISOVUE-300) INJECTION 61%  COMPARISON:  CT scan of April 06, 2007.  FINDINGS: Lower chest: No acute abnormality.  Hepatobiliary: No focal liver abnormality is seen. No gallstones, gallbladder wall thickening, or biliary dilatation.  Pancreas: Unremarkable. No pancreatic ductal dilatation or surrounding inflammatory changes.  Spleen: Normal in size without focal abnormality.  Adrenals/Urinary Tract: Adrenal glands are unremarkable. Kidneys are normal, without renal calculi, focal lesion, or hydronephrosis. Bladder is unremarkable.  Stomach/Bowel: Stomach is within normal limits. Appendix appears normal. No evidence of bowel wall thickening, distention, or inflammatory changes.  Vascular/Lymphatic: No significant vascular findings are present. No enlarged abdominal or pelvic lymph nodes.  Reproductive: Status posthysterectomy.  Ovaries are not visualized.  Other: No abdominal wall hernia or abnormality. No abdominopelvic ascites.  Musculoskeletal: No acute or significant osseous findings.  IMPRESSION: No significant abnormality seen in the abdomen or pelvis.   Electronically Signed   By: JMarijo Conception M.D.   On: 09/18/2016 16:18  CT scan personally reviewed today.  Assessment & Plan:    1. Gross hematuria  We discussed the differential diagnosis for gross hematuria including nephrolithiasis, renal or upper tract tumors, bladder stones, UTIs, or bladder tumors as well as undetermined etiologies.  Reviewing her CT scan, there is no evidence of obvious stone although no noncontrast study was performed. Additionally, there are no renal masses. Delayed images are incompletely.  Per AUA guidelines, I did recommend complete gross hematuria evaluation including upper tract cytology, possible urine cytology, and cystoscopy.   Options were reviewed including repeating CT scan with urogram, including noncontrast and delayed imaging as well as office cystoscopy. Alternatively, consider cystoscopy in the operating room  with bilateral retrograde pyelogram. She is most interested in the latter. We did also discuss the role of possible intervention if any pathology was identified. Risk and benefits of this were discussed in detail.  Preop urine culture.  - Urinalysis, Complete - CULTURE, URINE COMPREHENSIVE  2. Left flank pain Based on history and examination, do feel that this is musculoskeletal in nature.  Schedule cysto, bilateral retrogrades with possible intervention as needed  Hollice Espy, MD  [facility] [address] [place], [place] [address] [phone]

## 2016-11-18 NOTE — Telephone Encounter (Signed)
Notified pt of surgery scheduled with Dr Erlene Quan on 11/25/16, pre-admit testing appt on 11/20/16 @1 :28 & to call Friday prior to surgery for arrival time to [facility]. Advised pt to hold ASA 71m beginning immediately. Pt voices understanding.

## 2016-11-20 ENCOUNTER — Encounter
Admission: RE | Admit: 2016-11-20 | Discharge: 2016-11-20 | Disposition: A | Discharge status: Home / Self Care | Payer: 59 | Source: Ambulatory Visit | Attending: Urology | Admitting: Urology

## 2016-11-20 DIAGNOSIS — Z01812 Encounter for preprocedural laboratory examination: Secondary | ICD-10-CM | POA: Diagnosis present

## 2016-11-20 DIAGNOSIS — I10 Essential (primary) hypertension: Secondary | ICD-10-CM | POA: Insufficient documentation

## 2016-11-20 DIAGNOSIS — Z01810 Encounter for preprocedural cardiovascular examination: Secondary | ICD-10-CM | POA: Insufficient documentation

## 2016-11-20 HISTORY — DX: Sleep apnea, unspecified: G47.30

## 2016-11-20 LAB — CULTURE, URINE COMPREHENSIVE

## 2016-11-20 LAB — POTASSIUM: POTASSIUM: 3.8 mmol/L (ref 3.5–5.1)

## 2016-11-20 NOTE — Patient Instructions (Signed)
Your procedure is scheduled on: MONDAY November 25, 2016 Su procedimiento est programado para: Report to [facility] a: To find out your arrival time please call [phone] between 1PM - 3PM on Friday November 22, 2016 Para saber su hora de llegada por favor llame al ([place]:  Remember: Instructions that are not followed completely may result in serious medical risk, up to and including death, or upon the discretion of your surgeon and anesthesiologist your surgery may need to be rescheduled.  Recuerde: Las instrucciones que no se siguen completamente Heritage manager en un riesgo de salud grave, incluyendo hasta la [place] o a discrecin de su cirujano y Environmental health practitioner, su ciruga se puede posponer.   __X_ 1. Do not eat food or drink liquids after midnight. No gum chewing or hard candies.  No coma alimentos ni tome lquidos despus de la medianoche.  No mastique chicle ni caramelos  duros.     __X__ 2. No alcohol for 24 hours before or after surgery.    No tome alcohol durante las 24 horas antes ni despus de la [place].   __X__ 3. Bring all medications with you on the day of surgery if instructed. BRING ANY NEW MEDICATION    Lleve todos los medicamentos con usted el da de su ciruga si se le ha indicado as.   __X__ 4. Notify your doctor if there is any change in your medical condition (cold, fever,                             infections).    Informe a su mdico si hay algn cambio en su condicin mdica (resfriado, fiebre, infecciones).   Do not wear jewelry, make-up, hairpins, clips or nail polish.  No use joyas, maquillajes, pinzas/ganchos para el cabello ni esmalte de uas.  Do not wear lotions, powders, or perfumes. You may wear deodorant.  No use lociones, polvos o perfumes.  Puede usar desodorante.    Do not shave 48 hours prior to surgery. Men may shave face and neck.  No se afeite 48 horas  antes de la [place].  Los hombres pueden [employer] cara y el cuello.   Do not bring valuables to the hospital.   No lleve objetos [facility] is not responsible for any belongings or valuables.  [place] no se hace responsable de ningn tipo de pertenencias u objetos de Geographical information systems officer.               Contacts, dentures or bridgework may not be worn into surgery.  Los lentes de [place], las dentaduras postizas o puentes no se pueden usar en la [place].  Leave your suitcase in the car. After surgery it may be brought to your room.  Deje su maleta en el auto.  Despus de la ciruga podr traerla a su habitacin.  For patients admitted to the hospital, discharge time is determined by your treatment team.  Para los pacientes que sean ingresados al hospital, el tiempo en el cual se le dar de alta es determinado por su                equipo de [place].   Patients discharged the day of surgery will not be allowed to drive home. A los pacientes que se les da de alta el mismo da de la ciruga no se les  permitir conducir a casa.   Please read over the following fact sheets that you were given: Por favor [place] informacin que le dieron:      __X_ Take these medicines the morning of surgery with A SIP OF WATER:          [employer] medicinas la maana de la ciruga con UN SORBO DE AGUA:  1.NONE  2.   3.   4.       5.  6.  ____ Fleet Enema (as directed)          Enema de Fleet (segn lo indicado)    ____ Use CHG Soap as directed          Utilice el jabn de CHG segn lo indicado  ____ Use inhalers on the day of surgery          Use los inhaladores el da de la ciruga  ____ Stop metformin 2 days prior to surgery          Deje de tomar el metformin 2 das antes de la ciruga    ____ Take 1/2 of usual insulin dose the night before surgery and none on the morning of surgery           Tome la mitad de la dosis habitual de insulina la noche  antes de la [place] y no tome nada en la maana de la             ciruga  __X__ Stop Coumadin/Plavix/aspirin UNTIL AFTER SURGERY          Deje de tomar el Coumadin/Plavix/aspirina el da:  __X__ Stop Anti-inflammatories on IBUPROFEN, ADVIL, MOTRIN, ALEVE ANAPROX          Deje de tomar antiinflamatorios el da:   __X__ Stop supplements until after surgery            Deje de tomar suplementos hasta despus de la ciruga  _X_ Bring C-Pap to the hospital          Ebro al hospital

## 2016-11-21 ENCOUNTER — Other Ambulatory Visit: Payer: Self-pay

## 2016-11-21 ENCOUNTER — Emergency Department
Admission: EM | Admit: 2016-11-21 | Discharge: 2016-11-22 | Disposition: A | Discharge status: Home / Self Care | Payer: 59 | Attending: Emergency Medicine | Admitting: Emergency Medicine

## 2016-11-21 DIAGNOSIS — Z7982 Long term (current) use of aspirin: Secondary | ICD-10-CM | POA: Diagnosis not present

## 2016-11-21 DIAGNOSIS — I10 Essential (primary) hypertension: Secondary | ICD-10-CM | POA: Diagnosis not present

## 2016-11-21 DIAGNOSIS — Z87891 Personal history of nicotine dependence: Secondary | ICD-10-CM | POA: Insufficient documentation

## 2016-11-21 DIAGNOSIS — Z01810 Encounter for preprocedural cardiovascular examination: Secondary | ICD-10-CM | POA: Diagnosis not present

## 2016-11-21 DIAGNOSIS — Z79899 Other long term (current) drug therapy: Secondary | ICD-10-CM | POA: Diagnosis not present

## 2016-11-21 DIAGNOSIS — T43215A Adverse effect of selective serotonin and norepinephrine reuptake inhibitors, initial encounter: Secondary | ICD-10-CM | POA: Diagnosis not present

## 2016-11-21 DIAGNOSIS — T887XXA Unspecified adverse effect of drug or medicament, initial encounter: Secondary | ICD-10-CM | POA: Insufficient documentation

## 2016-11-21 DIAGNOSIS — R55 Syncope and collapse: Secondary | ICD-10-CM | POA: Diagnosis present

## 2016-11-21 DIAGNOSIS — Y829 Unspecified medical devices associated with adverse incidents: Secondary | ICD-10-CM | POA: Diagnosis not present

## 2016-11-21 DIAGNOSIS — T50905A Adverse effect of unspecified drugs, medicaments and biological substances, initial encounter: Secondary | ICD-10-CM

## 2016-11-21 LAB — GLUCOSE, CAPILLARY: GLUCOSE-CAPILLARY: 77 mg/dL (ref 65–99)

## 2016-11-21 NOTE — ED Triage Notes (Signed)
Per EMS: Patient had unwitnessed syncopal episode today, was unresponsive for 3 minutes and was incontinent of stool.  EMS attempted to stand patient upright and patient had another episode of near syncope

## 2016-11-22 ENCOUNTER — Telehealth: Payer: Self-pay | Admitting: Family Medicine

## 2016-11-22 ENCOUNTER — Other Ambulatory Visit: Payer: Self-pay | Admitting: Family Medicine

## 2016-11-22 LAB — URINALYSIS, COMPLETE (UACMP) WITH MICROSCOPIC
Bilirubin Urine: NEGATIVE
GLUCOSE, UA: NEGATIVE mg/dL
Hgb urine dipstick: NEGATIVE
Ketones, ur: NEGATIVE mg/dL
Leukocytes, UA: NEGATIVE
Nitrite: NEGATIVE
PROTEIN: NEGATIVE mg/dL
Specific Gravity, Urine: 1.018 (ref 1.005–1.030)
pH: 6 (ref 5.0–8.0)

## 2016-11-22 LAB — BASIC METABOLIC PANEL
Anion gap: 8 (ref 5–15)
BUN: 14 mg/dL (ref 6–20)
CALCIUM: 9.2 mg/dL (ref 8.9–10.3)
CO2: 30 mmol/L (ref 22–32)
CREATININE: 0.93 mg/dL (ref 0.44–1.00)
Chloride: 103 mmol/L (ref 101–111)
GFR calc Af Amer: >60 mL/min (ref >60)
GFR calc non Af Amer: >60 mL/min (ref >60)
GLUCOSE: 125 mg/dL — AB (ref 65–99)
Potassium: 3.7 mmol/L (ref 3.5–5.1)
Sodium: 141 mmol/L (ref 135–145)

## 2016-11-22 LAB — CBC
HCT: 37.9 % (ref 35.0–47.0)
HEMOGLOBIN: 13 g/dL (ref 12.0–16.0)
MCH: 25.7 pg — AB (ref 26.0–34.0)
MCHC: 34.3 g/dL (ref 32.0–36.0)
MCV: 75 fL — ABNORMAL LOW (ref 80.0–100.0)
PLATELETS: 266 10*3/uL (ref 150–440)
RBC: 5.05 MIL/uL (ref 3.80–5.20)
RDW: 14.4 % (ref 11.5–14.5)
WBC: 9.3 10*3/uL (ref 3.6–11.0)

## 2016-11-22 LAB — TROPONIN I: Troponin I: <0.03 ng/mL (ref <0.03)

## 2016-11-22 MED ORDER — SODIUM CHLORIDE 0.9 % IV BOLUS (SEPSIS)
1000.0000 mL | Freq: Once | INTRAVENOUS | Status: AC
  Administered 2016-11-22: 1000 mL via INTRAVENOUS

## 2016-11-22 NOTE — ED Provider Notes (Signed)
[facility] Emergency Department Provider Note   ____________________________________________   First MD Initiated Contact with Patient 11/22/16 0006     (approximate)  I have reviewed the triage vital signs and the nursing notes.   HISTORY  Chief Complaint Loss of Consciousness    HPI Samantha Stephens is a 56 y.o. female history of hypertension, high cholesterol, sleep apnea  Patient reports she was getting ready for bed this evening as normal, she took a trazodone which she has not taken in several months time to assist and falling asleep, she reports that about 20 minutes of taking the trazodone she began to feel lightheaded, somewhat confused, and then began experiencing nauseated feeling. She felt very tired, called for her husband came to her side and then as he was holding her she passed out. Husband lowered her to the ground, and she did urinate on herself, but there was no general shaking activity. She was then able to come to and was well oriented within about 30 seconds time. They called paramedics to evaluate, and on scene EMS was checking her around she began to experience a similar feeling as though she was going to pass out prompting her to come emergency room.  At no point did she express any chest pain. No headache. She did not fall or injure herself. At this time she reports she feels fine, no concerns are present other than wanting to drink some water.   Past Medical History:  Diagnosis Date  . Arthritis   . Chronic mid back pain 08/18/2015  . 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 Family hx of colon cancer requiring screening colonoscopy 03/08/2016  . Hyperlipidemia   . Hypertension   . Scoliosis 03/08/2016  . Sleep apnea   . Stress headaches     Patient Active Problem List   Diagnosis Date Noted  . Hematuria, gross 11/12/2016  . Medication monitoring encounter 09/09/2016  . Chronic hip pain, right 09/09/2016  .  Numbness of right foot 09/09/2016  . Right lower quadrant abdominal pain 09/09/2016  . Scoliosis 03/08/2016  . 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 Family hx of colon cancer requiring screening colonoscopy 03/08/2016  . Intermittent palpitations 10/27/2015  . Situational anxiety 10/27/2015  . Hypertension goal BP (blood pressure) < 140/90 08/18/2015  . Hyperlipidemia LDL goal <100 08/18/2015  . Insomnia, controlled 08/18/2015  . Chronic mid back pain 08/18/2015    Past Surgical History:  Procedure Laterality Date  . ABDOMINAL HYSTERECTOMY    . CESAREAN SECTION     1  . COLONOSCOPY    . EYE SURGERY     [facility] eye surgery    Prior to Admission medications   Medication Sig Start Date End Date Taking? Authorizing Provider  aspirin EC 81 MG tablet Take 1 tablet (81 mg total) by mouth daily. 09/09/16   Arnetha Courser, MD  Cholecalciferol (VITAMIN D3) 2000 units TABS Take 1 capsule by mouth daily.    Historical Provider, MD  cyclobenzaprine (FLEXERIL) 10 MG tablet Take 1 tablet by mouth 2 (two) times daily as needed. Reported on 03/08/2016 08/07/15   Historical Provider, MD  estradiol (ESTRACE VAGINAL) 0.1 MG/GM vaginal cream Place 1 Applicatorful vaginally 2 (two) times a week. 04/05/16   Arnetha Courser, MD  losartan-hydrochlorothiazide (HYZAAR) 50-12.5 MG tablet Take 1 tablet by mouth daily. (this replaces Benicar-HCT) 09/09/16   Arnetha Courser, MD  Melatonin 5 MG CAPS Take 5 mg by mouth  daily.    Historical Provider, MD  Multiple Vitamin (MULTIVITAMIN) tablet Take 1 tablet by mouth daily.    Historical Provider, MD  Multiple Vitamins-Minerals (HAIR/SKIN/NAILS/BIOTIN PO) Take 1 capsule by mouth daily.    Historical Provider, MD  Multiple Vitamins-Minerals (WOMENS HAIR, SKIN & NAILS PO) Take by mouth.    Historical Provider, MD  simvastatin (ZOCOR) 40 MG tablet Take 1 tablet (40 mg total) by mouth at bedtime. 07/29/16   Arnetha Courser, MD  tobramycin-dexamethasone [facility])  ophthalmic solution  08/06/16   Historical Provider, MD  traZODone (DESYREL) 50 MG tablet Take 0.5-1 tablets (25-50 mg total) by mouth at bedtime as needed for sleep. 03/08/16   Arnetha Courser, MD    Allergies Patient has no known allergies.  Family History  Problem Relation Age of Onset  . Arthritis Mother   . COPD Mother   . Depression Mother   . Diabetes Mother   . Hyperlipidemia Mother   . Hypertension Mother   . Cataracts Mother   . Heart disease Mother   . Cancer Father     kidney and prostate cancer, colon cancer  . Hearing loss Father   . Kidney disease Father   . Cataracts Father   . Asthma Sister   . Cancer Paternal Grandmother   . Mental illness Sister   . Breast cancer Other     Social History Social History  Substance Use Topics  . Smoking status: Former Research scientist (life sciences)  . Smokeless tobacco: Never Used     Comment: but only for a short amount of time  . Alcohol use Not on file     Comment: occasional glass of wine    Review of Systems Constitutional: No fever/chills Eyes: No visual changesExcept felt like black was closing and over her eyes when she was about pass out. ENT: No sore throat. Cardiovascular: Denies chest pain. Respiratory: Denies shortness of breath. Gastrointestinal: No abdominal pain. no vomiting.  No diarrhea.  No constipation. Genitourinary: Negative for dysuria. Musculoskeletal: Negative for back pain. Skin: Negative for rash. Neurological: Negative for headaches, focal weakness or numbness.  10-point ROS otherwise negative.  ____________________________________________   PHYSICAL EXAM:  VITAL SIGNS: ED Triage Vitals  Enc Vitals Group     BP 11/21/16 2351 119/80     Pulse Rate 11/21/16 2351 78     Resp 11/21/16 2351 17     Temp 11/21/16 2351 98.3 F (36.8 C)     Temp Source 11/21/16 2351 Oral     SpO2 11/21/16 2351 100 %     Weight 11/21/16 2343 167 lb (75.8 kg)     Height 11/21/16 2343 5' 4"  (1.626 m)     Head Circumference  --      Peak Flow --      Pain Score --      Pain Loc --      Pain Edu? --      Excl. in [place]? --     Constitutional: Alert and oriented. Well appearing and in no acute distress. Eyes: Conjunctivae are normal. PERRL. EOMI. Head: Atraumatic. Nose: No congestion/rhinnorhea. Mouth/Throat: Mucous membranes are moist.  Oropharynx non-erythematous. Neck: No stridor.  No cervical spine tenderness Cardiovascular: Normal rate, regular rhythm. Grossly normal heart sounds.  Good peripheral circulation. Respiratory: Normal respiratory effort.  No retractions. Lungs CTAB. Gastrointestinal: Soft and nontender. No distention.  Musculoskeletal: No lower extremity tenderness nor edema.   Neurologic:  Normal speech and language. No gross focal neurologic deficits are appreciated.  The patient has no pronator drift. The patient has normal cranial nerve exam. Extraocular movements are normal. Visual fields are normal. Patient has 5 out of 5 strength in all extremities. There is no numbness or gross, acute sensory abnormality in the extremities bilaterally. No speech disturbance. No dysarthria. No aphasia. No ataxia. Normal finger nose finger bilat. Patient speaking in full and clear sentences.   Skin:  Skin is warm, dry and intact. No rash noted. Psychiatric: Mood and affect are normal. Speech and behavior are normal.  ____________________________________________   LABS (all labs ordered are listed, but only abnormal results are displayed)  Labs Reviewed  BASIC METABOLIC PANEL - Abnormal; Notable for the following:       Result Value   Glucose, Bld 125 (*)    All other components within normal limits  CBC - Abnormal; Notable for the following:    MCV 75.0 (*)    MCH 25.7 (*)    All other components within normal limits  URINALYSIS, COMPLETE (UACMP) WITH MICROSCOPIC - Abnormal; Notable for the following:    Color, Urine YELLOW (*)    APPearance CLEAR (*)    Bacteria, UA RARE (*)     Squamous Epithelial / LPF 0-5 (*)    All other components within normal limits  GLUCOSE, CAPILLARY  TROPONIN I  CBG MONITORING, ED   ____________________________________________  EKG  ED ECG REPORT I, QUALE, MARK, the attending physician, personally viewed and interpreted this ECG.  Date: 11/22/2016 EKG Time: 2345 Rate: 75 Rhythm: normal sinus rhythm QRS Axis: normal Intervals: normal ST/T Wave abnormalities: normal Conduction Disturbances: none Narrative Interpretation: unremarkable  ____________________________________________  RADIOLOGY   ____________________________________________   PROCEDURES  Procedure(s) performed: None  Procedures  Critical Care performed: No  ____________________________________________   INITIAL IMPRESSION / ASSESSMENT AND PLAN / ED COURSE  Pertinent labs & imaging results that were available during my care of the patient were reviewed by me and considered in my medical decision making (see chart for details).  Patient presents after a syncopal episode. The clinical history, and for lightheadedness and nausea preceding her somewhat concerning for possibility of a side effect due to the trazodone which she had taken 15 minutes prior to the whole episode. However, certainly other considerations are given. She has no cardiac complaints, no pulmonary complaints. Her hemodynamics are stable here, and her symptoms have resolved by the time I have seen her. She's been able to ambulate, and her orthostatics are normal. She did not have any witnessed generalized seizure like activity, though she did reportedly urinate slightly on herself during the episode. She was awake and oriented shortly after without a postictal state.  She has a history of syncopal in the past, she reports this occurred while on airplane once.  Vitals:   11/21/16 2351 11/22/16 0152  BP: 119/80 116/78  Pulse: 78 84  Resp: 17 17  Temp: 98.3 F (36.8 C)    Labs, EKG,  orthostatics and repeat assessment all very reassuring. Patient asymptomatic at this time resting comfortably. Normal neurologic exam  Discussed with the patient and her daughter, they're comfortable with her going home and she'll follow up closely with her primary care doctor. She will discontinue use of trazodone.  Return precautions and treatment recommendations and follow-up discussed with the patient who is agreeable with the plan.        ____________________________________________   FINAL CLINICAL IMPRESSION(S) / ED DIAGNOSES  Final diagnoses:  Syncope and collapse  Adverse effect of drug, initial  encounter      NEW MEDICATIONS STARTED DURING THIS VISIT:  New Prescriptions   No medications on file     Note:  This document was prepared using Dragon voice recognition software and may include unintentional dictation errors.     Delman Kitten, MD 11/22/16 [phone]

## 2016-11-22 NOTE — Progress Notes (Signed)
Trazodone removed from med list; added to adverse reactions

## 2016-11-22 NOTE — Telephone Encounter (Signed)
I reviewed ER record; added med to adverse reactions; called her, left msg, I'm concerned, hope she's okay; I'll in office all next week if needed

## 2016-11-22 NOTE — Discharge Instructions (Signed)
You have been seen today in the Emergency Department (ED)  for syncope (passing out).  Your workup including labs and EKG show reassuring results.  Your symptoms may be due to taking Trazadone, please STOP this medication and contact your doctor today for follow-up.  Please call your regular doctor as soon as possible to schedule the next available clinic appointment to follow up with him/her regarding your visit to the ED and your symptoms.  Return to the Emergency Department (ED)  if you have any further syncopal episodes (pass out again) or develop ANY chest pain, pressure, tightness, trouble breathing, sudden sweating, or other symptoms that concern you.

## 2016-11-25 ENCOUNTER — Ambulatory Visit: Payer: 59 | Admitting: Anesthesiology

## 2016-11-25 ENCOUNTER — Encounter
Admission: RE | Disposition: A | Discharge status: Home / Self Care | Payer: Self-pay | Source: Ambulatory Visit | Attending: Urology

## 2016-11-25 ENCOUNTER — Ambulatory Visit
Admission: RE | Admit: 2016-11-25 | Discharge: 2016-11-25 | Disposition: A | Discharge status: Home / Self Care | Payer: 59 | Source: Ambulatory Visit | Attending: Urology | Admitting: Urology

## 2016-11-25 DIAGNOSIS — I10 Essential (primary) hypertension: Secondary | ICD-10-CM | POA: Insufficient documentation

## 2016-11-25 DIAGNOSIS — Z800 Family history of malignant neoplasm of digestive organs: Secondary | ICD-10-CM | POA: Diagnosis not present

## 2016-11-25 DIAGNOSIS — Z79899 Other long term (current) drug therapy: Secondary | ICD-10-CM | POA: Diagnosis not present

## 2016-11-25 DIAGNOSIS — Z7982 Long term (current) use of aspirin: Secondary | ICD-10-CM | POA: Diagnosis not present

## 2016-11-25 DIAGNOSIS — G8929 Other chronic pain: Secondary | ICD-10-CM | POA: Diagnosis not present

## 2016-11-25 DIAGNOSIS — E785 Hyperlipidemia, unspecified: Secondary | ICD-10-CM | POA: Diagnosis not present

## 2016-11-25 DIAGNOSIS — Z87891 Personal history of nicotine dependence: Secondary | ICD-10-CM | POA: Insufficient documentation

## 2016-11-25 DIAGNOSIS — R310 Gross hematuria: Secondary | ICD-10-CM | POA: Diagnosis not present

## 2016-11-25 DIAGNOSIS — Z87440 Personal history of urinary (tract) infections: Secondary | ICD-10-CM | POA: Insufficient documentation

## 2016-11-25 DIAGNOSIS — R3129 Other microscopic hematuria: Secondary | ICD-10-CM

## 2016-11-25 DIAGNOSIS — Z79890 Hormone replacement therapy (postmenopausal): Secondary | ICD-10-CM | POA: Insufficient documentation

## 2016-11-25 DIAGNOSIS — Z8051 Family history of malignant neoplasm of kidney: Secondary | ICD-10-CM | POA: Insufficient documentation

## 2016-11-25 DIAGNOSIS — G4730 Sleep apnea, unspecified: Secondary | ICD-10-CM | POA: Diagnosis not present

## 2016-11-25 DIAGNOSIS — Z8042 Family history of malignant neoplasm of prostate: Secondary | ICD-10-CM | POA: Diagnosis not present

## 2016-11-25 DIAGNOSIS — M549 Dorsalgia, unspecified: Secondary | ICD-10-CM | POA: Diagnosis not present

## 2016-11-25 HISTORY — PX: CYSTOSCOPY W/ RETROGRADES: SHX1426

## 2016-11-25 HISTORY — DX: Headache: R51

## 2016-11-25 HISTORY — DX: Headache, unspecified: R51.9

## 2016-11-25 SURGERY — CYSTOSCOPY, WITH RETROGRADE PYELOGRAM
Anesthesia: General | Site: Ureter | Laterality: Bilateral | Wound class: Clean Contaminated

## 2016-11-25 MED ORDER — FAMOTIDINE 20 MG PO TABS
20.0000 mg | ORAL_TABLET | Freq: Once | ORAL | Status: AC
  Administered 2016-11-25: 20 mg via ORAL

## 2016-11-25 MED ORDER — FENTANYL CITRATE (PF) 100 MCG/2ML IJ SOLN
INTRAMUSCULAR | Status: DC | PRN
  Administered 2016-11-25: 100 ug via INTRAVENOUS

## 2016-11-25 MED ORDER — CIPROFLOXACIN IN D5W 400 MG/200ML IV SOLN
400.0000 mg | INTRAVENOUS | Status: AC
  Administered 2016-11-25: 400 mg via INTRAVENOUS

## 2016-11-25 MED ORDER — FENTANYL CITRATE (PF) 100 MCG/2ML IJ SOLN
INTRAMUSCULAR | Status: AC
  Filled 2016-11-25: qty 2

## 2016-11-25 MED ORDER — CIPROFLOXACIN IN D5W 400 MG/200ML IV SOLN
INTRAVENOUS | Status: AC
  Administered 2016-11-25: 400 mg via INTRAVENOUS
  Filled 2016-11-25: qty 200

## 2016-11-25 MED ORDER — FAMOTIDINE 20 MG PO TABS
ORAL_TABLET | ORAL | Status: AC
  Administered 2016-11-25: 20 mg via ORAL
  Filled 2016-11-25: qty 1

## 2016-11-25 MED ORDER — PROPOFOL 10 MG/ML IV BOLUS
INTRAVENOUS | Status: DC | PRN
  Administered 2016-11-25: 15 mg via INTRAVENOUS

## 2016-11-25 MED ORDER — FENTANYL CITRATE (PF) 100 MCG/2ML IJ SOLN: 25.0000 ug | INTRAMUSCULAR | Status: DC | PRN

## 2016-11-25 MED ORDER — MIDAZOLAM HCL 2 MG/2ML IJ SOLN
INTRAMUSCULAR | Status: DC | PRN
  Administered 2016-11-25: 2 mg via INTRAVENOUS

## 2016-11-25 MED ORDER — MIDAZOLAM HCL 2 MG/2ML IJ SOLN
INTRAMUSCULAR | Status: AC
  Filled 2016-11-25: qty 2

## 2016-11-25 MED ORDER — IOTHALAMATE MEGLUMINE 43 % IV SOLN
INTRAVENOUS | Status: DC | PRN
  Administered 2016-11-25: 15 mL

## 2016-11-25 MED ORDER — PROPOFOL 10 MG/ML IV BOLUS
INTRAVENOUS | Status: AC
  Filled 2016-11-25: qty 20

## 2016-11-25 MED ORDER — LACTATED RINGERS IV SOLN
INTRAVENOUS | Status: DC
  Administered 2016-11-25: 1000 mL via INTRAVENOUS

## 2016-11-25 MED ORDER — PROPOFOL 500 MG/50ML IV EMUL
INTRAVENOUS | Status: DC | PRN
  Administered 2016-11-25: 100 ug/kg/min via INTRAVENOUS

## 2016-11-25 MED ORDER — LIDOCAINE HCL (PF) 2 % IJ SOLN
INTRAMUSCULAR | Status: AC
  Filled 2016-11-25: qty 2

## 2016-11-25 MED ORDER — ONDANSETRON HCL 4 MG/2ML IJ SOLN: 4.0000 mg | Freq: Once | INTRAMUSCULAR | Status: DC | PRN

## 2016-11-25 SURGICAL SUPPLY — 17 items
BAG DRAIN CYSTO-URO LG1000N (MISCELLANEOUS) ×3 IMPLANT
CATH URETL 5X70 OPEN END (CATHETERS) ×3 IMPLANT
CONRAY 43 FOR UROLOGY 50M (MISCELLANEOUS) ×3 IMPLANT
DRAPE UTILITY 15X26 TOWEL STRL (DRAPES) ×3 IMPLANT
GLOVE BIO SURGEON STRL SZ 6.5 (GLOVE) ×2 IMPLANT
GLOVE BIO SURGEONS STRL SZ 6.5 (GLOVE) ×1
GOWN STRL REUS W/ TWL LRG LVL3 (GOWN DISPOSABLE) ×2 IMPLANT
GOWN STRL REUS W/TWL LRG LVL3 (GOWN DISPOSABLE) ×4
KIT RM TURNOVER CYSTO AR (KITS) ×3 IMPLANT
PACK CYSTO AR (MISCELLANEOUS) ×3 IMPLANT
SENSORWIRE 0.038 NOT ANGLED (WIRE) ×3
SET CYSTO W/LG BORE CLAMP LF (SET/KITS/TRAYS/PACK) ×3 IMPLANT
SOL .9 NS 3000ML IRR  AL (IV SOLUTION) ×2
SOL .9 NS 3000ML IRR UROMATIC (IV SOLUTION) ×1 IMPLANT
SURGILUBE 2OZ TUBE FLIPTOP (MISCELLANEOUS) ×3 IMPLANT
WATER STERILE IRR 1000ML POUR (IV SOLUTION) ×3 IMPLANT
WIRE SENSOR 0.038 NOT ANGLED (WIRE) ×1 IMPLANT

## 2016-11-25 NOTE — Op Note (Signed)
Date of procedure: 11/25/16  Preoperative diagnosis:  1. Gross hematuria   Postoperative diagnosis:  1. Gross hematuria   Procedure: 1. Cystoscopy 2. Bilateral retrograde pyelogram  Surgeon: Hollice Espy, MD  Anesthesia: MAC  Complications: None  Intraoperative findings: Normal bladder and unremarkable bilateral retrograde pyelograms.  EBL: minimal  Specimens: None   Drains: none  Indication: Samantha Stephens is a 56 y.o. patient with episode of gross hematuria. She previously underwent CT scan without delay imaging which was unremarkable. She presents today for cystoscopy, bilateral retrograde to complete her workup..  After reviewing the management options for treatment, she elected to proceed with the above surgical procedure(s). We have discussed the potential benefits and risks of the procedure, side effects of the proposed treatment, the likelihood of the patient achieving the goals of the procedure, and any potential problems that might occur during the procedure or recuperation. Informed consent has been obtained.  Description of procedure:  The patient was taken to the operating room and MAC was induced.  The patient was placed in the dorsal lithotomy position, prepped and draped in the usual sterile fashion, and preoperative antibiotics were administered. A preoperative time-out was performed.   A 21 French scope was advanced per urethra into the bladder. The bladder itself was essentially unremarkable with normal urothelium, no ulceration, tumors, or stones. The contour was regular and smooth. The trigonal anatomy was unremarkable with patent UOs with clear reflux of urine from both. Attention was then turned to the right ureteral orifice which was cannulated using a 5 [place] open-ended ureteral catheter. A gentle retrograde pyelogram was performed on the side which showed a delicate appearing ureter without any hydroureteronephrosis or filling defects within the upper  tract collecting system.  The same exact procedure was performed on the left side which was also remarkable without hydronephrosis or filling defects. The ureter was delicate and decompressed.  At this point time, the bladder was drained and the scope was removed. Patient was cleaned and dried, repositioned supine position, reversed from anesthesia, taken the PACU in stable condition.  Given her negative hematuria workup, she may follow-up as needed. Findings were reviewed today with the patient's husband.  Hollice Espy, M.D.

## 2016-11-25 NOTE — Transfer of Care (Signed)
Immediate Anesthesia Transfer of Care Note  Patient: Samantha Stephens  Procedure(s) Performed: Procedure(s): CYSTOSCOPY WITH RETROGRADE PYELOGRAM (Bilateral)  Patient Location: PACU  Anesthesia Type:General  Level of Consciousness: awake, alert , oriented and patient cooperative  Airway & Oxygen Therapy: Patient Spontanous Breathing and Patient connected to nasal cannula oxygen  Post-op Assessment: Report given to RN and Post -op Vital signs reviewed and stable  Post vital signs: Reviewed and stable  Last Vitals:  Vitals:   11/25/16 0908  BP: (!) 142/96  Pulse: 75  Resp: 17  Temp: 37.1 C    Last Pain:  Vitals:   11/25/16 0908  TempSrc: Oral         Complications: No apparent anesthesia complications

## 2016-11-25 NOTE — Discharge Instructions (Signed)
Cystoscopy Cystoscopy is a procedure that is used to help diagnose and sometimes treat conditions that affect that lower urinary tract. The lower urinary tract includes the bladder and the tube that drains urine from the bladder out of the body (urethra). Cystoscopy is performed with a thin, tube-shaped instrument with a light and camera at the end (cystoscope). The cystoscope may be hard (rigid) or flexible, depending on the goal of the procedure.The cystoscope is inserted through the urethra, into the bladder. Cystoscopy may be recommended if you have:  Urinary tractinfections that keep coming back (recurring).  Blood in the urine (hematuria).  Loss of bladder control (urinary incontinence) or an overactive bladder.  Unusual cells found in a urine sample.  A blockage in the urethra.  Painful urination.  An abnormality in the bladder found during an intravenous pyelogram (IVP) or CT scan. Cystoscopy may also be done to remove a sample of tissue to be examined under a microscope (biopsy). Tell a health care provider about:  Any allergies you have.  All medicines you are taking, including vitamins, herbs, eye drops, creams, and over-the-counter medicines.  Any problems you or family members have had with anesthetic medicines.  Any blood disorders you have.  Any surgeries you have had.  Any medical conditions you have.  Whether you are pregnant or may be pregnant. What are the risks? Generally, this is a safe procedure. However, problems may occur, including:  Infection.  Bleeding.  Allergic reactions to medicines.  Damage to other structures or organs. What happens before the procedure?  Ask your health care provider about:  Changing or stopping your regular medicines. This is especially important if you are taking diabetes medicines or blood thinners.  Taking medicines such as aspirin and ibuprofen. These medicines can thin your blood. Do not take these medicines  before your procedure if your health care provider instructs you not to.  Follow instructions from your health care provider about eating or drinking restrictions.  You may be given antibiotic medicine to help prevent infection.  You may have an exam or testing, such as X-rays of the bladder, urethra, or kidneys.  You may have urine tests to check for signs of infection.  Plan to have someone take you home after the procedure. What happens during the procedure?  To reduce your risk of infection,your health care team will wash or sanitize their hands.  You will be given one or more of the following:  A medicine to help you relax (sedative).  A medicine to numb the area (local anesthetic).  The area around the opening of your urethra will be cleaned.  The cystoscope will be passed through your urethra into your bladder.  Germ-free (sterile)fluid will flow through the cystoscope to fill your bladder. The fluid will stretch your bladder so that your surgeon can clearly examine your bladder walls.  The cystoscope will be removed and your bladder will be emptied. The procedure may vary among health care providers and hospitals. What happens after the procedure?  You may have some soreness or pain in your abdomen and urethra. Medicines will be available to help you.  You may have some blood in your urine.  Do not drive for 24 hours if you received a sedative. This information is not intended to replace advice given to you by your health care provider. Make sure you discuss any questions you have with your health care provider. Document Released: 10/04/2000 Document Revised: 02/15/2016 Document Reviewed: 08/24/2015 Elsevier Interactive Patient Education  2017 [employer]   1) The drugs that you were given will stay in your system until tomorrow so for the next 24 hours you should not:  A) Drive an automobile B) Make any  legal decisions C) Drink any alcoholic beverage   2) You may resume regular meals tomorrow.  Today it is better to start with liquids and gradually work up to solid foods.  You may eat anything you prefer, but it is better to start with liquids, then soup and crackers, and gradually work up to solid foods.   3) Please notify your doctor immediately if you have any unusual bleeding, trouble breathing, redness and pain at the surgery site, drainage, fever, or pain not relieved by medication.    4) Additional Instructions:        Please contact your physician with any problems or Same Day Surgery at [phone], Monday through Friday 6 am to 4 pm, or [place] at [facility] number at [phone].

## 2016-11-25 NOTE — Interval H&P Note (Signed)
History and Physical Interval Note:  11/25/2016 9:55 AM  Samantha Stephens  has presented today for surgery, with the diagnosis of MICROSCOPIC HEMATURIA  The various methods of treatment have been discussed with the patient and family. After consideration of risks, benefits and other options for treatment, the patient has consented to  Procedure(s): CYSTOSCOPY WITH RETROGRADE PYELOGRAM (Bilateral) as a surgical intervention .  The patient's history has been reviewed, patient examined, no change in status, stable for surgery.  I have reviewed the patient's chart and labs.  Questions were answered to the patient's satisfaction.    RRR CTAB  Hollice Espy

## 2016-11-25 NOTE — H&P (View-Only) (Signed)
11/18/2016 9:20 AM   Samantha Stephens 22-Sep-1961 [id number]  Referring provider: Arnetha Courser, MD [address] [place], [place] [address]  Chief Complaint  Patient presents with  . Hematuria    New Patient    HPI: 56 yo F who resents for further evaluation of gross hematuria ~2 weeks ago.  [facility] and started on macrobid.  She had no acciated symptoms.    She denies any associated symptoms including frequency, urgency.  No n/v, fevers, chills.    Her urine cleared up spontaneously a few days ago. She is not on any anticoagulation. She does take a daily baby aspirin.    She has had urinary tract infections in the remote past which were different from this and symptomatic with lower abdominal pressure, dysuria, and urgency.  She does have chronic left flank/ back pain and currently undergoing PT for this.    She has a very remote smoking history.  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    No personal history of kidney cancer.    Father has personal history of colon, kidney cancer, and prostate cancer.  He is still living.     PMH: Past Medical History:  Diagnosis Date  . Arthritis   . Chronic mid back pain 08/18/2015  . 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 Family hx of colon cancer requiring screening colonoscopy 03/08/2016  . Hyperlipidemia   . Hypertension   . Scoliosis 03/08/2016  . Stress headaches     Surgical History: Past Surgical History:  Procedure Laterality Date  . ABDOMINAL HYSTERECTOMY    . CESAREAN SECTION     1    Home Medications:  Allergies as of 11/18/2016   No Known Allergies     Medication List       Accurate as of 11/18/16  9:20 AM. Always use your most recent med list.          aspirin EC 81 MG tablet Take 1 tablet (81 mg total) by mouth daily.   cyclobenzaprine 10 MG tablet Commonly known as:  FLEXERIL Take 1 tablet by mouth 2 (two) times daily as needed.  Reported on 03/08/2016   estradiol 0.1 MG/GM vaginal cream Commonly known as:  ESTRACE VAGINAL Place 1 Applicatorful vaginally 2 (two) times a week.   losartan-hydrochlorothiazide 50-12.5 MG tablet Commonly known as:  HYZAAR Take 1 tablet by mouth daily. (this replaces Benicar-HCT)   multivitamin tablet Take 1 tablet by mouth daily.   simvastatin 40 MG tablet Commonly known as:  ZOCOR Take 1 tablet (40 mg total) by mouth at bedtime.   tobramycin-dexamethasone ophthalmic solution Commonly known as:  TOBRADEX   traZODone 50 MG tablet Commonly known as:  DESYREL Take 0.5-1 tablets (25-50 mg total) by mouth at bedtime as needed for sleep.       Allergies: No Known Allergies  Family History: Family History  Problem Relation Age of Onset  . Arthritis Mother   . COPD Mother   . Depression Mother   . Diabetes Mother   . Hyperlipidemia Mother   . Hypertension Mother   . Cataracts Mother   . Heart disease Mother   . Cancer Father     kidney and prostate cancer, colon cancer  . Hearing loss Father   . Kidney disease Father   . Cataracts Father   . Asthma Sister   . Cancer Paternal Grandmother   . Mental illness Sister   .  Breast cancer Other     Social History:  reports that she has quit smoking. She has never used smokeless tobacco. She reports that she does not use drugs. Her alcohol history is not on file.  ROS: UROLOGY Frequent Urination?: No Hard to postpone urination?: No Burning/pain with urination?: No Get up at night to urinate?: Yes Leakage of urine?: No Urine stream starts and stops?: No Trouble starting stream?: No Do you have to strain to urinate?: No Blood in urine?: Yes Urinary tract infection?: Yes Sexually transmitted disease?: No Injury to kidneys or bladder?: No Painful intercourse?: No Weak stream?: No Currently pregnant?: No Vaginal bleeding?: No Last menstrual period?: hysterectomy  Gastrointestinal Nausea?: No Vomiting?:  No Indigestion/heartburn?: Yes Diarrhea?: No Constipation?: No  Constitutional Fever: No Night sweats?: No Weight loss?: No Fatigue?: No  Skin Skin rash/lesions?: No Itching?: No  Eyes Blurred vision?: No Double vision?: No  Ears/Nose/Throat Sore throat?: No Sinus problems?: No  Hematologic/Lymphatic Swollen glands?: No Easy bruising?: No  Cardiovascular Leg swelling?: No Chest pain?: No  Respiratory Cough?: No Shortness of breath?: No  Endocrine Excessive thirst?: No  Musculoskeletal Back pain?: Yes Joint pain?: No  Neurological Headaches?: No Dizziness?: No  Psychologic Depression?: No Anxiety?: No  Physical Exam: BP (!) 144/88   Pulse 93   Ht 5' 3"  (1.6 m)   Wt 167 lb (75.8 kg)   BMI 29.58 kg/m   Constitutional:  Alert and oriented, No acute distress. HEENT: [place] AT, moist mucus membranes.  Trachea midline, no masses. Cardiovascular: No clubbing, cyanosis, or edema. Respiratory: Normal respiratory effort, no increased work of breathing. GI: Abdomen is soft, nontender, nondistended, no abdominal masses GU: No CVA tenderness.  Skin: No rashes, bruises or suspicious lesions. Neurologic: Grossly intact, no focal deficits, moving all 4 extremities. Psychiatric: Normal mood and affect.  Laboratory Data: Lab Results  Component Value Date   WBC 6.5 03/29/2016   HGB 13.5 10/19/2015   HCT 36.5 03/29/2016   MCV 75 (L) 03/29/2016   PLT 285 03/29/2016    Lab Results  Component Value Date   CREATININE 0.90 09/09/2016    Urinalysis UA reviewed, No evidence of microscopic hematuria.  Pertinent Imaging: CLINICAL DATA:  Right flank pain.  EXAM: CT ABDOMEN AND PELVIS WITH CONTRAST  TECHNIQUE: Multidetector CT imaging of the abdomen and pelvis was performed using the standard protocol following bolus administration of intravenous contrast.  CONTRAST:  148m ISOVUE-300 IOPAMIDOL (ISOVUE-300) INJECTION 61%  COMPARISON:  CT scan of April 06, 2007.  FINDINGS: Lower chest: No acute abnormality.  Hepatobiliary: No focal liver abnormality is seen. No gallstones, gallbladder wall thickening, or biliary dilatation.  Pancreas: Unremarkable. No pancreatic ductal dilatation or surrounding inflammatory changes.  Spleen: Normal in size without focal abnormality.  Adrenals/Urinary Tract: Adrenal glands are unremarkable. Kidneys are normal, without renal calculi, focal lesion, or hydronephrosis. Bladder is unremarkable.  Stomach/Bowel: Stomach is within normal limits. Appendix appears normal. No evidence of bowel wall thickening, distention, or inflammatory changes.  Vascular/Lymphatic: No significant vascular findings are present. No enlarged abdominal or pelvic lymph nodes.  Reproductive: Status posthysterectomy.  Ovaries are not visualized.  Other: No abdominal wall hernia or abnormality. No abdominopelvic ascites.  Musculoskeletal: No acute or significant osseous findings.  IMPRESSION: No significant abnormality seen in the abdomen or pelvis.   Electronically Signed   By: JMarijo Conception M.D.   On: 09/18/2016 16:18  CT scan personally reviewed today.  Assessment & Plan:    1. Gross hematuria  We discussed the differential diagnosis for gross hematuria including nephrolithiasis, renal or upper tract tumors, bladder stones, UTIs, or bladder tumors as well as undetermined etiologies.  Reviewing her CT scan, there is no evidence of obvious stone although no noncontrast study was performed. Additionally, there are no renal masses. Delayed images are incompletely.  Per AUA guidelines, I did recommend complete gross hematuria evaluation including upper tract cytology, possible urine cytology, and cystoscopy.   Options were reviewed including repeating CT scan with urogram, including noncontrast and delayed imaging as well as office cystoscopy. Alternatively, consider cystoscopy in the operating room  with bilateral retrograde pyelogram. She is most interested in the latter. We did also discuss the role of possible intervention if any pathology was identified. Risk and benefits of this were discussed in detail.  Preop urine culture.  - Urinalysis, Complete - CULTURE, URINE COMPREHENSIVE  2. Left flank pain Based on history and examination, do feel that this is musculoskeletal in nature.  Schedule cysto, bilateral retrogrades with possible intervention as needed  Hollice Espy, MD  [facility] [address], [place], [place] [address] [phone]

## 2016-11-25 NOTE — Anesthesia Post-op Follow-up Note (Cosign Needed)
Anesthesia QCDR form completed.        

## 2016-11-25 NOTE — Anesthesia Postprocedure Evaluation (Signed)
Anesthesia Post Note  Patient: Samantha Stephens  Procedure(s) Performed: Procedure(s) (LRB): CYSTOSCOPY WITH RETROGRADE PYELOGRAM (Bilateral)  Patient location during evaluation: PACU Anesthesia Type: General Level of consciousness: awake and alert and oriented Pain management: pain level controlled Vital Signs Assessment: post-procedure vital signs reviewed and stable Respiratory status: spontaneous breathing Cardiovascular status: blood pressure returned to baseline Anesthetic complications: no     Last Vitals:  Vitals:   11/25/16 1130 11/25/16 1207  BP: 108/61 134/88  Pulse: 72 61  Resp: 16 16  Temp: 36.1 C     Last Pain:  Vitals:   11/25/16 1207  TempSrc:   PainSc: 0-No pain                 Kiyara Bouffard

## 2016-11-25 NOTE — Anesthesia Preprocedure Evaluation (Signed)
Anesthesia Evaluation  Patient identified by MRN, date of birth, ID band Patient awake    Reviewed: Allergy & Precautions, NPO status , Patient's Chart, lab work & pertinent test results  Airway Mallampati: II       Dental no notable dental hx. (+) Caps   Pulmonary sleep apnea , former smoker,    Pulmonary exam normal        Cardiovascular hypertension, Normal cardiovascular exam     Neuro/Psych  Headaches, PSYCHIATRIC DISORDERS Anxiety    GI/Hepatic negative GI ROS, Neg liver ROS,   Endo/Other  negative endocrine ROS  Renal/GU   negative genitourinary   Musculoskeletal  (+) Arthritis , Osteoarthritis,    Abdominal Normal abdominal exam  (+)   Peds negative pediatric ROS (+)  Hematology negative hematology ROS (+)   Anesthesia Other Findings   Reproductive/Obstetrics                             Anesthesia Physical Anesthesia Plan  ASA: II  Anesthesia Plan: General   Post-op Pain Management:    Induction: Intravenous  Airway Management Planned: LMA  Additional Equipment:   Intra-op Plan:   Post-operative Plan: Extubation in OR  Informed Consent: I have reviewed the patients History and Physical, chart, labs and discussed the procedure including the risks, benefits and alternatives for the proposed anesthesia with the patient or authorized representative who has indicated his/her understanding and acceptance.   Dental advisory given  Plan Discussed with: CRNA and Surgeon  Anesthesia Plan Comments:         Anesthesia Quick Evaluation

## 2016-12-04 ENCOUNTER — Encounter: Payer: Self-pay | Admitting: Family Medicine

## 2016-12-04 ENCOUNTER — Ambulatory Visit (INDEPENDENT_AMBULATORY_CARE_PROVIDER_SITE_OTHER): Payer: 59 | Admitting: Family Medicine

## 2016-12-04 DIAGNOSIS — J209 Acute bronchitis, unspecified: Secondary | ICD-10-CM | POA: Diagnosis not present

## 2016-12-04 MED ORDER — PREDNISONE 20 MG PO TABS: 40.0000 mg | ORAL_TABLET | Freq: Every day | ORAL | 0 refills | Status: AC

## 2016-12-04 MED ORDER — ALBUTEROL SULFATE HFA 108 (90 BASE) MCG/ACT IN AERS: 2.0000 | INHALATION_SPRAY | RESPIRATORY_TRACT | 1 refills | Status: DC | PRN

## 2016-12-04 MED ORDER — BENZONATATE 100 MG PO CAPS: 100.0000 mg | ORAL_CAPSULE | Freq: Three times a day (TID) | ORAL | 0 refills | Status: AC | PRN

## 2016-12-04 NOTE — Progress Notes (Signed)
BP 118/64   Pulse 100   Temp 98.9 F (37.2 C) (Oral)   Resp 16   Wt 166 lb 2 oz (75.4 kg)   SpO2 97%   BMI 28.52 kg/m    Subjective:    Patient ID: Samantha Stephens, female    DOB: April 28, 1961, 56 y.o.   MRN: [id number]  HPI: Samantha Stephens is a 56 y.o. female  Chief Complaint  Patient presents with  . Cough    Congestion and green mucus; stuffy nose    Coughing, coughing up green mucous, thick mucous Nothing like significant SHOB No fever; no body aches Nose congestion and congestion in sinuses; really mostly chest is the problem Can't get it out of her chest Taking coricidin HBP cepacol lozenges; gargling with salt water Sick now for 4 days Getting worse over last 2 days Little bit of wheezing Feels looser now Hydrating well No travel At the hospital recently for taking trazodone and then passing out; EMTs came, evaluated in ER EKG reviewed  Depression screen [facility] 2/9 12/04/2016 09/23/2016 09/09/2016 03/08/2016 08/18/2015  Decreased Interest 0 0 0 0 0  Down, Depressed, Hopeless 0 0 0 0 0  PHQ - 2 Score 0 0 0 0 0   Relevant past medical, surgical, family and social history reviewed Past Medical History:  Diagnosis Date  . Arthritis   . Chronic mid back pain 08/18/2015  . 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 Family hx of colon cancer requiring screening colonoscopy 03/08/2016  . Headache   . Hyperlipidemia   . Hypertension   . Scoliosis 03/08/2016  . Sleep apnea   . Stress headaches    Past Surgical History:  Procedure Laterality Date  . ABDOMINAL HYSTERECTOMY    . CESAREAN SECTION     1  . COLONOSCOPY    . CYSTOSCOPY W/ RETROGRADES Bilateral 11/25/2016   Procedure: CYSTOSCOPY WITH RETROGRADE PYELOGRAM;  Surgeon: Hollice Espy, MD;  Location: [facility] ORS;  Service: Urology;  Laterality: Bilateral;  . EYE SURGERY     lasix eye surgery   Social History  Substance Use Topics  . Smoking status: Former Research scientist (life sciences)  . Smokeless tobacco: Never Used      Comment: but only for a short amount of time  . Alcohol use 0.0 oz/week     Comment: occasional glass of wine   Interim medical history since last visit reviewed. Allergies and medications reviewed  Review of Systems Per HPI unless specifically indicated above     Objective:    BP 118/64   Pulse 100   Temp 98.9 F (37.2 C) (Oral)   Resp 16   Wt 166 lb 2 oz (75.4 kg)   SpO2 97%   BMI 28.52 kg/m   Wt Readings from Last 3 Encounters:  12/04/16 166 lb 2 oz (75.4 kg)  11/25/16 167 lb (75.8 kg)  11/21/16 167 lb (75.8 kg)    Physical Exam  Constitutional: She appears well-developed and well-nourished. No distress.  HENT:  Right Ear: Hearing, tympanic membrane, external ear and ear canal normal.  Left Ear: Hearing, tympanic membrane, external ear and ear canal normal.  Nose: Rhinorrhea (clear; mucosa mildly inflamed) present.  Mouth/Throat: Mucous membranes are normal. Mucous membranes are not dry. Posterior oropharyngeal erythema (very mildly injected posterior OP) present. No oropharyngeal exudate or posterior oropharyngeal edema.  Cardiovascular: Regular rhythm.   No extrasystoles are present.  Borderline tachycardic  Pulmonary/Chest: Effort normal and breath sounds normal. No accessory muscle usage.  No respiratory distress. She has no decreased breath sounds. She has no wheezes. She has no rhonchi.  Lymphadenopathy:    She has no cervical adenopathy.  Skin: She is not diaphoretic. No pallor.      Assessment & Plan:   Problem List Items Addressed This Visit      Respiratory   Acute bronchitis with bronchospasm    Discussed dx; believe this is likely viral; will treat with prednisone, tessalon perles, and may use SABA if needed; rest, hydration; contagious; do not think CXR warranted          Follow up plan: No Follow-up on file.  An after-visit summary was printed and given to the patient at [place].  Please see the patient instructions which may contain  other information and recommendations beyond what is mentioned above in the assessment and plan.  Meds ordered this encounter  Medications  . predniSONE (DELTASONE) 20 MG tablet    Sig: Take 2 tablets (40 mg total) by mouth daily with breakfast.    Dispense:  10 tablet    Refill:  0  . albuterol (PROVENTIL HFA;VENTOLIN HFA) 108 (90 Base) MCG/ACT inhaler    Sig: Inhale 2 puffs into the lungs every 4 (four) hours as needed for wheezing or shortness of breath.    Dispense:  1 Inhaler    Refill:  1  . benzonatate (TESSALON PERLES) 100 MG capsule    Sig: Take 1 capsule (100 mg total) by mouth every 8 (eight) hours as needed for cough.    Dispense:  30 capsule    Refill:  0    No orders of the defined types were placed in this encounter.

## 2016-12-04 NOTE — Assessment & Plan Note (Signed)
Discussed dx; believe this is likely viral; will treat with prednisone, tessalon perles, and may use SABA if needed; rest, hydration; contagious; do not think CXR warranted

## 2016-12-04 NOTE — Patient Instructions (Addendum)
Start the new medicines Okay to use plain Mucinex for expectorant Try vitamin C (orange juice if not diabetic or vitamin C tablets) and drink green tea to help your immune system during your illness Get plenty of rest and hydration Call if needed   Acute Bronchitis, Adult Acute bronchitis is sudden (acute) swelling of the air tubes (bronchi) in the lungs. Acute bronchitis causes these tubes to fill with mucus, which can make it hard to breathe. It can also cause coughing or wheezing. In adults, acute bronchitis usually goes away within 2 weeks. A cough caused by bronchitis may last up to 3 weeks. Smoking, allergies, and asthma can make the condition worse. Repeated episodes of bronchitis may cause further lung problems, such as chronic obstructive pulmonary disease (COPD). What are the causes? This condition can be caused by germs and by substances that irritate the lungs, including:  Cold and flu viruses. This condition is most often caused by the same virus that causes a cold.  Bacteria.  Exposure to tobacco smoke, dust, fumes, and air pollution. What increases the risk? This condition is more likely to develop in people who:  Have close contact with someone with acute bronchitis.  Are exposed to lung irritants, such as tobacco smoke, dust, fumes, and vapors.  Have a weak immune system.  Have a respiratory condition such as asthma. What are the signs or symptoms? Symptoms of this condition include:  A cough.  Coughing up clear, yellow, or green mucus.  Wheezing.  Chest congestion.  Shortness of breath.  A fever.  Body aches.  Chills.  A sore throat. How is this diagnosed? This condition is usually diagnosed with a physical exam. During the exam, your health care provider may order tests, such as chest X-rays, to rule out other conditions. He or she may also:  Test a sample of your mucus for bacterial infection.  Check the level of oxygen in your blood. This is  done to check for pneumonia.  Do a chest X-ray or lung function testing to rule out pneumonia and other conditions.  Perform blood tests. Your health care provider will also ask about your symptoms and medical history. How is this treated? Most cases of acute bronchitis clear up over time without treatment. Your health care provider may recommend:  Drinking more fluids. Drinking more makes your mucus thinner, which may make it easier to breathe.  Taking a medicine for a fever or cough.  Taking an antibiotic medicine.  Using an inhaler to help improve shortness of breath and to control a cough.  Using a cool mist vaporizer or humidifier to make it easier to breathe. Follow these instructions at home: Medicines  Take over-the-counter and prescription medicines only as told by your health care provider.  If you were prescribed an antibiotic, take it as told by your health care provider. Do not stop taking the antibiotic even if you start to feel better. General instructions  Get plenty of rest.  Drink enough fluids to keep your urine clear or pale yellow.  Avoid smoking and secondhand smoke. Exposure to cigarette smoke or irritating chemicals will make bronchitis worse. If you smoke and you need help quitting, ask your health care provider. Quitting smoking will help your lungs heal faster.  Use an inhaler, cool mist vaporizer, or humidifier as told by your health care provider.  Keep all follow-up visits as told by your health care provider. This is important. How is this prevented? To lower your risk of  getting this condition again:  Wash your hands often with soap and water. If soap and water are not available, use hand sanitizer.  Avoid contact with people who have cold symptoms.  Try not to touch your hands to your mouth, nose, or eyes.  Make sure to get the flu shot every year. Contact a health care provider if:  Your symptoms do not improve in 2 weeks of  treatment. Get help right away if:  You cough up blood.  You have chest pain.  You have severe shortness of breath.  You become dehydrated.  You faint or keep feeling like you are going to faint.  You keep vomiting.  You have a severe headache.  Your fever or chills gets worse. This information is not intended to replace advice given to you by your health care provider. Make sure you discuss any questions you have with your health care provider. Document Released: 11/14/2004 Document Revised: 05/01/2016 Document Reviewed: 03/27/2016 Elsevier Interactive Patient Education  2017 [employer].

## 2017-02-01 ENCOUNTER — Other Ambulatory Visit: Payer: Self-pay | Admitting: Family Medicine

## 2017-02-01 DIAGNOSIS — E785 Hyperlipidemia, unspecified: Secondary | ICD-10-CM

## 2017-02-01 NOTE — Telephone Encounter (Signed)
Last lpids and sgpt reviewed; rx approved

## 2017-03-10 ENCOUNTER — Encounter: Payer: Self-pay | Admitting: Family Medicine

## 2017-03-10 ENCOUNTER — Ambulatory Visit (INDEPENDENT_AMBULATORY_CARE_PROVIDER_SITE_OTHER): Payer: 59 | Admitting: Family Medicine

## 2017-03-10 VITALS — BP 118/76 | HR 92 | Temp 98.4°F | Resp 14 | Ht 64.0 in | Wt 165.2 lb

## 2017-03-10 DIAGNOSIS — M546 Pain in thoracic spine: Secondary | ICD-10-CM

## 2017-03-10 DIAGNOSIS — Z1159 Encounter for screening for other viral diseases: Secondary | ICD-10-CM | POA: Diagnosis not present

## 2017-03-10 DIAGNOSIS — Z1211 Encounter for screening for malignant neoplasm of colon: Secondary | ICD-10-CM

## 2017-03-10 DIAGNOSIS — G8929 Other chronic pain: Secondary | ICD-10-CM

## 2017-03-10 DIAGNOSIS — Z114 Encounter for screening for human immunodeficiency virus [HIV]: Secondary | ICD-10-CM

## 2017-03-10 DIAGNOSIS — Z0000 Encounter for general adult medical examination without abnormal findings: Secondary | ICD-10-CM | POA: Diagnosis not present

## 2017-03-10 DIAGNOSIS — Z800 Family history of malignant neoplasm of digestive organs: Secondary | ICD-10-CM | POA: Diagnosis not present

## 2017-03-10 NOTE — Assessment & Plan Note (Signed)
USPSTF grade A and B recommendations reviewed with patient; age-appropriate recommendations, preventive care, screening tests, etc discussed and encouraged; healthy living encouraged; see AVS for patient education given to patient  

## 2017-03-10 NOTE — Assessment & Plan Note (Signed)
Due in Dec 2018

## 2017-03-10 NOTE — Patient Instructions (Addendum)
Try turmeric as a natural anti-inflammatory (for pain and arthritis). It comes in capsules where you buy aspirin and fish oil, but also as a spice where you buy pepper and garlic powder. If you need something for aches or pains, try to use Tylenol (acetaminophen) instead of non-steroidals (which include Aleve, ibuprofen, Advil, Motrin, and naproxen); non-steroidals can cause long-term kidney damage Okay to take 3,000 mg of tylenol per day  Health Maintenance, Female Adopting a healthy lifestyle and getting preventive care can go a long way to promote health and wellness. Talk with your health care provider about what schedule of regular examinations is right for you. This is a good chance for you to check in with your provider about disease prevention and staying healthy. In between checkups, there are plenty of things you can do on your own. Experts have done a lot of research about which lifestyle changes and preventive measures are most likely to keep you healthy. Ask your health care provider for more information. Weight and diet Eat a healthy diet  Be sure to include plenty of vegetables, fruits, low-fat dairy products, and lean protein.  Do not eat a lot of foods high in solid fats, added sugars, or salt.  Get regular exercise. This is one of the most important things you can do for your health.  Most adults should exercise for at least 150 minutes each week. The exercise should increase your heart rate and make you sweat (moderate-intensity exercise).  Most adults should also do strengthening exercises at least twice a week. This is in addition to the moderate-intensity exercise. Maintain a healthy weight  Body mass index (BMI) is a measurement that can be used to identify possible weight problems. It estimates body fat based on height and weight. Your health care provider can help determine your BMI and help you achieve or maintain a healthy weight.  For females 89 years of age and  older:  A BMI below 18.5 is considered underweight.  A BMI of 18.5 to 24.9 is normal.  A BMI of 25 to 29.9 is considered overweight.  A BMI of 30 and above is considered obese. Watch levels of cholesterol and blood lipids  You should start having your blood tested for lipids and cholesterol at 57 years of age, then have this test every 5 years.  You may need to have your cholesterol levels checked more often if:  Your lipid or cholesterol levels are high.  You are older than 57 years of age.  You are at high risk for heart disease. Cancer screening Lung Cancer  Lung cancer screening is recommended for adults 55-1 years old who are at high risk for lung cancer because of a history of smoking.  A yearly low-dose CT scan of the lungs is recommended for people who:  Currently smoke.  Have quit within the past 15 years.  Have at least a 30-pack-year history of smoking. A pack year is smoking an average of one pack of cigarettes a day for 1 year.  Yearly screening should continue until it has been 15 years since you quit.  Yearly screening should stop if you develop a health problem that would prevent you from having lung cancer treatment. Breast Cancer  Practice breast self-awareness. This means understanding how your breasts normally appear and feel.  It also means doing regular breast self-exams. Let your health care provider know about any changes, no matter how small.  If you are in your 20s or 30s, you  should have a clinical breast exam (CBE) by a health care provider every 1-3 years as part of a regular health exam.  If you are 76 or older, have a CBE every year. Also consider having a breast X-ray (mammogram) every year.  If you have a family history of breast cancer, talk to your health care provider about genetic screening.  If you are at high risk for breast cancer, talk to your health care provider about having an MRI and a mammogram every year.  Breast cancer  gene (BRCA) assessment is recommended for women who have family members with BRCA-related cancers. BRCA-related cancers include:  Breast.  Ovarian.  Tubal.  Peritoneal cancers.  Results of the assessment will determine the need for genetic counseling and BRCA1 and BRCA2 testing. Cervical Cancer  Your health care provider may recommend that you be screened regularly for cancer of the pelvic organs (ovaries, uterus, and vagina). This screening involves a pelvic examination, including checking for microscopic changes to the surface of your cervix (Pap test). You may be encouraged to have this screening done every 3 years, beginning at age 74.  For women ages 69-65, health care providers may recommend pelvic exams and Pap testing every 3 years, or they may recommend the Pap and pelvic exam, combined with testing for human papilloma virus (HPV), every 5 years. Some types of HPV increase your risk of cervical cancer. Testing for HPV may also be done on women of any age with unclear Pap test results.  Other health care providers may not recommend any screening for nonpregnant women who are considered low risk for pelvic cancer and who do not have symptoms. Ask your health care provider if a screening pelvic exam is right for you.  If you have had past treatment for cervical cancer or a condition that could lead to cancer, you need Pap tests and screening for cancer for at least 20 years after your treatment. If Pap tests have been discontinued, your risk factors (such as having a new sexual partner) need to be reassessed to determine if screening should resume. Some women have medical problems that increase the chance of getting cervical cancer. In these cases, your health care provider may recommend more frequent screening and Pap tests. Colorectal Cancer  This type of cancer can be detected and often prevented.  Routine colorectal cancer screening usually begins at 56 years of age and continues  through 57 years of age.  Your health care provider may recommend screening at an earlier age if you have risk factors for colon cancer.  Your health care provider may also recommend using home test kits to check for hidden blood in the stool.  A small camera at the end of a tube can be used to examine your colon directly (sigmoidoscopy or colonoscopy). This is done to check for the earliest forms of colorectal cancer.  Routine screening usually begins at age 27.  Direct examination of the colon should be repeated every 5-10 years through 57 years of age. However, you may need to be screened more often if early forms of precancerous polyps or small growths are found. Skin Cancer  Check your skin from head to toe regularly.  Tell your health care provider about any new moles or changes in moles, especially if there is a change in a mole's shape or color.  Also tell your health care provider if you have a mole that is larger than the size of a pencil eraser.  Always use  sunscreen. Apply sunscreen liberally and repeatedly throughout the day.  Protect yourself by wearing long sleeves, pants, a wide-brimmed hat, and sunglasses whenever you are outside. Heart disease, diabetes, and high blood pressure  High blood pressure causes heart disease and increases the risk of stroke. High blood pressure is more likely to develop in:  People who have blood pressure in the high end of the normal range (130-139/85-89 mm Hg).  People who are overweight or obese.  People who are African American.  If you are 79-38 years of age, have your blood pressure checked every 3-5 years. If you are 70 years of age or older, have your blood pressure checked every year. You should have your blood pressure measured twice-once when you are at a hospital or clinic, and once when you are not at a hospital or clinic. Record the average of the two measurements. To check your blood pressure when you are not at a hospital  or clinic, you can use:  An automated blood pressure machine at a pharmacy.  A home blood pressure monitor.  If you are between 59 years and 61 years old, ask your health care provider if you should take aspirin to prevent strokes.  Have regular diabetes screenings. This involves taking a blood sample to check your fasting blood sugar level.  If you are at a normal weight and have a low risk for diabetes, have this test once every three years after 57 years of age.  If you are overweight and have a high risk for diabetes, consider being tested at a younger age or more often. Preventing infection Hepatitis B  If you have a higher risk for hepatitis B, you should be screened for this virus. You are considered at high risk for hepatitis B if:  You were born in a country where hepatitis B is common. Ask your health care provider which countries are considered high risk.  Your parents were born in a high-risk country, and you have not been immunized against hepatitis B (hepatitis B vaccine).  You have HIV or AIDS.  You use needles to inject street drugs.  You live with someone who has hepatitis B.  You have had sex with someone who has hepatitis B.  You get hemodialysis treatment.  You take certain medicines for conditions, including cancer, organ transplantation, and autoimmune conditions. Hepatitis C  Blood testing is recommended for:  Everyone born from [birth year] through 1966.  Anyone with known risk factors for hepatitis C. Sexually transmitted infections (STIs)  You should be screened for sexually transmitted infections (STIs) including gonorrhea and chlamydia if:  You are sexually active and are younger than 57 years of age.  You are older than 57 years of age and your health care provider tells you that you are at risk for this type of infection.  Your sexual activity has changed since you were last screened and you are at an increased risk for chlamydia or gonorrhea. Ask  your health care provider if you are at risk.  If you do not have HIV, but are at risk, it may be recommended that you take a prescription medicine daily to prevent HIV infection. This is called pre-exposure prophylaxis (PrEP). You are considered at risk if:  You are sexually active and do not regularly use condoms or know the HIV status of your partner(s).  You take drugs by injection.  You are sexually active with a partner who has HIV. Talk with your health care provider about whether  you are at high risk of being infected with HIV. If you choose to begin PrEP, you should first be tested for HIV. You should then be tested every 3 months for as long as you are taking PrEP. Pregnancy  If you are premenopausal and you may become pregnant, ask your health care provider about preconception counseling.  If you may become pregnant, take 400 to 800 micrograms (mcg) of folic acid every day.  If you want to prevent pregnancy, talk to your health care provider about birth control (contraception). Osteoporosis and menopause  Osteoporosis is a disease in which the bones lose minerals and strength with aging. This can result in serious bone fractures. Your risk for osteoporosis can be identified using a bone density scan.  If you are 67 years of age or older, or if you are at risk for osteoporosis and fractures, ask your health care provider if you should be screened.  Ask your health care provider whether you should take a calcium or vitamin D supplement to lower your risk for osteoporosis.  Menopause may have certain physical symptoms and risks.  Hormone replacement therapy may reduce some of these symptoms and risks. Talk to your health care provider about whether hormone replacement therapy is right for you. Follow these instructions at home:  Schedule regular health, dental, and eye exams.  Stay current with your immunizations.  Do not use any tobacco products including cigarettes,  chewing tobacco, or electronic cigarettes.  If you are pregnant, do not drink alcohol.  If you are breastfeeding, limit how much and how often you drink alcohol.  Limit alcohol intake to no more than 1 drink per day for nonpregnant women. One drink equals 12 ounces of beer, 5 ounces of wine, or 1 ounces of hard liquor.  Do not use street drugs.  Do not share needles.  Ask your health care provider for help if you need support or information about quitting drugs.  Tell your health care provider if you often feel depressed.  Tell your health care provider if you have ever been abused or do not feel safe at home. This information is not intended to replace advice given to you by your health care provider. Make sure you discuss any questions you have with your health care provider. Document Released: 04/22/2011 Document Revised: 03/14/2016 Document Reviewed: 07/11/2015 Elsevier Interactive Patient Education  2017 [employer].

## 2017-03-10 NOTE — Progress Notes (Signed)
Patient ID: Samantha Stephens, female   DOB: [birth year], 56 y.o.   MRN: [id number]   Subjective:   Samantha Stephens is a 56 y.o. female here for a complete physical exam  Interim issues since last visit: nothing worrisome  USPSTF grade A and B recommendations Depression:  Depression screen [facility] 2/9 03/10/2017 12/04/2016 09/23/2016 09/09/2016 03/08/2016  Decreased Interest 0 0 0 0 0  Down, Depressed, Hopeless 0 0 0 0 0  PHQ - 2 Score 0 0 0 0 0   Hypertension: BP Readings from Last 3 Encounters:  03/10/17 118/76  12/04/16 118/64  11/25/16 134/88   Obesity: Wt Readings from Last 3 Encounters:  03/10/17 165 lb 3.2 oz (74.9 kg)  12/04/16 166 lb 2 oz (75.4 kg)  11/25/16 167 lb (75.8 kg)   BMI Readings from Last 3 Encounters:  03/10/17 28.36 kg/m  12/04/16 28.52 kg/m  11/25/16 28.67 kg/m    Alcohol: occasionally, less than 7 a week Tobacco use: remote, nothing HIV, hep B, hep C: order STD testing and prevention (chl/gon/syphilis): declined Intimate partner violence:no verbal Breast cancer: no lumps, mammo UTD BRCA gene screening: great aunt with breast cancer Cervical cancer screening: s/p hyst Osteoporosis: n/a; start age 28 Fall prevention/vitamin D:  Lipids:  Lab Results  Component Value Date   CHOL 158 09/09/2016   CHOL 151 03/29/2016   Lab Results  Component Value Date   HDL 48 09/09/2016   HDL 51 03/29/2016   Lab Results  Component Value Date   LDLCALC 66 09/09/2016   LDLCALC 72 03/29/2016   Lab Results  Component Value Date   TRIG 221 (H) 09/09/2016   TRIG 142 03/29/2016   Lab Results  Component Value Date   CHOLHDL 3.3 09/09/2016   No results found for: LDLDIRECT Glucose:  Glucose  Date Value Ref Range Status  09/09/2016 98 65 - 99 mg/dL Final    Comment:    Specimen received in contact with cells. No visible hemolysis present. However GLUC may be decreased and K increased. Clinical correlation indicated.   03/29/2016 106 (H) 65 - 99 mg/dL  Final  10/20/2012 118 (H) 65 - 99 mg/dL Final   Glucose, Bld  Date Value Ref Range Status  11/22/2016 125 (H) 65 - 99 mg/dL Final  10/19/2015 75 65 - 99 mg/dL Final   Glucose-Capillary  Date Value Ref Range Status  11/21/2016 77 65 - 99 mg/dL Final   Colorectal cancer: [facility] says Dec 2018 Lung cancer:  n/a AAA: n/a Aspirin: taking 81 mg aspirin  Diet: pretty good diet, eats halfway good Exercise: not sure about meeting guidelines; used to walk faithfully every day; can work on that Skin cancer: nothing worrisome  Back pain; saw specialist, did PT, had xrays; but getting worse; hurts all the time; takes tylenol Last urinalysis 11/22/16; no blood on dip  Past Medical History:  Diagnosis Date  . Arthritis   . Chronic mid back pain 08/18/2015  . 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 Family hx of colon cancer requiring screening colonoscopy 03/08/2016  . Headache   . Hyperlipidemia   . Hypertension   . Scoliosis 03/08/2016  . Sleep apnea   . Stress headaches    Past Surgical History:  Procedure Laterality Date  . ABDOMINAL HYSTERECTOMY    . CESAREAN SECTION     1  . COLONOSCOPY    . CYSTOSCOPY W/ RETROGRADES Bilateral 11/25/2016   Procedure: CYSTOSCOPY WITH RETROGRADE PYELOGRAM;  Surgeon: Hollice Espy, MD;  Location: [facility] ORS;  Service: Urology;  Laterality: Bilateral;  . EYE SURGERY     lasix eye surgery   Family History  Problem Relation Age of Onset  . Arthritis Mother   . COPD Mother   . Depression Mother   . Diabetes Mother   . Hyperlipidemia Mother   . Hypertension Mother   . Cataracts Mother   . Heart disease Mother   . Cancer Father        kidney and prostate cancer, colon cancer  . Hearing loss Father   . Kidney disease Father   . Cataracts Father   . Asthma Sister   . Cancer Paternal Grandmother   . Mental illness Sister   . Breast cancer Other    Social History  Substance Use Topics  . Smoking status: Former Research scientist (life sciences)  . Smokeless  tobacco: Never Used     Comment: but only for a short amount of time  . Alcohol use 0.0 oz/week     Comment: occasional glass of wine   Review of Systems  Constitutional: Negative for unexpected weight change.  HENT: Negative for hearing loss.   Eyes: Negative for visual disturbance (has contacts).  Cardiovascular: Negative for chest pain.  Gastrointestinal: Negative for blood in stool.  Endocrine: Negative for polydipsia and polyuria.  Genitourinary: Negative for hematuria.  Musculoskeletal: Positive for back pain.  Allergic/Immunologic: Negative for food allergies.  Neurological: Negative for tremors.  Hematological: Does not bruise/bleed easily.    Objective:   Vitals:   03/10/17 0814  BP: 118/76  Pulse: 92  Resp: 14  Temp: 98.4 F (36.9 C)  TempSrc: Oral  SpO2: 99%  Weight: 165 lb 3.2 oz (74.9 kg)  Height: 5' 4"  (1.626 m)   Body mass index is 28.36 kg/m. Wt Readings from Last 3 Encounters:  03/10/17 165 lb 3.2 oz (74.9 kg)  12/04/16 166 lb 2 oz (75.4 kg)  11/25/16 167 lb (75.8 kg)   Physical Exam  Constitutional: She appears well-developed and well-nourished.  HENT:  Head: Normocephalic and atraumatic.  Right Ear: Hearing, tympanic membrane, external ear and ear canal normal.  Left Ear: Hearing, tympanic membrane, external ear and ear canal normal.  Eyes: Conjunctivae and EOM are normal. Right eye exhibits no hordeolum. Left eye exhibits no hordeolum. No scleral icterus.  Neck: Carotid bruit is not present. No thyromegaly present.  Cardiovascular: Normal rate, regular rhythm, S1 normal, S2 normal and normal heart sounds.   No extrasystoles are present.  Pulmonary/Chest: Effort normal and breath sounds normal. No respiratory distress. Right breast exhibits no inverted nipple, no mass, no nipple discharge, no skin change and no tenderness. Left breast exhibits no inverted nipple, no mass, no nipple discharge, no skin change and no tenderness. Breasts are  symmetrical.  Abdominal: Soft. Normal appearance and bowel sounds are normal. She exhibits no distension, no abdominal bruit, no pulsatile midline mass and no mass. There is no hepatosplenomegaly. There is no tenderness. No hernia.  Musculoskeletal: Normal range of motion. She exhibits no edema.  Lymphadenopathy:       Head (right side): No submandibular adenopathy present.       Head (left side): No submandibular adenopathy present.    She has no cervical adenopathy.    She has no axillary adenopathy.  Neurological: She is alert. She displays no tremor. No cranial nerve deficit. She exhibits normal muscle tone. Gait normal.  Reflex Scores:      Patellar reflexes are 2+ on the right side and  2+ on the left side. Skin: Skin is warm and dry. No bruising and no ecchymosis noted. No cyanosis. No pallor.  Psychiatric: Her speech is normal and behavior is normal. Thought content normal. Her mood appears not anxious. She does not exhibit a depressed mood.    Assessment/Plan:   Problem List Items Addressed This Visit      Other   Preventative health care - Primary    USPSTF grade A and B recommendations reviewed with patient; age-appropriate recommendations, preventive care, screening tests, etc discussed and encouraged; healthy living encouraged; see AVS for patient education given to patient       Relevant Orders   Comprehensive metabolic panel   CBC with Differential/Platelet   Lipid panel   TSH   Family hx of colon cancer requiring screening colonoscopy    Due in Dec 2018      Chronic mid back pain    Was sent to specialist; had xrays, went through PT already; tylenol 500 mg six per day okay; turmeric       Other Visit Diagnoses    Encounter for screening for HIV       Relevant Orders   HIV antibody (with reflex)   Encounter for hepatitis C screening test for low risk patient       Relevant Orders   Hepatitis C Antibody   Screen for colon cancer       Relevant Orders    Ambulatory referral to Gastroenterology       No orders of the defined types were placed in this encounter.  Orders Placed This Encounter  Procedures  . Comprehensive metabolic panel    Order Specific Question:   Has the patient fasted?    Answer:   Yes  . CBC with Differential/Platelet  . Lipid panel    Order Specific Question:   Has the patient fasted?    Answer:   Yes  . TSH  . Hepatitis C Antibody  . HIV antibody (with reflex)  . Ambulatory referral to Gastroenterology    Referral Priority:   Routine    Referral Type:   Consultation    Referral Reason:   Specialty Services Required    Number of Visits Requested:   1    Follow up plan: Return in about 1 year (around 03/10/2018) for complete physical.  An After Visit Summary was printed and given to the patient.

## 2017-03-10 NOTE — Assessment & Plan Note (Signed)
Was sent to specialist; had xrays, went through PT already; tylenol 500 mg six per day okay; turmeric

## 2017-03-11 LAB — CBC WITH DIFFERENTIAL/PLATELET
BASOS ABS: 0.1 10*3/uL (ref 0.0–0.2)
Basos: 1 %
EOS (ABSOLUTE): 0.4 10*3/uL (ref 0.0–0.4)
Eos: 7 %
Hematocrit: 39.3 % (ref 34.0–46.6)
Hemoglobin: 13.2 g/dL (ref 11.1–15.9)
IMMATURE GRANS (ABS): 0 10*3/uL (ref 0.0–0.1)
Immature Granulocytes: 0 %
LYMPHS: 47 %
Lymphocytes Absolute: 3 10*3/uL (ref 0.7–3.1)
MCH: 25.3 pg — ABNORMAL LOW (ref 26.6–33.0)
MCHC: 33.6 g/dL (ref 31.5–35.7)
MCV: 75 fL — ABNORMAL LOW (ref 79–97)
MONOS ABS: 0.4 10*3/uL (ref 0.1–0.9)
Monocytes: 6 %
NEUTROS PCT: 39 %
Neutrophils Absolute: 2.5 10*3/uL (ref 1.4–7.0)
PLATELETS: 286 10*3/uL (ref 150–379)
RBC: 5.22 x10E6/uL (ref 3.77–5.28)
RDW: 15.3 % (ref 12.3–15.4)
WBC: 6.4 10*3/uL (ref 3.4–10.8)

## 2017-03-11 LAB — COMPREHENSIVE METABOLIC PANEL
A/G RATIO: 1.6 (ref 1.2–2.2)
ALK PHOS: 79 IU/L (ref 39–117)
ALT: 27 IU/L (ref 0–32)
AST: 27 IU/L (ref 0–40)
Albumin: 4.7 g/dL (ref 3.5–5.5)
BILIRUBIN TOTAL: 0.3 mg/dL (ref 0.0–1.2)
BUN/Creatinine Ratio: 13 (ref 9–23)
BUN: 13 mg/dL (ref 6–24)
CALCIUM: 10 mg/dL (ref 8.7–10.2)
CHLORIDE: 101 mmol/L (ref 96–106)
CO2: 23 mmol/L (ref 18–29)
Creatinine, Ser: 0.97 mg/dL (ref 0.57–1.00)
GFR calc Af Amer: 76 mL/min/{1.73_m2} (ref >59)
GFR calc non Af Amer: 65 mL/min/{1.73_m2} (ref >59)
GLOBULIN, TOTAL: 3 g/dL (ref 1.5–4.5)
Glucose: 115 mg/dL — ABNORMAL HIGH (ref 65–99)
POTASSIUM: 3.9 mmol/L (ref 3.5–5.2)
SODIUM: 142 mmol/L (ref 134–144)
Total Protein: 7.7 g/dL (ref 6.0–8.5)

## 2017-03-11 LAB — TSH: TSH: 3.23 u[IU]/mL (ref 0.450–4.500)

## 2017-03-11 LAB — LIPID PANEL
CHOLESTEROL TOTAL: 166 mg/dL (ref 100–199)
Chol/HDL Ratio: 3.1 ratio (ref 0.0–4.4)
HDL: 54 mg/dL (ref >39)
LDL Calculated: 80 mg/dL (ref 0–99)
Triglycerides: 160 mg/dL — ABNORMAL HIGH (ref 0–149)
VLDL CHOLESTEROL CAL: 32 mg/dL (ref 5–40)

## 2017-03-11 LAB — HEPATITIS C ANTIBODY: Hep C Virus Ab: 2.1 s/co ratio — ABNORMAL HIGH (ref 0.0–0.9)

## 2017-03-11 LAB — HIV ANTIBODY (ROUTINE TESTING W REFLEX): HIV Screen 4th Generation wRfx: NONREACTIVE

## 2017-03-12 ENCOUNTER — Other Ambulatory Visit: Payer: Self-pay | Admitting: Family Medicine

## 2017-03-12 ENCOUNTER — Other Ambulatory Visit: Payer: Self-pay

## 2017-03-12 ENCOUNTER — Telehealth: Payer: Self-pay | Admitting: Family Medicine

## 2017-03-12 DIAGNOSIS — R768 Other specified abnormal immunological findings in serum: Secondary | ICD-10-CM

## 2017-03-12 NOTE — Progress Notes (Signed)
Adding on hep C tests

## 2017-03-12 NOTE — Telephone Encounter (Signed)
Hep C test positive (screening), so need more testing; add ons ordered ------------------------------------ Left message that I'm just calling about her labs and want to discuss

## 2017-03-13 NOTE — Telephone Encounter (Signed)
I called her again; left message that I was calling about lab results

## 2017-03-13 NOTE — Telephone Encounter (Signed)
Patient came in and we talked about all of her labs 22% false positive rate, so don't lose sleep over this Will get additional tests Also reviewed her other labs Tender to palpation over RIGHT paraspinous muscles lumbar region

## 2017-03-16 LAB — HCV RNA QUANT: Hepatitis C Quantitation: NOT DETECTED IU/mL

## 2017-03-16 LAB — HEPATITIS C GENOTYPE

## 2017-05-13 ENCOUNTER — Encounter: Payer: Self-pay | Admitting: Family Medicine

## 2017-05-14 MED ORDER — SCOPOLAMINE 1 MG/3DAYS TD PT72: 1.0000 | MEDICATED_PATCH | TRANSDERMAL | 0 refills | Status: DC

## 2017-08-04 ENCOUNTER — Other Ambulatory Visit: Payer: Self-pay | Admitting: Family Medicine

## 2017-08-19 ENCOUNTER — Emergency Department: Payer: 59

## 2017-08-19 ENCOUNTER — Emergency Department
Admission: EM | Admit: 2017-08-19 | Discharge: 2017-08-19 | Disposition: A | Discharge status: Home / Self Care | Payer: 59 | Attending: Emergency Medicine | Admitting: Emergency Medicine

## 2017-08-19 ENCOUNTER — Telehealth: Payer: Self-pay | Admitting: Family Medicine

## 2017-08-19 ENCOUNTER — Encounter: Payer: Self-pay | Admitting: *Deleted

## 2017-08-19 DIAGNOSIS — Z87891 Personal history of nicotine dependence: Secondary | ICD-10-CM | POA: Insufficient documentation

## 2017-08-19 DIAGNOSIS — Z7982 Long term (current) use of aspirin: Secondary | ICD-10-CM | POA: Insufficient documentation

## 2017-08-19 DIAGNOSIS — Z79899 Other long term (current) drug therapy: Secondary | ICD-10-CM | POA: Diagnosis not present

## 2017-08-19 DIAGNOSIS — E876 Hypokalemia: Secondary | ICD-10-CM | POA: Diagnosis not present

## 2017-08-19 DIAGNOSIS — I10 Essential (primary) hypertension: Secondary | ICD-10-CM | POA: Diagnosis not present

## 2017-08-19 DIAGNOSIS — R002 Palpitations: Secondary | ICD-10-CM | POA: Insufficient documentation

## 2017-08-19 LAB — BASIC METABOLIC PANEL
ANION GAP: 9 (ref 5–15)
BUN: 13 mg/dL (ref 6–20)
CALCIUM: 9.5 mg/dL (ref 8.9–10.3)
CO2: 28 mmol/L (ref 22–32)
Chloride: 101 mmol/L (ref 101–111)
Creatinine, Ser: 1.05 mg/dL — ABNORMAL HIGH (ref 0.44–1.00)
GFR calc Af Amer: >60 mL/min (ref >60)
GFR, EST NON AFRICAN AMERICAN: 58 mL/min — AB (ref >60)
GLUCOSE: 151 mg/dL — AB (ref 65–99)
Potassium: 3.3 mmol/L — ABNORMAL LOW (ref 3.5–5.1)
SODIUM: 138 mmol/L (ref 135–145)

## 2017-08-19 LAB — CBC
HCT: 39.6 % (ref 35.0–47.0)
Hemoglobin: 13.7 g/dL (ref 12.0–16.0)
MCH: 25.8 pg — ABNORMAL LOW (ref 26.0–34.0)
MCHC: 34.5 g/dL (ref 32.0–36.0)
MCV: 74.9 fL — ABNORMAL LOW (ref 80.0–100.0)
Platelets: 278 10*3/uL (ref 150–440)
RBC: 5.29 MIL/uL — ABNORMAL HIGH (ref 3.80–5.20)
RDW: 14.5 % (ref 11.5–14.5)
WBC: 8.5 10*3/uL (ref 3.6–11.0)

## 2017-08-19 LAB — TROPONIN I

## 2017-08-19 MED ORDER — POTASSIUM CHLORIDE ER 10 MEQ PO TBCR: 10.0000 meq | EXTENDED_RELEASE_TABLET | Freq: Two times a day (BID) | ORAL | 0 refills | Status: DC

## 2017-08-19 MED ORDER — POTASSIUM CHLORIDE CRYS ER 20 MEQ PO TBCR
20.0000 meq | EXTENDED_RELEASE_TABLET | Freq: Once | ORAL | Status: AC
  Administered 2017-08-19: 20 meq via ORAL
  Filled 2017-08-19: qty 1

## 2017-08-19 NOTE — Discharge Instructions (Signed)
Please seek medical attention for any high fevers, chest pain, shortness of breath, change in behavior, persistent vomiting, bloody stool or any other new or concerning symptoms.  

## 2017-08-19 NOTE — ED Notes (Signed)
Pt alert and oriented X4, active, cooperative, pt in NAD. RR even and unlabored, color WNL.  Pt informed to return if any life threatening symptoms occur.  Discharge and followup instructions reviewed.  

## 2017-08-19 NOTE — ED Provider Notes (Signed)
[facility] Emergency Department Provider Note  ____________________________________________   I have reviewed the triage vital signs and the nursing notes.   HISTORY  Chief Complaint Palpitations   History limited by: Not Limited   HPI Samantha Stephens is a 56 y.o. female who presents to the emergency department today because of palpitations   LOCATION:left chest DURATION:started yesterday TIMING: had one discreet episode yesterday and one today QUALITY: feels like it is racing and skipping beats CONTEXT: patient states that she has a history of panic attacks and palpitations. Has worn a holter monitor in the past. Has not had issues for a few years. Had a decaf coffee today. MODIFYING FACTORS: none ASSOCIATED SYMPTOMS: denies chest pain. Denies shortness of breath.  Per medical record review patient has a history of intermittent palpitations.  Past Medical History:  Diagnosis Date  . Arthritis   . Chronic mid back pain 08/18/2015  . 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 Family hx of colon cancer requiring screening colonoscopy 03/08/2016  . Headache   . Hyperlipidemia   . Hypertension   . Scoliosis 03/08/2016  . Sleep apnea   . Stress headaches     Patient Active Problem List   Diagnosis Date Noted  . Hepatitis C antibody test positive 03/12/2017  . Preventative health care 03/10/2017  . Acute bronchitis with bronchospasm 12/04/2016  . Hematuria, gross 11/12/2016  . Medication monitoring encounter 09/09/2016  . Chronic hip pain, right 09/09/2016  . Numbness of right foot 09/09/2016  . Right lower quadrant abdominal pain 09/09/2016  . Scoliosis 03/08/2016  . 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 Family hx of colon cancer requiring screening colonoscopy 03/08/2016  . Intermittent palpitations 10/27/2015  . Situational anxiety 10/27/2015  . Hypertension goal BP (blood pressure) < 140/90 08/18/2015  .  Hyperlipidemia LDL goal <100 08/18/2015  . Insomnia, controlled 08/18/2015  . Chronic mid back pain 08/18/2015    Past Surgical History:  Procedure Laterality Date  . ABDOMINAL HYSTERECTOMY    . CESAREAN SECTION     1  . COLONOSCOPY    . CYSTOSCOPY W/ RETROGRADES Bilateral 11/25/2016   Procedure: CYSTOSCOPY WITH RETROGRADE PYELOGRAM;  Surgeon: Hollice Espy, MD;  Location: [facility] ORS;  Service: Urology;  Laterality: Bilateral;  . EYE SURGERY     lasix eye surgery    Prior to Admission medications   Medication Sig Start Date End Date Taking? Authorizing Provider  albuterol (PROVENTIL HFA;VENTOLIN HFA) 108 (90 Base) MCG/ACT inhaler Inhale 2 puffs into the lungs every 4 (four) hours as needed for wheezing or shortness of breath. 12/04/16   Arnetha Courser, MD  aspirin EC 81 MG tablet Take 1 tablet (81 mg total) by mouth daily. 09/09/16   Arnetha Courser, MD  Cholecalciferol (VITAMIN D3) 2000 units TABS Take 1 capsule by mouth daily.    [provider]  estradiol (ESTRACE VAGINAL) 0.1 MG/GM vaginal cream Place 1 Applicatorful vaginally 2 (two) times a week. 04/05/16   Arnetha Courser, MD  losartan-hydrochlorothiazide (HYZAAR) 50-12.5 MG tablet Take 1 tablet by mouth daily. (this replaces Benicar-HCT) 09/09/16   Lada, Satira Anis, MD  Melatonin 5 MG CAPS Take 5 mg by mouth daily.    [provider]  Multiple Vitamin (MULTIVITAMIN) tablet Take 1 tablet by mouth daily.    [provider]  scopolamine (TRANSDERM-SCOP, 1.5 MG,) 1 MG/3DAYS Place 1 patch (1.5 mg total) onto the skin every 3 (three) days. 05/14/17   Lada,  Satira Anis, MD  simvastatin (ZOCOR) 40 MG tablet TAKE 1 TABLET BY MOUTH AT  BEDTIME 08/05/17   Lada, Satira Anis, MD  tobramycin-dexamethasone [facility]) ophthalmic solution  08/06/16   [provider]    Allergies Trazodone and nefazodone  Family History  Problem Relation Age of Onset  . Arthritis Mother   . COPD Mother   . Depression Mother   .  Diabetes Mother   . Hyperlipidemia Mother   . Hypertension Mother   . Cataracts Mother   . Heart disease Mother   . Cancer Father        kidney and prostate cancer, colon cancer  . Hearing loss Father   . Kidney disease Father   . Cataracts Father   . Asthma Sister   . Cancer Paternal Grandmother   . Mental illness Sister   . Breast cancer Other     Social History Social History  Substance Use Topics  . Smoking status: Former Research scientist (life sciences)  . Smokeless tobacco: Never Used     Comment: but only for a short amount of time  . Alcohol use 0.0 oz/week     Comment: occasional glass of wine    Review of Systems Constitutional: No fever/chills Eyes: No visual changes. ENT: No sore throat. Cardiovascular: Denies chest pain. Positive for palpitations. Respiratory: Denies shortness of breath. Gastrointestinal: No abdominal pain.  No nausea, no vomiting.  No diarrhea.   Genitourinary: Negative for dysuria. Musculoskeletal: Negative for back pain. Skin: Negative for rash. Neurological: Negative for headaches, focal weakness or numbness.  ____________________________________________   PHYSICAL EXAM:  VITAL SIGNS: ED Triage Vitals  Enc Vitals Group     BP 08/19/17 1357 (!) 158/102     Pulse Rate 08/19/17 1357 100     Resp 08/19/17 1357 16     Temp 08/19/17 1357 98 F (36.7 C)     Temp Source 08/19/17 1357 Oral     SpO2 08/19/17 1357 100 %     Weight 08/19/17 1355 168 lb (76.2 kg)     Height 08/19/17 1355 5' 3"  (1.6 m)     Head Circumference --      Peak Flow --      Pain Score 08/19/17 1355 3    Constitutional: Alert and oriented. Well appearing and in no distress. Eyes: Conjunctivae are normal.  ENT   Head: Normocephalic and atraumatic.   Nose: No congestion/rhinnorhea.   Mouth/Throat: Mucous membranes are moist.   Neck: No stridor. Hematological/Lymphatic/Immunilogical: No cervical lymphadenopathy. Cardiovascular: Normal rate, regular rhythm.  No murmurs,  rubs, or gallops.  Respiratory: Normal respiratory effort without tachypnea nor retractions. Breath sounds are clear and equal bilaterally. No wheezes/rales/rhonchi. Gastrointestinal: Soft and non tender. No rebound. No guarding.  Genitourinary: Deferred Musculoskeletal: Normal range of motion in all extremities. No lower extremity edema. Neurologic:  Normal speech and language. No gross focal neurologic deficits are appreciated.  Skin:  Skin is warm, dry and intact. No rash noted. Psychiatric: Mood and affect are normal. Speech and behavior are normal. Patient exhibits appropriate insight and judgment.  ____________________________________________    LABS (pertinent positives/negatives)  BMP k 3.3, glu 151, cr 1.05 Cbc wbc 8.5, hgb 13.7 Trop <0.03  ____________________________________________   EKG  I, Nance Pear, attending physician, personally viewed and interpreted this EKG  EKG Time: 1353 Rate: 102 Rhythm: sinus tachycardia with pac Axis: normal Intervals: qtc 404 QRS: q waves V1 ST changes: no st elevation Impression: abnormal ekg  ____________________________________________    RADIOLOGY  CXR No acute disease   ____________________________________________   PROCEDURES  Procedures  ____________________________________________   INITIAL IMPRESSION / ASSESSMENT AND PLAN / ED COURSE  Pertinent labs & imaging results that were available during my care of the patient were reviewed by me and considered in my medical decision making (see chart for details).  Patient here with palpitations. ddx would include tachycardia due to sepsis, anemia, pvcs, pacs, afib, electrolyte abnormality amongst other etiologies. Work up shows a slightly low potassium, however not low enough that I think it would explain the patient's symptoms. At the time of my exam no further symptoms. At this point doubt ACS. Discussed results with patient and encouraged PCP follow up.  Discussed return precautions.  ____________________________________________   FINAL CLINICAL IMPRESSION(S) / ED DIAGNOSES  Final diagnoses:  Palpitations     Note: This dictation was prepared with Dragon dictation. Any transcriptional errors that result from this process are unintentional     Nance Pear, MD 08/19/17 1534

## 2017-08-19 NOTE — ED Triage Notes (Signed)
States today after lunch she developed "palpatations" and feeling SOB, states hx of same and it was a panic attack, awake and alert in no acute distress

## 2017-08-19 NOTE — Telephone Encounter (Signed)
I left a detailed message on identified voicemail Read ER note, reviewed labs; will send in K+ if they did not already give her some to take; may help with palpitations Happy to see her

## 2017-08-19 NOTE — ED Notes (Signed)
Pt felt like her heart was racing while at work. States it was almost back to normal at time of arriving to ER. Pt alert and oriented X4, active, cooperative, pt in NAD. RR even and unlabored, color WNL.  Denies CP at any point.

## 2017-08-21 ENCOUNTER — Encounter: Payer: Self-pay | Admitting: Family Medicine

## 2017-08-21 ENCOUNTER — Other Ambulatory Visit: Payer: Self-pay | Admitting: Family Medicine

## 2017-08-29 ENCOUNTER — Ambulatory Visit: Payer: 59 | Admitting: Family Medicine

## 2017-08-29 ENCOUNTER — Encounter: Payer: Self-pay | Admitting: Family Medicine

## 2017-08-29 VITALS — BP 112/68 | HR 91 | Temp 98.3°F | Resp 14 | Wt 170.5 lb

## 2017-08-29 DIAGNOSIS — E876 Hypokalemia: Secondary | ICD-10-CM | POA: Diagnosis not present

## 2017-08-29 DIAGNOSIS — Z23 Encounter for immunization: Secondary | ICD-10-CM | POA: Diagnosis not present

## 2017-08-29 DIAGNOSIS — Z1231 Encounter for screening mammogram for malignant neoplasm of breast: Secondary | ICD-10-CM

## 2017-08-29 DIAGNOSIS — I10 Essential (primary) hypertension: Secondary | ICD-10-CM | POA: Diagnosis not present

## 2017-08-29 DIAGNOSIS — Z1239 Encounter for other screening for malignant neoplasm of breast: Secondary | ICD-10-CM

## 2017-08-29 MED ORDER — LOSARTAN POTASSIUM 50 MG PO TABS: 50.0000 mg | ORAL_TABLET | Freq: Every day | ORAL | 3 refills | Status: DC

## 2017-08-29 NOTE — Patient Instructions (Addendum)
Let's stop the losartan-HCT Start plain losartan Try plain tylenol for arthritis Return in 10-14 days, and we'll recheck your potassium and also check labs for arthritis Try to use PLAIN allergy medicine without the decongestant Avoid: phenylephrine, phenylpropanolamine, and pseudoephredine   DASH Eating Plan DASH stands for "Dietary Approaches to Stop Hypertension." The DASH eating plan is a healthy eating plan that has been shown to reduce high blood pressure (hypertension). It may also reduce your risk for type 2 diabetes, heart disease, and stroke. The DASH eating plan may also help with weight loss. What are tips for following this plan? General guidelines  Avoid eating more than 2,300 mg (milligrams) of salt (sodium) a day. If you have hypertension, you may need to reduce your sodium intake to 1,500 mg a day.  Limit alcohol intake to no more than 1 drink a day for nonpregnant women and 2 drinks a day for men. One drink equals 12 oz of beer, 5 oz of wine, or 1 oz of hard liquor.  Work with your health care provider to maintain a healthy body weight or to lose weight. Ask what an ideal weight is for you.  Get at least 30 minutes of exercise that causes your heart to beat faster (aerobic exercise) most days of the week. Activities may include walking, swimming, or biking.  Work with your health care provider or diet and nutrition specialist (dietitian) to adjust your eating plan to your individual calorie needs. Reading food labels  Check food labels for the amount of sodium per serving. Choose foods with less than 5 percent of the Daily Value of sodium. Generally, foods with less than 300 mg of sodium per serving fit into this eating plan.  To find whole grains, look for the word "whole" as the first word in the ingredient list. Shopping  Buy products labeled as "low-sodium" or "no salt added."  Buy fresh foods. Avoid canned foods and premade or frozen meals. Cooking  Avoid  adding salt when cooking. Use salt-free seasonings or herbs instead of table salt or sea salt. Check with your health care provider or pharmacist before using salt substitutes.  Do not fry foods. Cook foods using healthy methods such as baking, boiling, grilling, and broiling instead.  Cook with heart-healthy oils, such as olive, canola, soybean, or sunflower oil. Meal planning   Eat a balanced diet that includes: ? 5 or more servings of fruits and vegetables each day. At each meal, try to fill half of your plate with fruits and vegetables. ? Up to 6-8 servings of whole grains each day. ? Less than 6 oz of lean meat, poultry, or fish each day. A 3-oz serving of meat is about the same size as a deck of cards. One egg equals 1 oz. ? 2 servings of low-fat dairy each day. ? A serving of nuts, seeds, or beans 5 times each week. ? Heart-healthy fats. Healthy fats called Omega-3 fatty acids are found in foods such as flaxseeds and coldwater fish, like sardines, salmon, and mackerel.  Limit how much you eat of the following: ? Canned or prepackaged foods. ? Food that is high in trans fat, such as fried foods. ? Food that is high in saturated fat, such as fatty meat. ? Sweets, desserts, sugary drinks, and other foods with added sugar. ? Full-fat dairy products.  Do not salt foods before eating.  Try to eat at least 2 vegetarian meals each week.  Eat more home-cooked food and less restaurant,  buffet, and fast food.  When eating at a restaurant, ask that your food be prepared with less salt or no salt, if possible. What foods are recommended? The items listed may not be a complete list. Talk with your dietitian about what dietary choices are best for you. Grains Whole-grain or whole-wheat bread. Whole-grain or whole-wheat pasta. Brown rice. [place]. Bulgur. Whole-grain and low-sodium cereals. Pita bread. Low-fat, low-sodium crackers. Whole-wheat flour tortillas. Vegetables Fresh or  frozen vegetables (raw, steamed, roasted, or grilled). Low-sodium or reduced-sodium tomato and vegetable juice. Low-sodium or reduced-sodium tomato sauce and tomato paste. Low-sodium or reduced-sodium canned vegetables. Fruits All fresh, dried, or frozen fruit. Canned fruit in natural juice (without added sugar). Meat and other protein foods Skinless chicken or [place]. Ground chicken or [place]. Pork with fat trimmed off. Fish and seafood. Egg whites. Dried beans, peas, or lentils. Unsalted nuts, nut butters, and seeds. Unsalted canned beans. Lean cuts of beef with fat trimmed off. Low-sodium, lean deli meat. Dairy Low-fat (1%) or fat-free (skim) milk. Fat-free, low-fat, or reduced-fat cheeses. Nonfat, low-sodium ricotta or cottage cheese. Low-fat or nonfat yogurt. Low-fat, low-sodium cheese. Fats and oils Soft margarine without trans fats. Vegetable oil. Low-fat, reduced-fat, or light mayonnaise and salad dressings (reduced-sodium). Canola, safflower, olive, soybean, and sunflower oils. Avocado. Seasoning and other foods Herbs. Spices. Seasoning mixes without salt. Unsalted popcorn and pretzels. Fat-free sweets. What foods are not recommended? The items listed may not be a complete list. Talk with your dietitian about what dietary choices are best for you. Grains Baked goods made with fat, such as croissants, muffins, or some breads. Dry pasta or rice meal packs. Vegetables Creamed or fried vegetables. Vegetables in a cheese sauce. Regular canned vegetables (not low-sodium or reduced-sodium). Regular canned tomato sauce and paste (not low-sodium or reduced-sodium). Regular tomato and vegetable juice (not low-sodium or reduced-sodium). Angie Fava. Olives. Fruits Canned fruit in a light or heavy syrup. Fried fruit. Fruit in cream or butter sauce. Meat and other protein foods Fatty cuts of meat. Ribs. Fried meat. Berniece Salines. Sausage. Bologna and other processed lunch meats. Salami. Fatback. Hotdogs.  Bratwurst. Salted nuts and seeds. Canned beans with added salt. Canned or smoked fish. Whole eggs or egg yolks. Chicken or [place] with skin. Dairy Whole or 2% milk, cream, and half-and-half. Whole or full-fat cream cheese. Whole-fat or sweetened yogurt. Full-fat cheese. Nondairy creamers. Whipped toppings. Processed cheese and cheese spreads. Fats and oils Butter. Stick margarine. Lard. Shortening. Ghee. Bacon fat. Tropical oils, such as coconut, palm kernel, or palm oil. Seasoning and other foods Salted popcorn and pretzels. Onion salt, garlic salt, seasoned salt, table salt, and sea salt. Worcestershire sauce. Tartar sauce. Barbecue sauce. Teriyaki sauce. Soy sauce, including reduced-sodium. Steak sauce. Canned and packaged gravies. Fish sauce. Oyster sauce. Cocktail sauce. Horseradish that you find on the shelf. Ketchup. Mustard. Meat flavorings and tenderizers. Bouillon cubes. Hot sauce and Tabasco sauce. Premade or packaged marinades. Premade or packaged taco seasonings. Relishes. Regular salad dressings. Where to find more information:  National Heart, Lung, and [place]: https://wilson-eaton.com/  American Heart Association: www.heart.org Summary  The DASH eating plan is a healthy eating plan that has been shown to reduce high blood pressure (hypertension). It may also reduce your risk for type 2 diabetes, heart disease, and stroke.  With the DASH eating plan, you should limit salt (sodium) intake to 2,300 mg a day. If you have hypertension, you may need to reduce your sodium intake to 1,500 mg a day.  When  on the DASH eating plan, aim to eat more fresh fruits and vegetables, whole grains, lean proteins, low-fat dairy, and heart-healthy fats.  Work with your health care provider or diet and nutrition specialist (dietitian) to adjust your eating plan to your individual calorie needs. This information is not intended to replace advice given to you by your health care provider. Make sure you  discuss any questions you have with your health care provider. Document Released: 09/26/2011 Document Revised: 09/30/2016 Document Reviewed: 09/30/2016 Elsevier Interactive Patient Education  2017 [employer].

## 2017-08-29 NOTE — Progress Notes (Signed)
BP 112/68   Pulse 91   Temp 98.3 F (36.8 C) (Oral)   Resp 14   Wt 170 lb 8 oz (77.3 kg)   SpO2 97%   BMI 30.20 kg/m    Subjective:    Patient ID: Samantha Stephens, female    DOB: [birth year], 56 y.o.   MRN: [id number]  HPI: Samantha Stephens is a 56 y.o. female  Chief Complaint  Patient presents with  . ER follow-up    palpitations, potassium was low    HPI Patient is here for ER follow-up She went to the emergency department on October 30th She had symptoms the night before; thought maybe anxiety attack; came home and ate lunch, sitting there and aftrer husband left, she was getting ready to check the cat and go back to work; started pounding and went to the ER Her potassium was 3.3 Creatinine was up a little for her at 1.05 but BUN was 13 (normal); her GFR was still over 60; glucose was 151 CBC was stable over in tertms of microcytic, hypochromic indices; H/H 13.7/39.6 (normal) Troponin was <0.03 She started the potassium I sent in after seeing her in the ER per ER discharge note She has been feeling just a little off Called her husband about 3 weeks ago; just feels off; couldn't think right; not sure what exactly No excessive urination; no dry mouth; no blurred vision; no diarrhea Did try to do turmeric; joint aches; mother had bad RA; hx of vit D deficiency  Depression screen [facility] 2/9 08/29/2017 03/10/2017 12/04/2016 09/23/2016 09/09/2016  Decreased Interest 0 0 0 0 0  Down, Depressed, Hopeless 0 0 0 0 0  PHQ - 2 Score 0 0 0 0 0    Relevant past medical, surgical, family and social history reviewed Past Medical History:  Diagnosis Date  . Arthritis   . Chronic mid back pain 08/18/2015  . 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 Family hx of colon cancer requiring screening colonoscopy 03/08/2016  . Headache   . Hyperlipidemia   . Hypertension   . Hypokalemia   . Palpitations   . Scoliosis 03/08/2016  . Sleep apnea   . Stress headaches    Past  Surgical History:  Procedure Laterality Date  . ABDOMINAL HYSTERECTOMY    . CESAREAN SECTION     1  . COLONOSCOPY    . EYE SURGERY     lasix eye surgery   Family History  Problem Relation Age of Onset  . Arthritis Mother   . COPD Mother   . Depression Mother   . Diabetes Mother   . Hyperlipidemia Mother   . Hypertension Mother   . Cataracts Mother   . Heart disease Mother   . Cancer Father        kidney and prostate cancer, colon cancer  . Hearing loss Father   . Kidney disease Father   . Cataracts Father   . Asthma Sister   . Cancer Paternal Grandmother   . Mental illness Sister   . Breast cancer Other    Social History   Socioeconomic History  . Marital status: Married    Spouse name: Not on file  . Number of children: Not on file  . Years of education: Not on file  . Highest education level: Not on file  Social Needs  . Financial resource strain: Not on file  . Food insecurity - worry: Not on file  . Food insecurity - inability:  Not on file  . Transportation needs - medical: Not on file  . Transportation needs - non-medical: Not on file  Occupational History  . Not on file  Tobacco Use  . Smoking status: Former Research scientist (life sciences)  . Smokeless tobacco: Never Used  . Tobacco comment: but only for a short amount of time  Substance and Sexual Activity  . Alcohol use: Yes    Alcohol/week: 0.0 oz    Comment: occasional glass of wine  . Drug use: No  . Sexual activity: Yes    Partners: Male  Other Topics Concern  . Not on file  Social History Narrative  . Not on file    Interim medical history since last visit reviewed. Allergies and medications reviewed  Review of Systems Per HPI unless specifically indicated above     Objective:    BP 112/68   Pulse 91   Temp 98.3 F (36.8 C) (Oral)   Resp 14   Wt 170 lb 8 oz (77.3 kg)   SpO2 97%   BMI 30.20 kg/m   Wt Readings from Last 3 Encounters:  08/29/17 170 lb 8 oz (77.3 kg)  08/19/17 168 lb (76.2 kg)    03/10/17 165 lb 3.2 oz (74.9 kg)    Physical Exam  Constitutional: She appears well-developed and well-nourished. No distress.  HENT:  Head: Normocephalic and atraumatic.  Eyes: EOM are normal. No scleral icterus.  Neck: No thyromegaly present.  Cardiovascular: Normal rate, regular rhythm and normal heart sounds.  No murmur heard. Pulmonary/Chest: Effort normal and breath sounds normal. No respiratory distress. She has no wheezes.  Abdominal: Soft. Bowel sounds are normal. She exhibits no distension.  Musculoskeletal: Normal range of motion. She exhibits no edema.  Neurological: She is alert. She exhibits normal muscle tone.  Skin: Skin is warm and dry. She is not diaphoretic. No pallor.  Psychiatric: She has a normal mood and affect. Her behavior is normal. Judgment and thought content normal.   Results for orders placed or performed during the hospital encounter of 08/65/78  Basic metabolic panel  Result Value Ref Range   Sodium 138 135 - 145 mmol/L   Potassium 3.3 (L) 3.5 - 5.1 mmol/L   Chloride 101 101 - 111 mmol/L   CO2 28 22 - 32 mmol/L   Glucose, Bld 151 (H) 65 - 99 mg/dL   BUN 13 6 - 20 mg/dL   Creatinine, Ser 1.05 (H) 0.44 - 1.00 mg/dL   Calcium 9.5 8.9 - 10.3 mg/dL   GFR calc non Af Amer 58 (L) >60 mL/min   GFR calc Af Amer >60 >60 mL/min   Anion gap 9 5 - 15  CBC  Result Value Ref Range   WBC 8.5 3.6 - 11.0 K/uL   RBC 5.29 (H) 3.80 - 5.20 MIL/uL   Hemoglobin 13.7 12.0 - 16.0 g/dL   HCT 39.6 35.0 - 47.0 %   MCV 74.9 (L) 80.0 - 100.0 fL   MCH 25.8 (L) 26.0 - 34.0 pg   MCHC 34.5 32.0 - 36.0 g/dL   RDW 14.5 11.5 - 14.5 %   Platelets 278 150 - 440 K/uL  Troponin I  Result Value Ref Range   Troponin I <0.03 <0.03 ng/mL      Assessment & Plan:   Problem List Items Addressed This Visit      Cardiovascular and Mediastinum   Hypertension goal BP (blood pressure) < 140/90    Stop the thiazide diuretic; return in 10-14 days for recheck  off of the thiazide, just  on ARB; check BMP at that visit; try the DASH guidelines      Relevant Medications   losartan (COZAAR) 50 MG tablet    Other Visit Diagnoses    Hypokalemia    -  Primary   Screening for breast cancer       Relevant Orders   MM Digital Screening   Needs flu shot       Relevant Orders   Flu Vaccine QUAD 6+ mos PF IM (Fluarix Quad PF) (Completed)   Need for Tdap vaccination       Relevant Orders   Tdap vaccine greater than or equal to 7yo IM (Completed)       Follow up plan: Return in about 12 days (around 09/10/2017) for twenty minute follow-up with fasting labs.  An after-visit summary was printed and given to the patient at [place].  Please see the patient instructions which may contain other information and recommendations beyond what is mentioned above in the assessment and plan.  Meds ordered this encounter  Medications  . losartan (COZAAR) 50 MG tablet    Sig: Take 1 tablet (50 mg total) daily by mouth.    Dispense:  90 tablet    Refill:  3    Orders Placed This Encounter  Procedures  . MM Digital Screening  . Flu Vaccine QUAD 6+ mos PF IM (Fluarix Quad PF)  . Tdap vaccine greater than or equal to 7yo IM

## 2017-08-29 NOTE — Assessment & Plan Note (Signed)
Stop the thiazide diuretic; return in 10-14 days for recheck off of the thiazide, just on ARB; check BMP at that visit; try the DASH guidelines

## 2017-09-09 ENCOUNTER — Ambulatory Visit: Payer: 59 | Admitting: Family Medicine

## 2017-09-15 ENCOUNTER — Encounter: Payer: Self-pay | Admitting: Family Medicine

## 2017-09-15 ENCOUNTER — Ambulatory Visit: Payer: 59 | Admitting: Family Medicine

## 2017-09-15 VITALS — BP 122/76 | HR 79 | Temp 97.9°F | Resp 14 | Wt 170.6 lb

## 2017-09-15 DIAGNOSIS — I10 Essential (primary) hypertension: Secondary | ICD-10-CM

## 2017-09-15 DIAGNOSIS — Z9989 Dependence on other enabling machines and devices: Secondary | ICD-10-CM

## 2017-09-15 DIAGNOSIS — R739 Hyperglycemia, unspecified: Secondary | ICD-10-CM

## 2017-09-15 DIAGNOSIS — E785 Hyperlipidemia, unspecified: Secondary | ICD-10-CM

## 2017-09-15 DIAGNOSIS — M47816 Spondylosis without myelopathy or radiculopathy, lumbar region: Secondary | ICD-10-CM

## 2017-09-15 DIAGNOSIS — G4700 Insomnia, unspecified: Secondary | ICD-10-CM | POA: Diagnosis not present

## 2017-09-15 DIAGNOSIS — G4733 Obstructive sleep apnea (adult) (pediatric): Secondary | ICD-10-CM | POA: Diagnosis not present

## 2017-09-15 DIAGNOSIS — E876 Hypokalemia: Secondary | ICD-10-CM

## 2017-09-15 DIAGNOSIS — G8929 Other chronic pain: Secondary | ICD-10-CM

## 2017-09-15 DIAGNOSIS — M546 Pain in thoracic spine: Secondary | ICD-10-CM | POA: Diagnosis not present

## 2017-09-15 NOTE — Assessment & Plan Note (Addendum)
Check fasting lipids; I realize this is the Monday after Thanksgiving; continue statin

## 2017-09-15 NOTE — Assessment & Plan Note (Addendum)
Will try to get new machine through [facility]; patient's weight has been stable so hopefully will not have to repeat the whole sleep study

## 2017-09-15 NOTE — Assessment & Plan Note (Signed)
Controlled without medicine; she'll work on healthier eating and weight

## 2017-09-15 NOTE — Assessment & Plan Note (Signed)
Thoracic osteophytes; reviewed xray; activity, "T"s such as tylenol, temperature, topicals, therapy, tai chi, etc 

## 2017-09-15 NOTE — Patient Instructions (Signed)
Let's get labs today Try to limit saturated fats in your diet (bologna, hot dogs, barbeque, cheeseburgers, hamburgers, steak, bacon, sausage, cheese, etc.) and get more fresh fruits, vegetables, and whole grains

## 2017-09-15 NOTE — Assessment & Plan Note (Signed)
Doing well on melatonin

## 2017-09-15 NOTE — Progress Notes (Signed)
BP 122/76   Pulse 79   Temp 97.9 F (36.6 C) (Oral)   Resp 14   Wt 170 lb 9.6 oz (77.4 kg)   SpO2 97%   BMI 30.22 kg/m    Subjective:    Patient ID: Samantha Stephens, female    DOB: 04/12/1961, 56 y.o.   MRN: [id number]  HPI: Samantha Stephens is a 56 y.o. female  Chief Complaint  Patient presents with  . Follow-up    HPI Patient is here for f/u She was just here on 08/29/17 after ER visit Note reviewed Her BP medicine was stopped and she is fasting for labs No skipped beats since changing up therapy Just an occasional headache on the right, thinks maybe when hungry or sleepy; bothers her sometimes; just comes and goes; straining to look at the screen; had her eyes checked; wears reading glasses without her contacts She is a pretty good water drinker, might could do better Standing desk, adjustable and good height  Back and hip still bother her, had xrays done a few years ago ---------------------------------------------------------- Sept 2015 xrays: THORACIC SPINE - 3 VIEW   COMPARISON:  Chest radiograph October 20, 2012   FINDINGS:  Frontal, lateral, and swimmer's views were obtained. There is no  fracture or spondylolisthesis. There are multiple right-sided  lateral osteophytes. There is rather minimal disc space narrowing at  several levels in the mid and lower thoracic region. There is also  osteoarthritic change in the lower cervical spine.   IMPRESSION:  Areas of osteoarthritic change.  No fracture or spondylolisthesis.    Electronically Signed    By: Lowella Grip M.D.    On: 07/14/2014 08:10  --------------------------------------------------------- CLINICAL DATA:  Low back pain.   EXAM:  LUMBAR SPINE - COMPLETE 4+ VIEW   COMPARISON:  None.   FINDINGS:  Paraspinal soft tissues are normal. Mild scoliosis concave left. No  acute bony abnormality identified. Mild diffuse degenerative change.  Pelvic phleboliths.   IMPRESSION:    Mild diffuse degenerative change. Mild scoliosis concave left. No  acute abnormality.    Electronically Signed    By: Marcello Moores  Register    On: 07/14/2014 08:11  ---------------------------------------------------------  High cholesterol Taking statin; it is right after Thanksgiving, so provider is taking this into account Lab Results  Component Value Date   CHOL 166 03/10/2017   CHOL 158 09/09/2016   CHOL 151 03/29/2016   Lab Results  Component Value Date   HDL 54 03/10/2017   HDL 48 09/09/2016   HDL 51 03/29/2016   Lab Results  Component Value Date   LDLCALC 80 03/10/2017   LDLCALC 66 09/09/2016   LDLCALC 72 03/29/2016   Lab Results  Component Value Date   TRIG 160 (H) 03/10/2017   TRIG 221 (H) 09/09/2016   TRIG 142 03/29/2016   Lab Results  Component Value Date   CHOLHDL 3.1 03/10/2017   CHOLHDL 3.3 09/09/2016   No results found for: LDLDIRECT  Insomnia; taking melatonin  Low potassium; ate a banana; stopped thiazide and going to have this rechecked today  I noted a little elevation of glucose  OSA; has an old CPAP; patient can tell if she has not fallen asleep with it, will wake up with a headache; old machine; has machine at home, not sure of her settings  Depression screen [facility] 2/9 09/15/2017 08/29/2017 03/10/2017 12/04/2016 09/23/2016  Decreased Interest 0 0 0 0 0  Down, Depressed, Hopeless 0 0 0 0 0  PHQ - 2 Score 0 0 0 0 0    Relevant past medical, surgical, family and social history reviewed Past Medical History:  Diagnosis Date  . Arthritis   . Chronic mid back pain 08/18/2015  . 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 Family hx of colon cancer requiring screening colonoscopy 03/08/2016  . Headache   . Hyperlipidemia   . Hypertension   . Hypokalemia   . Palpitations   . Scoliosis 03/08/2016  . Sleep apnea   . Stress headaches    Past Surgical History:  Procedure Laterality Date  . ABDOMINAL HYSTERECTOMY    . CESAREAN SECTION      1  . COLONOSCOPY    . CYSTOSCOPY W/ RETROGRADES Bilateral 11/25/2016   Procedure: CYSTOSCOPY WITH RETROGRADE PYELOGRAM;  Surgeon: Hollice Espy, MD;  Location: [facility] ORS;  Service: Urology;  Laterality: Bilateral;  . EYE SURGERY     lasix eye surgery   Family History  Problem Relation Age of Onset  . Arthritis Mother   . COPD Mother   . Depression Mother   . Diabetes Mother   . Hyperlipidemia Mother   . Hypertension Mother   . Cataracts Mother   . Heart disease Mother   . Cancer Father        kidney and prostate cancer, colon cancer  . Hearing loss Father   . Kidney disease Father   . Cataracts Father   . Asthma Sister   . Cancer Paternal Grandmother   . Mental illness Sister   . Breast cancer Other    Social History   Tobacco Use  . Smoking status: Former Research scientist (life sciences)  . Smokeless tobacco: Never Used  . Tobacco comment: but only for a short amount of time  Substance Use Topics  . Alcohol use: Yes    Alcohol/week: 0.0 oz    Comment: occasional glass of wine  . Drug use: No    Interim medical history since last visit reviewed. Allergies and medications reviewed  Review of Systems Per HPI unless specifically indicated above     Objective:    BP 122/76   Pulse 79   Temp 97.9 F (36.6 C) (Oral)   Resp 14   Wt 170 lb 9.6 oz (77.4 kg)   SpO2 97%   BMI 30.22 kg/m   Wt Readings from Last 3 Encounters:  09/15/17 170 lb 9.6 oz (77.4 kg)  08/29/17 170 lb 8 oz (77.3 kg)  08/19/17 168 lb (76.2 kg)    Physical Exam  Constitutional: She appears well-developed and well-nourished. No distress.  Eyes: EOM are normal. No scleral icterus.  Cardiovascular: Normal rate, regular rhythm and normal heart sounds.  No extrasystoles are present.  Pulmonary/Chest: Effort normal and breath sounds normal. No respiratory distress. She has no wheezes.  Abdominal: She exhibits no distension.  Musculoskeletal: Normal range of motion. She exhibits no edema.  Neurological: She is alert.  She exhibits normal muscle tone.  Skin: Skin is warm. She is not diaphoretic. No pallor.  Psychiatric: She has a normal mood and affect.    Results for orders placed or performed during the hospital encounter of 56/43/32  Basic metabolic panel  Result Value Ref Range   Sodium 138 135 - 145 mmol/L   Potassium 3.3 (L) 3.5 - 5.1 mmol/L   Chloride 101 101 - 111 mmol/L   CO2 28 22 - 32 mmol/L   Glucose, Bld 151 (H) 65 - 99 mg/dL   BUN 13 6 - 20  mg/dL   Creatinine, Ser 1.05 (H) 0.44 - 1.00 mg/dL   Calcium 9.5 8.9 - 10.3 mg/dL   GFR calc non Af Amer 58 (L) >60 mL/min   GFR calc Af Amer >60 >60 mL/min   Anion gap 9 5 - 15  CBC  Result Value Ref Range   WBC 8.5 3.6 - 11.0 K/uL   RBC 5.29 (H) 3.80 - 5.20 MIL/uL   Hemoglobin 13.7 12.0 - 16.0 g/dL   HCT 39.6 35.0 - 47.0 %   MCV 74.9 (L) 80.0 - 100.0 fL   MCH 25.8 (L) 26.0 - 34.0 pg   MCHC 34.5 32.0 - 36.0 g/dL   RDW 14.5 11.5 - 14.5 %   Platelets 278 150 - 440 K/uL  Troponin I  Result Value Ref Range   Troponin I <0.03 <0.03 ng/mL      Assessment & Plan:   Problem List Items Addressed This Visit      Cardiovascular and Mediastinum   Hypertension goal BP (blood pressure) < 140/90    Controlled without medicine; she'll work on healthier eating and weight        Respiratory   OSA on CPAP    Will try to get new machine through [facility]; patient's weight has been stable so hopefully will not have to repeat the whole sleep study      Relevant Orders   CPAP     Musculoskeletal and Integument   Degenerative arthritis of lumbar spine    Try the "T"s, stay active        Other   Insomnia, controlled    Doing well on melatonin      Hyperlipidemia LDL goal <100    Check fasting lipids; I realize this is the Monday after Thanksgiving; continue statin      Relevant Orders   Lipid panel   Lipid panel   Chronic mid back pain    Thoracic osteophytes; reviewed xray; activity, "T"s such as tylenol, temperature, topicals,  therapy, tai chi, etc       Other Visit Diagnoses    Hypokalemia    -  Primary   Relevant Orders   Comprehensive metabolic panel   Comprehensive metabolic panel   Hyperglycemia       Relevant Orders   Hemoglobin A1c   Hemoglobin A1c       Follow up plan: Return in about 6 months (around 03/15/2018) for twenty minute follow-up with fasting labs.  An after-visit summary was printed and given to the patient at [place].  Please see the patient instructions which may contain other information and recommendations beyond what is mentioned above in the assessment and plan.  No orders of the defined types were placed in this encounter.   Orders Placed This Encounter  Procedures  . Hemoglobin A1c  . Lipid panel  . Comprehensive metabolic panel  . Comprehensive metabolic panel  . Hemoglobin A1c  . Lipid panel  . CPAP

## 2017-09-15 NOTE — Assessment & Plan Note (Signed)
Try the "T"s, stay active

## 2017-09-27 LAB — COMPREHENSIVE METABOLIC PANEL
ALBUMIN: 4.8 g/dL (ref 3.5–5.5)
ALT: 24 IU/L (ref 0–32)
AST: 26 IU/L (ref 0–40)
Albumin/Globulin Ratio: 2 (ref 1.2–2.2)
Alkaline Phosphatase: 84 IU/L (ref 39–117)
BUN / CREAT RATIO: 13 (ref 9–23)
BUN: 11 mg/dL (ref 6–24)
Bilirubin Total: <0.2 mg/dL (ref 0.0–1.2)
CO2: 27 mmol/L (ref 20–29)
CREATININE: 0.88 mg/dL (ref 0.57–1.00)
Calcium: 9.7 mg/dL (ref 8.7–10.2)
Chloride: 103 mmol/L (ref 96–106)
GFR calc non Af Amer: 73 mL/min/{1.73_m2} (ref >59)
GFR, EST AFRICAN AMERICAN: 84 mL/min/{1.73_m2} (ref >59)
GLUCOSE: 80 mg/dL (ref 65–99)
Globulin, Total: 2.4 g/dL (ref 1.5–4.5)
Potassium: 4.1 mmol/L (ref 3.5–5.2)
Sodium: 144 mmol/L (ref 134–144)
TOTAL PROTEIN: 7.2 g/dL (ref 6.0–8.5)

## 2017-09-27 LAB — LIPID PANEL
Chol/HDL Ratio: 3.2 ratio (ref 0.0–4.4)
Cholesterol, Total: 162 mg/dL (ref 100–199)
HDL: 51 mg/dL (ref >39)
LDL Calculated: 63 mg/dL (ref 0–99)
Triglycerides: 238 mg/dL — ABNORMAL HIGH (ref 0–149)
VLDL Cholesterol Cal: 48 mg/dL — ABNORMAL HIGH (ref 5–40)

## 2017-09-27 LAB — HEMOGLOBIN A1C
Est. average glucose Bld gHb Est-mCnc: 151 mg/dL
Hgb A1c MFr Bld: 6.9 % — ABNORMAL HIGH (ref 4.8–5.6)

## 2017-09-29 ENCOUNTER — Encounter: Payer: Self-pay | Admitting: Family Medicine

## 2017-09-29 DIAGNOSIS — R7309 Other abnormal glucose: Secondary | ICD-10-CM | POA: Insufficient documentation

## 2017-10-08 ENCOUNTER — Telehealth: Payer: Self-pay

## 2017-10-08 ENCOUNTER — Other Ambulatory Visit: Payer: Self-pay

## 2017-10-08 DIAGNOSIS — Z9989 Dependence on other enabling machines and devices: Principal | ICD-10-CM

## 2017-10-08 DIAGNOSIS — G4733 Obstructive sleep apnea (adult) (pediatric): Secondary | ICD-10-CM

## 2017-10-08 NOTE — Telephone Encounter (Signed)
A message was left on this patient's voicemail letting her know what had happen with the c-pap order and why there was a delay. I also asked her to give [place] a call back if she has not heard from them within 10-14 days as requested by Dr. Sanda Klein.

## 2017-10-08 NOTE — Telephone Encounter (Signed)
Found it as a Future order.   I have sent it over to [facility] at [phone].  Confirmation received.

## 2017-10-08 NOTE — Telephone Encounter (Signed)
Please see her last office visit I thought I put in an order, but perhaps I didn't put it in right If you can please get that over to [facility], I would appreciate it Thank you

## 2017-10-08 NOTE — Telephone Encounter (Signed)
Thank you Please let patient know it was sent, why the delay, and we're glad she called to follow-up on it Let us know if still not taken care of in 10-14 days

## 2017-10-08 NOTE — Telephone Encounter (Signed)
Copied from [place] [phone]. Topic: Inquiry >> Oct 08, 2017  9:59 AM Aurelio Brash B wrote: Reason for CRM:  Pt wants to know  if order for new c-pap has been sent to  [facility]  , she contacted the facility  but they told her they have not got request.   [phone]

## 2017-11-07 ENCOUNTER — Ambulatory Visit
Admission: RE | Admit: 2017-11-07 | Discharge: 2017-11-07 | Disposition: A | Discharge status: Home / Self Care | Payer: Managed Care, Other (non HMO) | Source: Ambulatory Visit | Attending: Family Medicine | Admitting: Family Medicine

## 2017-11-07 DIAGNOSIS — Z1239 Encounter for other screening for malignant neoplasm of breast: Secondary | ICD-10-CM

## 2017-11-07 DIAGNOSIS — Z1231 Encounter for screening mammogram for malignant neoplasm of breast: Secondary | ICD-10-CM | POA: Diagnosis not present

## 2017-11-27 ENCOUNTER — Other Ambulatory Visit: Payer: Self-pay

## 2017-11-27 MED ORDER — LOSARTAN POTASSIUM 50 MG PO TABS: 50.0000 mg | ORAL_TABLET | Freq: Every day | ORAL | 2 refills | Status: DC

## 2017-11-27 NOTE — Telephone Encounter (Signed)
90 day new pharmacy

## 2017-12-11 ENCOUNTER — Telehealth: Payer: Self-pay | Admitting: Family Medicine

## 2017-12-11 MED ORDER — VALSARTAN 80 MG PO TABS
80.0000 mg | ORAL_TABLET | Freq: Every day | ORAL | 5 refills | Status: DC
Start: 2017-12-11 — End: 2018-01-06

## 2017-12-11 NOTE — Telephone Encounter (Signed)
Pt notified of medication change and scheduled for BP check and BMP 12/24/17. PT verbalized understanding.

## 2017-12-11 NOTE — Telephone Encounter (Signed)
Please let patient know that her losartan was part of a drug recall STOP the losartan and start new medicine, Diovan (valsartan) Return for CMA visit in 2 weeks and please order BMP, medication monitoring

## 2017-12-24 ENCOUNTER — Ambulatory Visit: Payer: Managed Care, Other (non HMO)

## 2017-12-24 ENCOUNTER — Telehealth: Payer: Self-pay

## 2017-12-24 VITALS — BP 132/84 | HR 82

## 2017-12-24 DIAGNOSIS — I10 Essential (primary) hypertension: Secondary | ICD-10-CM

## 2017-12-24 DIAGNOSIS — Z5181 Encounter for therapeutic drug level monitoring: Secondary | ICD-10-CM

## 2017-12-24 NOTE — Telephone Encounter (Signed)
That's fine; thank you We'll continue the 80 mg diovan (valsartan) and see what the labs show

## 2017-12-24 NOTE — Addendum Note (Signed)
Addended by: Docia Furl on: 12/24/2017 11:27 AM   Modules accepted: Orders

## 2017-12-24 NOTE — Telephone Encounter (Signed)
Pt came for bp check today since starting new med Diovan.  Bp today was 132/84 and bmp was gotten.  Do you want to adjust anything?

## 2018-01-06 ENCOUNTER — Encounter: Payer: Self-pay | Admitting: Family Medicine

## 2018-01-06 NOTE — Telephone Encounter (Signed)
I called patient since I don't know what "very high" means I left message and will send MyChart note Start taking 160 mg of diovan If BP very high, over 200, or if any CP, SHOB, terrible headache, etc, then go to urgent care or ER I see she has appt with NP tomorrow; keep that

## 2018-01-07 ENCOUNTER — Ambulatory Visit: Payer: Managed Care, Other (non HMO) | Admitting: Family Medicine

## 2018-01-07 ENCOUNTER — Encounter: Payer: Self-pay | Admitting: Family Medicine

## 2018-01-07 VITALS — BP 132/84 | HR 85 | Temp 98.6°F | Resp 16 | Ht 64.0 in | Wt 169.1 lb

## 2018-01-07 DIAGNOSIS — R7303 Prediabetes: Secondary | ICD-10-CM

## 2018-01-07 DIAGNOSIS — I10 Essential (primary) hypertension: Secondary | ICD-10-CM | POA: Diagnosis not present

## 2018-01-07 LAB — BASIC METABOLIC PANEL
BUN / CREAT RATIO: 12 (ref 9–23)
BUN: 11 mg/dL (ref 6–24)
CO2: 25 mmol/L (ref 20–29)
CREATININE: 0.94 mg/dL (ref 0.57–1.00)
Calcium: 9.7 mg/dL (ref 8.7–10.2)
Chloride: 103 mmol/L (ref 96–106)
GFR, EST AFRICAN AMERICAN: 78 mL/min/{1.73_m2} (ref >59)
GFR, EST NON AFRICAN AMERICAN: 68 mL/min/{1.73_m2} (ref >59)
Glucose: 93 mg/dL (ref 65–99)
POTASSIUM: 4.4 mmol/L (ref 3.5–5.2)
SODIUM: 143 mmol/L (ref 134–144)

## 2018-01-07 NOTE — Progress Notes (Addendum)
Name: Samantha Stephens   MRN: [id number]    DOB: Feb 15, 1961   Date:01/07/2018       Progress Note  Subjective  Chief Complaint  Chief Complaint  Patient presents with  . Hypertension    elevated BP 168/119, medication was changed, mild headache, increased Diovan to 160 on last night    HPI  PT presents with concern for elevated BP readings.  She notes at home her BP was 168/119 at home yesterday, she spoke with PCP Dr. Sanda Klein who increased her Diovan from 27m to 1641m- pt did this yesterday x1 dose and her BP today is at goal in office.  She states she is feeling better today than she did yesterday and the day before.  - She has been under a lot of increased stress lately - work, home, family/extended family, taxes, etc; younger sister is schizophrenic and this has caused increased stress lately. - Diet - she has been trying to pay better attention to her diet. Eating more regular meals with healthier options - vegetable/meat/starch.  Not adding salt at the table.  Discussed DASH diet in detail - see AVS. - She notes some increased urinary frequency; she denies polyphagia or polydipsia. She has prediabetes - kidney function checked yesterday 01/06/18 and is WNL, last A1C was 09/26/2017 and was 6.9%.  Does not take medications at this time.   - Endorses ongoing neck muscle tightness (she attributes some of this to stress) and posterior headache that feels tense and can be relieved with massage.   - Denies dysuria, hematuria, flank/back pain/abdominal pain, NVD, vision changes, chest pain, shortness of breath, numbness/tingling, extremity weakness, speech difficulties, confusion.   Patient Active Problem List   Diagnosis Date Noted  . Elevated hemoglobin A1c 09/29/2017  . OSA on CPAP 09/15/2017  . Hepatitis C antibody test positive 03/12/2017  . Preventative health care 03/10/2017  . Hematuria, gross 11/12/2016  . Medication monitoring encounter 09/09/2016  . Chronic hip pain, right  09/09/2016  . Numbness of right foot 09/09/2016  . Right lower quadrant abdominal pain 09/09/2016  . Scoliosis 03/08/2016  . 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 Family hx of colon cancer requiring screening colonoscopy 03/08/2016  . Situational anxiety 10/27/2015  . Hypertension goal BP (blood pressure) < 140/90 08/18/2015  . Hyperlipidemia LDL goal <100 08/18/2015  . Insomnia, controlled 08/18/2015  . Chronic mid back pain 08/18/2015    Social History   Tobacco Use  . Smoking status: Former SmResearch scientist (life sciences). Smokeless tobacco: Never Used  . Tobacco comment: but only for a short amount of time  Substance Use Topics  . Alcohol use: Yes    Alcohol/week: 0.0 oz    Comment: occasional glass of wine     Current Outpatient Medications:  .  aspirin EC 81 MG tablet, Take 1 tablet (81 mg total) by mouth daily., Disp: , Rfl:  .  Cholecalciferol (VITAMIN D3) 2000 units TABS, Take 1 capsule by mouth daily., Disp: , Rfl:  .  estradiol (ESTRACE VAGINAL) 0.1 MG/GM vaginal cream, Place 1 Applicatorful vaginally 2 (two) times a week., Disp: 127.5 g, Rfl: 3 .  Melatonin 5 MG CAPS, Take 5 mg by mouth daily., Disp: , Rfl:  .  Multiple Vitamin (MULTIVITAMIN) tablet, Take 1 tablet by mouth daily., Disp: , Rfl:  .  simvastatin (ZOCOR) 40 MG tablet, TAKE 1 TABLET BY MOUTH AT  BEDTIME, Disp: 90 tablet, Rfl: 1 .  tobramycin-dexamethasone (TOBRADEX) ophthalmic solution, , Disp: ,  Rfl:  .  valsartan (DIOVAN) 80 MG tablet, Take 2 tablets (160 mg total) by mouth daily. (this replaces losartan) -- message left for pt Tues evening 6 pm, Disp: , Rfl:  .  albuterol (PROVENTIL HFA;VENTOLIN HFA) 108 (90 Base) MCG/ACT inhaler, Inhale 2 puffs into the lungs every 4 (four) hours as needed for wheezing or shortness of breath. (Patient not taking: Reported on 01/07/2018), Disp: 1 Inhaler, Rfl: 1  Allergies  Allergen Reactions  . Trazodone And Nefazodone Other (See Comments)    Syncope; see in ER Feb 2018     ROS  Constitutional: Negative for fever or weight change.  Respiratory: Negative for cough and shortness of breath.   Cardiovascular: Negative for chest pain or palpitations.  Gastrointestinal: Negative for abdominal pain, no bowel changes.  Musculoskeletal: Negative for gait problem or joint swelling.  Skin: Negative for rash.  Neurological: Negative for dizziness. No other specific complaints in a complete review of systems (except as listed in HPI above).  Objective  Vitals:   01/07/18 0748  BP: 132/84  Pulse: 85  Resp: 16  Temp: 98.6 F (37 C)  SpO2: 95%  Weight: 169 lb 1.6 oz (76.7 kg)  Height: 5' 4"  (1.626 m)   Body mass index is 29.03 kg/m.  Nursing Note and Vital Signs reviewed.  Physical Exam Constitutional: Patient appears well-developed and well-nourished. Obese. No distress.  HEENT: head atraumatic, normocephalic, pupils equal and reactive to light, EOM's intact, neck supple without lymphadenopathy, oropharynx pink and moist without exudate Cardiovascular: Normal rate, regular rhythm, S1/S2 present.  No murmur or rub heard. No BLE edema. Pulmonary/Chest: Effort normal and breath sounds clear. No respiratory distress or retractions. Abdominal: Soft and non-tender, bowel sounds present x4 quadrants.  No CVA Tenderness Psychiatric: Patient has a normal mood and affect. behavior is normal. Judgment and thought content normal. Musculoskeletal: Normal range of motion, no joint effusions. No gross deformities Neurological: he is alert and oriented to person, place, and time. No cranial nerve deficit. Coordination, balance, strength, speech and gait are normal.  Skin: Skin is warm and dry. No rash noted. No erythema. .   Results for orders placed or performed in visit on 12/24/17 (from the past 72 hour(s))  Basic Metabolic Panel (BMET)     Status: None   Collection Time: 01/06/18  4:36 PM  Result Value Ref Range   Glucose 93 65 - 99 mg/dL   BUN 11 6 - 24 mg/dL    Creatinine, Ser 0.94 0.57 - 1.00 mg/dL   GFR calc non Af Amer 68 >59 mL/min/1.73   GFR calc Af Amer 78 >59 mL/min/1.73   BUN/Creatinine Ratio 12 9 - 23   Sodium 143 134 - 144 mmol/L   Potassium 4.4 3.5 - 5.2 mmol/L   Chloride 103 96 - 106 mmol/L   CO2 25 20 - 29 mmol/L   Calcium 9.7 8.7 - 10.2 mg/dL    Assessment & Plan  1. Hypertension goal BP (blood pressure) < 140/90 - Discussed DASH diet in detail. - BP is at goal today, so we will maintain at 176m Diovan until 2 week BP check.  Will make adjustments if needed. - Signs and symptoms of stroke and MI are discussed in detail with pt teach back performed.  2. Prediabetes - Discussed nutrition in detail, advised that if urinary frequency worsens or additional symptoms present, she should return for further evaluation.  -Red flags and when to present for emergency care or RTC including fever >101.35F,  chest pain, shortness of breath, signs and symptoms of stroke new/worsening/un-resolving symptoms, reviewed with patient at time of visit. Follow up and care instructions discussed and provided in AVS.

## 2018-01-07 NOTE — Patient Instructions (Signed)
DASH Eating Plan DASH stands for "Dietary Approaches to Stop Hypertension." The DASH eating plan is a healthy eating plan that has been shown to reduce high blood pressure (hypertension). It may also reduce your risk for type 2 diabetes, heart disease, and stroke. The DASH eating plan may also help with weight loss. What are tips for following this plan? General guidelines  Avoid eating more than 2,300 mg (milligrams) of salt (sodium) a day. If you have hypertension, you may need to reduce your sodium intake to 1,500 mg a day.  Limit alcohol intake to no more than 1 drink a day for nonpregnant women and 2 drinks a day for men. One drink equals 12 oz of beer, 5 oz of wine, or 1 oz of hard liquor.  Work with your health care provider to maintain a healthy body weight or to lose weight. Ask what an ideal weight is for you.  Get at least 30 minutes of exercise that causes your heart to beat faster (aerobic exercise) most days of the week. Activities may include walking, swimming, or biking.  Work with your health care provider or diet and nutrition specialist (dietitian) to adjust your eating plan to your individual calorie needs. Reading food labels  Check food labels for the amount of sodium per serving. Choose foods with less than 5 percent of the Daily Value of sodium. Generally, foods with less than 300 mg of sodium per serving fit into this eating plan.  To find whole grains, look for the word "whole" as the first word in the ingredient list. Shopping  Buy products labeled as "low-sodium" or "no salt added."  Buy fresh foods. Avoid canned foods and premade or frozen meals. Cooking  Avoid adding salt when cooking. Use salt-free seasonings or herbs instead of table salt or sea salt. Check with your health care provider or pharmacist before using salt substitutes.  Do not fry foods. Cook foods using healthy methods such as baking, boiling, grilling, and broiling instead.  Cook with  heart-healthy oils, such as olive, canola, soybean, or sunflower oil. Meal planning   Eat a balanced diet that includes: ? 5 or more servings of fruits and vegetables each day. At each meal, try to fill half of your plate with fruits and vegetables. ? Up to 6-8 servings of whole grains each day. ? Less than 6 oz of lean meat, poultry, or fish each day. A 3-oz serving of meat is about the same size as a deck of cards. One egg equals 1 oz. ? 2 servings of low-fat dairy each day. ? A serving of nuts, seeds, or beans 5 times each week. ? Heart-healthy fats. Healthy fats called Omega-3 fatty acids are found in foods such as flaxseeds and coldwater fish, like sardines, salmon, and mackerel.  Limit how much you eat of the following: ? Canned or prepackaged foods. ? Food that is high in trans fat, such as fried foods. ? Food that is high in saturated fat, such as fatty meat. ? Sweets, desserts, sugary drinks, and other foods with added sugar. ? Full-fat dairy products.  Do not salt foods before eating.  Try to eat at least 2 vegetarian meals each week.  Eat more home-cooked food and less restaurant, buffet, and fast food.  When eating at a restaurant, ask that your food be prepared with less salt or no salt, if possible. What foods are recommended? The items listed may not be a complete list. Talk with your dietitian about what   dietary choices are best for you. Grains Whole-grain or whole-wheat bread. Whole-grain or whole-wheat pasta. Brown rice. Oatmeal. Quinoa. Bulgur. Whole-grain and low-sodium cereals. Pita bread. Low-fat, low-sodium crackers. Whole-wheat flour tortillas. Vegetables Fresh or frozen vegetables (raw, steamed, roasted, or grilled). Low-sodium or reduced-sodium tomato and vegetable juice. Low-sodium or reduced-sodium tomato sauce and tomato paste. Low-sodium or reduced-sodium canned vegetables. Fruits All fresh, dried, or frozen fruit. Canned fruit in natural juice (without  added sugar). Meat and other protein foods Skinless chicken or turkey. Ground chicken or turkey. Pork with fat trimmed off. Fish and seafood. Egg whites. Dried beans, peas, or lentils. Unsalted nuts, nut butters, and seeds. Unsalted canned beans. Lean cuts of beef with fat trimmed off. Low-sodium, lean deli meat. Dairy Low-fat (1%) or fat-free (skim) milk. Fat-free, low-fat, or reduced-fat cheeses. Nonfat, low-sodium ricotta or cottage cheese. Low-fat or nonfat yogurt. Low-fat, low-sodium cheese. Fats and oils Soft margarine without trans fats. Vegetable oil. Low-fat, reduced-fat, or light mayonnaise and salad dressings (reduced-sodium). Canola, safflower, olive, soybean, and sunflower oils. Avocado. Seasoning and other foods Herbs. Spices. Seasoning mixes without salt. Unsalted popcorn and pretzels. Fat-free sweets. What foods are not recommended? The items listed may not be a complete list. Talk with your dietitian about what dietary choices are best for you. Grains Baked goods made with fat, such as croissants, muffins, or some breads. Dry pasta or rice meal packs. Vegetables Creamed or fried vegetables. Vegetables in a cheese sauce. Regular canned vegetables (not low-sodium or reduced-sodium). Regular canned tomato sauce and paste (not low-sodium or reduced-sodium). Regular tomato and vegetable juice (not low-sodium or reduced-sodium). Pickles. Olives. Fruits Canned fruit in a light or heavy syrup. Fried fruit. Fruit in cream or butter sauce. Meat and other protein foods Fatty cuts of meat. Ribs. Fried meat. Bacon. Sausage. Bologna and other processed lunch meats. Salami. Fatback. Hotdogs. Bratwurst. Salted nuts and seeds. Canned beans with added salt. Canned or smoked fish. Whole eggs or egg yolks. Chicken or turkey with skin. Dairy Whole or 2% milk, cream, and half-and-half. Whole or full-fat cream cheese. Whole-fat or sweetened yogurt. Full-fat cheese. Nondairy creamers. Whipped toppings.  Processed cheese and cheese spreads. Fats and oils Butter. Stick margarine. Lard. Shortening. Ghee. Bacon fat. Tropical oils, such as coconut, palm kernel, or palm oil. Seasoning and other foods Salted popcorn and pretzels. Onion salt, garlic salt, seasoned salt, table salt, and sea salt. Worcestershire sauce. Tartar sauce. Barbecue sauce. Teriyaki sauce. Soy sauce, including reduced-sodium. Steak sauce. Canned and packaged gravies. Fish sauce. Oyster sauce. Cocktail sauce. Horseradish that you find on the shelf. Ketchup. Mustard. Meat flavorings and tenderizers. Bouillon cubes. Hot sauce and Tabasco sauce. Premade or packaged marinades. Premade or packaged taco seasonings. Relishes. Regular salad dressings. Where to find more information:  National Heart, Lung, and Blood Institute: www.nhlbi.nih.gov  American Heart Association: www.heart.org Summary  The DASH eating plan is a healthy eating plan that has been shown to reduce high blood pressure (hypertension). It may also reduce your risk for type 2 diabetes, heart disease, and stroke.  With the DASH eating plan, you should limit salt (sodium) intake to 2,300 mg a day. If you have hypertension, you may need to reduce your sodium intake to 1,500 mg a day.  When on the DASH eating plan, aim to eat more fresh fruits and vegetables, whole grains, lean proteins, low-fat dairy, and heart-healthy fats.  Work with your health care provider or diet and nutrition specialist (dietitian) to adjust your eating plan to your individual   calorie needs. This information is not intended to replace advice given to you by your health care provider. Make sure you discuss any questions you have with your health care provider. Document Released: 09/26/2011 Document Revised: 09/30/2016 Document Reviewed: 09/30/2016 Elsevier Interactive Patient Education  2018 [employer]  

## 2018-01-10 ENCOUNTER — Other Ambulatory Visit: Payer: Self-pay | Admitting: Family Medicine

## 2018-01-12 NOTE — Telephone Encounter (Signed)
Last sgpt and lipids reviewed; Rx approved

## 2018-01-14 ENCOUNTER — Other Ambulatory Visit: Payer: Self-pay

## 2018-01-14 MED ORDER — VALSARTAN 160 MG PO TABS: 160.0000 mg | ORAL_TABLET | Freq: Every day | ORAL | 0 refills | Status: DC

## 2018-01-21 ENCOUNTER — Ambulatory Visit: Payer: Managed Care, Other (non HMO) | Admitting: Emergency Medicine

## 2018-01-21 VITALS — BP 132/84

## 2018-01-21 DIAGNOSIS — I10 Essential (primary) hypertension: Secondary | ICD-10-CM

## 2018-01-22 ENCOUNTER — Encounter: Payer: Self-pay | Admitting: Family Medicine

## 2018-01-22 ENCOUNTER — Ambulatory Visit: Payer: Managed Care, Other (non HMO) | Admitting: Family Medicine

## 2018-01-22 VITALS — BP 126/82 | HR 94 | Temp 98.4°F | Resp 14 | Ht 63.5 in | Wt 168.3 lb

## 2018-01-22 DIAGNOSIS — I10 Essential (primary) hypertension: Secondary | ICD-10-CM | POA: Diagnosis not present

## 2018-01-22 DIAGNOSIS — F40232 Fear of other medical care: Secondary | ICD-10-CM

## 2018-01-22 DIAGNOSIS — R51 Headache: Secondary | ICD-10-CM | POA: Diagnosis not present

## 2018-01-22 DIAGNOSIS — R519 Headache, unspecified: Secondary | ICD-10-CM

## 2018-01-22 MED ORDER — PREDNISONE 20 MG PO TABS: 20.0000 mg | ORAL_TABLET | Freq: Every day | ORAL | 0 refills | Status: DC

## 2018-01-22 MED ORDER — OLMESARTAN MEDOXOMIL 40 MG PO TABS: 40.0000 mg | ORAL_TABLET | Freq: Every day | ORAL | 1 refills | Status: DC

## 2018-01-22 MED ORDER — AMOXICILLIN-POT CLAVULANATE 875-125 MG PO TABS: 1.0000 | ORAL_TABLET | Freq: Two times a day (BID) | ORAL | 0 refills | Status: AC

## 2018-01-22 MED ORDER — OLMESARTAN MEDOXOMIL 40 MG PO TABS: 40.0000 mg | ORAL_TABLET | Freq: Every day | ORAL | 3 refills | Status: DC

## 2018-01-22 MED ORDER — FLUCONAZOLE 150 MG PO TABS: 150.0000 mg | ORAL_TABLET | Freq: Once | ORAL | 0 refills | Status: AC

## 2018-01-22 NOTE — Assessment & Plan Note (Signed)
Switch from the valsartan to olmesartan; she'll monitor BP and let me know if not idea; she'll continue her great efforts at walking and weight loss; DASH guidelines reinforced

## 2018-01-22 NOTE — Assessment & Plan Note (Signed)
Okay to approve benzo prior to dental appointments if needed

## 2018-01-22 NOTE — Progress Notes (Signed)
BP 126/82   Pulse 94   Temp 98.4 F (36.9 C) (Oral)   Resp 14   Ht 5' 3.5" (1.613 m)   Wt 168 lb 4.8 oz (76.3 kg)   SpO2 97%   BMI 29.35 kg/m    Subjective:    Patient ID: Samantha Stephens, female    DOB: Jul 28, 1961, 57 y.o.   MRN: [id number]  HPI: KESHAWN SUNDBERG is a 57 y.o. female  Chief Complaint  Patient presents with  . Follow-up  . Hypertension    since changing bp med that was recalled  . Headache    on top of head she describes as pressure    HPI  She had gone to the pharmacy and asked if taking it It's been about a month Her BP was running high and she doubled the dose They took a reading here and it's "normal", 126/82 Checked yesterday here and it was normal; 132/84 yesterday per CMA After she came two weeks ago, she has the mediterranean and DASH diet; she is really trying to do that; has added 30 minutes of walking a day (weather permitting) No decongestants Weight loss noted 150/101 before we doubled ARB from 80 to 160 mg; she is feeling better  Headaches; right on top of the head; right side dome of the scalp; if BP goes up, it goes up and throbs right there; feels like a dehydrated headache; only on the right; not in the sinuses; may feel like caffeine and pressure headaches; sore to the touch, tender; when bothering her, the only way to find relief is to press on it a little; in her mind, she thinks she might have visual changes, can't read clearly from the RIGHT eye; worse when night and reading; always the same spot; no problems with speaking or swallowing; no problems with balance; no loss of strength or sensation in arm or leg; sometimes will wake up with dull headache, and that's only when not wearing CPAP; if she wear CPAP, no morning headaches; no morning nausea; hearing has diminished a little bit; could be my age she says; she can yawn and it'll pop and she can hear again; root canal and had abscess and took amox for 2 weeks; that was maybe a month  ago  Depression screen [facility] 2/9 01/22/2018 09/15/2017 08/29/2017 03/10/2017 12/04/2016  Decreased Interest 0 0 0 0 0  Down, Depressed, Hopeless 0 0 0 0 0  PHQ - 2 Score 0 0 0 0 0    Relevant past medical, surgical, family and social history reviewed Past Medical History:  Diagnosis Date  . Arthritis   . Chronic mid back pain 08/18/2015  . 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 Family hx of colon cancer requiring screening colonoscopy 03/08/2016  . Headache   . Hyperlipidemia   . Hypertension   . Hypokalemia   . Palpitations   . Scoliosis 03/08/2016  . Sleep apnea   . Stress headaches    Past Surgical History:  Procedure Laterality Date  . ABDOMINAL HYSTERECTOMY    . CESAREAN SECTION     1  . COLONOSCOPY    . CYSTOSCOPY W/ RETROGRADES Bilateral 11/25/2016   Procedure: CYSTOSCOPY WITH RETROGRADE PYELOGRAM;  Surgeon: Hollice Espy, MD;  Location: [facility] ORS;  Service: Urology;  Laterality: Bilateral;  . EYE SURGERY     lasix eye surgery   Family History  Problem Relation Age of Onset  . Arthritis Mother   . COPD Mother   .  Depression Mother   . Diabetes Mother   . Hyperlipidemia Mother   . Hypertension Mother   . Cataracts Mother   . Heart disease Mother   . Cancer Father        kidney and prostate cancer, colon cancer  . Hearing loss Father   . Kidney disease Father   . Cataracts Father   . Asthma Sister   . Cancer Paternal Grandmother   . Mental illness Sister   . Breast cancer Other    Social History   Tobacco Use  . Smoking status: Former Research scientist (life sciences)  . Smokeless tobacco: Never Used  . Tobacco comment: but only for a short amount of time  Substance Use Topics  . Alcohol use: Yes    Alcohol/week: 0.0 oz    Comment: occasional glass of wine  . Drug use: No    Interim medical history since last visit reviewed. Allergies and medications reviewed  Review of Systems Per HPI unless specifically indicated above     Objective:    BP 126/82   Pulse 94    Temp 98.4 F (36.9 C) (Oral)   Resp 14   Ht 5' 3.5" (1.613 m)   Wt 168 lb 4.8 oz (76.3 kg)   SpO2 97%   BMI 29.35 kg/m   Wt Readings from Last 3 Encounters:  01/26/18 168 lb (76.2 kg)  01/22/18 168 lb 4.8 oz (76.3 kg)  01/07/18 169 lb 1.6 oz (76.7 kg)    Physical Exam  Constitutional: She appears well-developed and well-nourished.  HENT:  Head: Normocephalic and atraumatic.    Right Ear: Tympanic membrane is not erythematous. No middle ear effusion.  Left Ear: Tympanic membrane is not erythematous.  No middle ear effusion.  Nose: No rhinorrhea.  Mouth/Throat: Mucous membranes are normal. No posterior oropharyngeal edema or posterior oropharyngeal erythema.  Mild tenderness over scalp, but no cords, nothing palpable over temporal arteries; no knot, no overlyign skin changes, no alopecia  Eyes: EOM are normal. No scleral icterus.  Cardiovascular: Normal rate and regular rhythm.  Pulmonary/Chest: Effort normal and breath sounds normal.  Neurological: She is alert. She displays no tremor. No cranial nerve deficit. She exhibits normal muscle tone. Gait normal.  UE strength 5/5  Psychiatric: She has a normal mood and affect. Her behavior is normal.       Assessment & Plan:   Problem List Items Addressed This Visit      Cardiovascular and Mediastinum   Hypertension goal BP (blood pressure) < 140/90    Switch from the valsartan to olmesartan; she'll monitor BP and let me know if not idea; she'll continue her great efforts at walking and weight loss; DASH guidelines reinforced        Other   Phobia of dental procedure    Okay to approve benzo prior to dental appointments if needed       Other Visit Diagnoses    Nonintractable episodic headache, unspecified headache type    -  Primary   Relevant Orders   Ambulatory referral to Neurology       Follow up plan: Return if symptoms worsen or fail to improve.  An after-visit summary was printed and given to the patient  at [facility].  Please see the patient instructions which may contain other information and recommendations beyond what is mentioned above in the assessment and plan.  Meds ordered this encounter  Medications  . DISCONTD: olmesartan (BENICAR) 40 MG tablet    Sig: Take 1 tablet (  40 mg total) by mouth daily.    Dispense:  30 tablet    Refill:  3  . DISCONTD: olmesartan (BENICAR) 40 MG tablet    Sig: Take 1 tablet (40 mg total) by mouth daily.    Dispense:  90 tablet    Refill:  1    She is filling her 1st Rx here locally; please fill and mail out in one week  . amoxicillin-clavulanate (AUGMENTIN) 875-125 MG tablet    Sig: Take 1 tablet by mouth 2 (two) times daily for 10 days.    Dispense:  20 tablet    Refill:  0  . predniSONE (DELTASONE) 20 MG tablet    Sig: Take 1 tablet (20 mg total) by mouth daily with breakfast. (or other meal)    Dispense:  5 tablet    Refill:  0  . fluconazole (DIFLUCAN) 150 MG tablet    Sig: Take 1 tablet (150 mg total) by mouth once for 1 dose. Do not take cholesterol medicine for 3 days    Dispense:  1 tablet    Refill:  0    Okay to leave on file right now    Orders Placed This Encounter  Procedures  . Ambulatory referral to Neurology   Face-to-face time with patient was more than 25 minutes, >50% time spent counseling and coordination of care

## 2018-01-22 NOTE — Patient Instructions (Addendum)
Continue the DASH guidelines Keep up your great efforts at weight loss and walking Stop the valsartan Start the olmesartan (Benicar) Check blood pressure and let me know if not ideal (ideal is just under 120/80, too high is 140/90 or over) Start the antibiotics Please do eat yogurt or kimchi or take a probiotic daily for the next month We want to replace the healthy germs in the gut If you notice foul, watery diarrhea in the next two months, schedule an appointment RIGHT AWAY or go to an urgent care or the emergency room if a holiday or over a weekend Start the prednisone If headache persists, we'll have you see the neurologist  DASH Eating Plan [place] stands for "Dietary Approaches to Stop Hypertension." The DASH eating plan is a healthy eating plan that has been shown to reduce high blood pressure (hypertension). It may also reduce your risk for type 2 diabetes, heart disease, and stroke. The DASH eating plan may also help with weight loss. What are tips for following this plan? General guidelines  Avoid eating more than 2,300 mg (milligrams) of salt (sodium) a day. If you have hypertension, you may need to reduce your sodium intake to 1,500 mg a day.  Limit alcohol intake to no more than 1 drink a day for nonpregnant women and 2 drinks a day for men. One drink equals 12 oz of beer, 5 oz of wine, or 1 oz of hard liquor.  Work with your health care provider to maintain a healthy body weight or to lose weight. Ask what an ideal weight is for you.  Get at least 30 minutes of exercise that causes your heart to beat faster (aerobic exercise) most days of the week. Activities may include walking, swimming, or biking.  Work with your health care provider or diet and nutrition specialist (dietitian) to adjust your eating plan to your individual calorie needs. Reading food labels  Check food labels for the amount of sodium per serving. Choose foods with less than 5 percent of the Daily Value of  sodium. Generally, foods with less than 300 mg of sodium per serving fit into this eating plan.  To find whole grains, look for the word "whole" as the first word in the ingredient list. Shopping  Buy products labeled as "low-sodium" or "no salt added."  Buy fresh foods. Avoid canned foods and premade or frozen meals. Cooking  Avoid adding salt when cooking. Use salt-free seasonings or herbs instead of table salt or sea salt. Check with your health care provider or pharmacist before using salt substitutes.  Do not fry foods. Cook foods using healthy methods such as baking, boiling, grilling, and broiling instead.  Cook with heart-healthy oils, such as olive, canola, soybean, or sunflower oil. Meal planning   Eat a balanced diet that includes: ? 5 or more servings of fruits and vegetables each day. At each meal, try to fill half of your plate with fruits and vegetables. ? Up to 6-8 servings of whole grains each day. ? Less than 6 oz of lean meat, poultry, or fish each day. A 3-oz serving of meat is about the same size as a deck of cards. One egg equals 1 oz. ? 2 servings of low-fat dairy each day. ? A serving of nuts, seeds, or beans 5 times each week. ? Heart-healthy fats. Healthy fats called Omega-3 fatty acids are found in foods such as flaxseeds and coldwater fish, like sardines, salmon, and mackerel.  Limit how much you eat  of the following: ? Canned or prepackaged foods. ? Food that is high in trans fat, such as fried foods. ? Food that is high in saturated fat, such as fatty meat. ? Sweets, desserts, sugary drinks, and other foods with added sugar. ? Full-fat dairy products.  Do not salt foods before eating.  Try to eat at least 2 vegetarian meals each week.  Eat more home-cooked food and less restaurant, buffet, and fast food.  When eating at a restaurant, ask that your food be prepared with less salt or no salt, if possible. What foods are recommended? The items  listed may not be a complete list. Talk with your dietitian about what dietary choices are best for you. Grains Whole-grain or whole-wheat bread. Whole-grain or whole-wheat pasta. Brown rice. [place]. Bulgur. Whole-grain and low-sodium cereals. Pita bread. Low-fat, low-sodium crackers. Whole-wheat flour tortillas. Vegetables Fresh or frozen vegetables (raw, steamed, roasted, or grilled). Low-sodium or reduced-sodium tomato and vegetable juice. Low-sodium or reduced-sodium tomato sauce and tomato paste. Low-sodium or reduced-sodium canned vegetables. Fruits All fresh, dried, or frozen fruit. Canned fruit in natural juice (without added sugar). Meat and other protein foods Skinless chicken or [place]. Ground chicken or [place]. Pork with fat trimmed off. Fish and seafood. Egg whites. Dried beans, peas, or lentils. Unsalted nuts, nut butters, and seeds. Unsalted canned beans. Lean cuts of beef with fat trimmed off. Low-sodium, lean deli meat. Dairy Low-fat (1%) or fat-free (skim) milk. Fat-free, low-fat, or reduced-fat cheeses. Nonfat, low-sodium ricotta or cottage cheese. Low-fat or nonfat yogurt. Low-fat, low-sodium cheese. Fats and oils Soft margarine without trans fats. Vegetable oil. Low-fat, reduced-fat, or light mayonnaise and salad dressings (reduced-sodium). Canola, safflower, olive, soybean, and sunflower oils. Avocado. Seasoning and other foods Herbs. Spices. Seasoning mixes without salt. Unsalted popcorn and pretzels. Fat-free sweets. What foods are not recommended? The items listed may not be a complete list. Talk with your dietitian about what dietary choices are best for you. Grains Baked goods made with fat, such as croissants, muffins, or some breads. Dry pasta or rice meal packs. Vegetables Creamed or fried vegetables. Vegetables in a cheese sauce. Regular canned vegetables (not low-sodium or reduced-sodium). Regular canned tomato sauce and paste (not low-sodium or  reduced-sodium). Regular tomato and vegetable juice (not low-sodium or reduced-sodium). Angie Fava. Olives. Fruits Canned fruit in a light or heavy syrup. Fried fruit. Fruit in cream or butter sauce. Meat and other protein foods Fatty cuts of meat. Ribs. Fried meat. Berniece Salines. Sausage. Bologna and other processed lunch meats. Salami. Fatback. Hotdogs. Bratwurst. Salted nuts and seeds. Canned beans with added salt. Canned or smoked fish. Whole eggs or egg yolks. Chicken or [place] with skin. Dairy Whole or 2% milk, cream, and half-and-half. Whole or full-fat cream cheese. Whole-fat or sweetened yogurt. Full-fat cheese. Nondairy creamers. Whipped toppings. Processed cheese and cheese spreads. Fats and oils Butter. Stick margarine. Lard. Shortening. Ghee. Bacon fat. Tropical oils, such as coconut, palm kernel, or palm oil. Seasoning and other foods Salted popcorn and pretzels. Onion salt, garlic salt, seasoned salt, table salt, and sea salt. Worcestershire sauce. Tartar sauce. Barbecue sauce. Teriyaki sauce. Soy sauce, including reduced-sodium. Steak sauce. Canned and packaged gravies. Fish sauce. Oyster sauce. Cocktail sauce. Horseradish that you find on the shelf. Ketchup. Mustard. Meat flavorings and tenderizers. Bouillon cubes. Hot sauce and Tabasco sauce. Premade or packaged marinades. Premade or packaged taco seasonings. Relishes. Regular salad dressings. Where to find more information:  National Heart, Lung, and Blood Institute: https://wilson-eaton.com/  American Heart  Association: www.heart.org Summary  The DASH eating plan is a healthy eating plan that has been shown to reduce high blood pressure (hypertension). It may also reduce your risk for type 2 diabetes, heart disease, and stroke.  With the DASH eating plan, you should limit salt (sodium) intake to 2,300 mg a day. If you have hypertension, you may need to reduce your sodium intake to 1,500 mg a day.  When on the DASH eating plan, aim to eat more  fresh fruits and vegetables, whole grains, lean proteins, low-fat dairy, and heart-healthy fats.  Work with your health care provider or diet and nutrition specialist (dietitian) to adjust your eating plan to your individual calorie needs. This information is not intended to replace advice given to you by your health care provider. Make sure you discuss any questions you have with your health care provider. Document Released: 09/26/2011 Document Revised: 09/30/2016 Document Reviewed: 09/30/2016 Elsevier Interactive Patient Education  2018 [employer].  Sinusitis, Adult Sinusitis is soreness and inflammation of your sinuses. Sinuses are hollow spaces in the bones around your face. They are located:  Around your eyes.  In the middle of your forehead.  Behind your nose.  In your cheekbones.  Your sinuses and nasal passages are lined with a stringy fluid (mucus). Mucus normally drains out of your sinuses. When your nasal tissues get inflamed or swollen, the mucus can get trapped or blocked so air cannot flow through your sinuses. This lets bacteria, viruses, and funguses grow, and that leads to infection. Follow these instructions at home: Medicines  Take, use, or apply over-the-counter and prescription medicines only as told by your doctor. These may include nasal sprays.  If you were prescribed an antibiotic medicine, take it as told by your doctor. Do not stop taking the antibiotic even if you start to feel better. Hydrate and Humidify  Drink enough water to keep your pee (urine) clear or pale yellow.  Use a cool mist humidifier to keep the humidity level in your home above 50%.  Breathe in steam for 10-15 minutes, 3-4 times a day or as told by your doctor. You can do this in the bathroom while a hot shower is running.  Try not to spend time in cool or dry air. Rest  Rest as much as possible.  Sleep with your head raised (elevated).  Make sure to get enough sleep each  night. General instructions  Put a warm, moist washcloth on your face 3-4 times a day or as told by your doctor. This will help with discomfort.  Wash your hands often with soap and water. If there is no soap and water, use hand sanitizer.  Do not smoke. Avoid being around people who are smoking (secondhand smoke).  Keep all follow-up visits as told by your doctor. This is important. Contact a doctor if:  You have a fever.  Your symptoms get worse.  Your symptoms do not get better within 10 days. Get help right away if:  You have a very bad headache.  You cannot stop throwing up (vomiting).  You have pain or swelling around your face or eyes.  You have trouble seeing.  You feel confused.  Your neck is stiff.  You have trouble breathing. This information is not intended to replace advice given to you by your health care provider. Make sure you discuss any questions you have with your health care provider. Document Released: 03/25/2008 Document Revised: 06/02/2016 Document Reviewed: 08/02/2015 Elsevier Interactive Patient Education  2018 [place].

## 2018-01-26 ENCOUNTER — Emergency Department: Payer: Managed Care, Other (non HMO)

## 2018-01-26 ENCOUNTER — Other Ambulatory Visit: Payer: Self-pay

## 2018-01-26 ENCOUNTER — Emergency Department
Admission: EM | Admit: 2018-01-26 | Discharge: 2018-01-26 | Disposition: A | Discharge status: Home / Self Care | Payer: Managed Care, Other (non HMO) | Attending: Emergency Medicine | Admitting: Emergency Medicine

## 2018-01-26 DIAGNOSIS — R0789 Other chest pain: Secondary | ICD-10-CM | POA: Insufficient documentation

## 2018-01-26 DIAGNOSIS — I10 Essential (primary) hypertension: Secondary | ICD-10-CM | POA: Insufficient documentation

## 2018-01-26 DIAGNOSIS — Z79899 Other long term (current) drug therapy: Secondary | ICD-10-CM | POA: Diagnosis not present

## 2018-01-26 DIAGNOSIS — Z7982 Long term (current) use of aspirin: Secondary | ICD-10-CM | POA: Diagnosis not present

## 2018-01-26 DIAGNOSIS — Z87891 Personal history of nicotine dependence: Secondary | ICD-10-CM | POA: Diagnosis not present

## 2018-01-26 LAB — CBC
HEMATOCRIT: 38.7 % (ref 35.0–47.0)
HEMOGLOBIN: 12.9 g/dL (ref 12.0–16.0)
MCH: 25.2 pg — ABNORMAL LOW (ref 26.0–34.0)
MCHC: 33.2 g/dL (ref 32.0–36.0)
MCV: 76 fL — ABNORMAL LOW (ref 80.0–100.0)
Platelets: 269 10*3/uL (ref 150–440)
RBC: 5.1 MIL/uL (ref 3.80–5.20)
RDW: 15.1 % — ABNORMAL HIGH (ref 11.5–14.5)
WBC: 9.5 10*3/uL (ref 3.6–11.0)

## 2018-01-26 LAB — BASIC METABOLIC PANEL
ANION GAP: 7 (ref 5–15)
BUN: 13 mg/dL (ref 6–20)
CALCIUM: 9.4 mg/dL (ref 8.9–10.3)
CO2: 29 mmol/L (ref 22–32)
Chloride: 104 mmol/L (ref 101–111)
Creatinine, Ser: 0.99 mg/dL (ref 0.44–1.00)
Glucose, Bld: 106 mg/dL — ABNORMAL HIGH (ref 65–99)
POTASSIUM: 3.7 mmol/L (ref 3.5–5.1)
SODIUM: 140 mmol/L (ref 135–145)

## 2018-01-26 LAB — TROPONIN I

## 2018-01-26 NOTE — ED Notes (Signed)
Pt signed esignature.   D/c inst to pt.

## 2018-01-26 NOTE — ED Provider Notes (Signed)
[facility] Emergency Department Provider Note ____________________________________________   First MD Initiated Contact with Patient 01/26/18 1951     (approximate)  I have reviewed the triage vital signs and the nursing notes.   HISTORY  Chief Complaint Chest Pain    HPI Samantha Stephens is a 57 y.o. female with past medical history as noted below who presents with chest discomfort, acute onset today around 10 AM, associated with left arm heaviness and discomfort, and with generalized malaise and mild weakness today.  Patient states that the discomfort is improving.  She denies associated shortness of breath, nausea or vomiting, fevers, or cough.  No leg swelling.  She reports that she recently was changed on her blood pressure medication a few times and has been on Benicar for the last several days.   Past Medical History:  Diagnosis Date  . Arthritis   . Chronic mid back pain 08/18/2015  . 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 Family hx of colon cancer requiring screening colonoscopy 03/08/2016  . Headache   . Hyperlipidemia   . Hypertension   . Hypokalemia   . Palpitations   . Scoliosis 03/08/2016  . Sleep apnea   . Stress headaches     Patient Active Problem List   Diagnosis Date Noted  . 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 Elevated hemoglobin A1c 09/29/2017  . OSA on CPAP 09/15/2017  . Hepatitis C antibody test positive 03/12/2017  . Preventative health care 03/10/2017  . Hematuria, gross 11/12/2016  . Medication monitoring encounter 09/09/2016  . Chronic hip pain, right 09/09/2016  . Numbness of right foot 09/09/2016  . Right lower quadrant abdominal pain 09/09/2016  . Scoliosis 03/08/2016  . 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 Family hx of colon cancer requiring screening colonoscopy 03/08/2016  . Situational anxiety 10/27/2015  . Hypertension goal BP (blood pressure) < 140/90 08/18/2015  .  Hyperlipidemia LDL goal <100 08/18/2015  . Insomnia, controlled 08/18/2015  . Chronic mid back pain 08/18/2015    Past Surgical History:  Procedure Laterality Date  . ABDOMINAL HYSTERECTOMY    . CESAREAN SECTION     1  . COLONOSCOPY    . CYSTOSCOPY W/ RETROGRADES Bilateral 11/25/2016   Procedure: CYSTOSCOPY WITH RETROGRADE PYELOGRAM;  Surgeon: Hollice Espy, MD;  Location: [facility] ORS;  Service: Urology;  Laterality: Bilateral;  . EYE SURGERY     lasix eye surgery    Prior to Admission medications   Medication Sig Start Date End Date Taking? Authorizing Provider  albuterol (PROVENTIL HFA;VENTOLIN HFA) 108 (90 Base) MCG/ACT inhaler Inhale 2 puffs into the lungs every 4 (four) hours as needed for wheezing or shortness of breath. Patient not taking: Reported on 01/07/2018 12/04/16   Arnetha Courser, MD  amoxicillin-clavulanate (AUGMENTIN) 875-125 MG tablet Take 1 tablet by mouth 2 (two) times daily for 10 days. 01/22/18 02/01/18  Arnetha Courser, MD  aspirin EC 81 MG tablet Take 1 tablet (81 mg total) by mouth daily. 09/09/16   Arnetha Courser, MD  Cholecalciferol (VITAMIN D3) 2000 units TABS Take 1 capsule by mouth daily.    [provider]  estradiol (ESTRACE VAGINAL) 0.1 MG/GM vaginal cream Place 1 Applicatorful vaginally 2 (two) times a week. 04/05/16   Arnetha Courser, MD  Melatonin 5 MG CAPS Take 5 mg by mouth daily.    [provider]  Multiple Vitamin (MULTIVITAMIN) tablet Take 1 tablet by mouth daily.    [provider]  olmesartan (BENICAR) 40 MG tablet Take 1 tablet (40 mg total) by mouth daily. 01/22/18   Arnetha Courser, MD  predniSONE (DELTASONE) 20 MG tablet Take 1 tablet (20 mg total) by mouth daily with breakfast. (or other meal) 01/22/18   Lada, Satira Anis, MD  simvastatin (ZOCOR) 40 MG tablet TAKE 1 TABLET BY MOUTH AT  BEDTIME 01/12/18   Lada, Satira Anis, MD  tobramycin-dexamethasone [facility]) ophthalmic solution  08/06/16   [provider]     Allergies Trazodone and nefazodone  Family History  Problem Relation Age of Onset  . Arthritis Mother   . COPD Mother   . Depression Mother   . Diabetes Mother   . Hyperlipidemia Mother   . Hypertension Mother   . Cataracts Mother   . Heart disease Mother   . Cancer Father        kidney and prostate cancer, colon cancer  . Hearing loss Father   . Kidney disease Father   . Cataracts Father   . Asthma Sister   . Cancer Paternal Grandmother   . Mental illness Sister   . Breast cancer Other     Social History Social History   Tobacco Use  . Smoking status: Former Research scientist (life sciences)  . Smokeless tobacco: Never Used  . Tobacco comment: but only for a short amount of time  Substance Use Topics  . Alcohol use: Yes    Alcohol/week: 0.0 oz    Comment: occasional glass of wine  . Drug use: No    Review of Systems  Constitutional: No fever. Eyes: No redness. ENT: No neck pain. Cardiovascular: Positive for chest discomfort. Respiratory: Denies shortness of breath. Gastrointestinal: No vomiting.  Genitourinary: Negative for flank pain.  Musculoskeletal: Negative for back pain. Skin: Negative for rash. Neurological: Negative for headache.   ____________________________________________   PHYSICAL EXAM:  VITAL SIGNS: ED Triage Vitals  Enc Vitals Group     BP 01/26/18 1808 (!) 173/101     Pulse Rate 01/26/18 1808 75     Resp 01/26/18 1808 15     Temp 01/26/18 1808 97.7 F (36.5 C)     Temp Source 01/26/18 1808 Oral     SpO2 01/26/18 1808 99 %     Weight 01/26/18 1809 168 lb (76.2 kg)     Height 01/26/18 1809 5' 3"  (1.6 m)     Head Circumference --      Peak Flow --      Pain Score 01/26/18 1809 0     Pain Loc --      Pain Edu? --      Excl. in [place]? --     Constitutional: Alert and oriented. Well appearing and in no acute distress. Eyes: Conjunctivae are normal.  Head: Atraumatic. Nose: No congestion/rhinnorhea. Mouth/Throat: Mucous membranes are moist.   Neck:  Normal range of motion.  Cardiovascular: Normal rate, regular rhythm. Grossly normal heart sounds.  Good peripheral circulation. Respiratory: Normal respiratory effort.  No retractions. Lungs CTAB. Gastrointestinal: No distention.  Musculoskeletal: No lower extremity edema.  Extremities warm and well perfused.  Left arm with full range of motion all joints, nontender. Neurologic:  Normal speech and language. No gross focal neurologic deficits are appreciated.  Skin:  Skin is warm and dry. No rash noted. Psychiatric: Mood and affect are normal. Speech and behavior are normal.  ____________________________________________   LABS (all labs ordered are listed, but only abnormal results are displayed)  Labs Reviewed  BASIC METABOLIC PANEL -  Abnormal; Notable for the following components:      Result Value   Glucose, Bld 106 (*)    All other components within normal limits  CBC - Abnormal; Notable for the following components:   MCV 76.0 (*)    MCH 25.2 (*)    RDW 15.1 (*)    All other components within normal limits  TROPONIN I   ____________________________________________  EKG  ED ECG REPORT I, Arta Silence, the attending physician, personally viewed and interpreted this ECG.  Date: 01/26/2018 EKG Time: 1802 Rate: 71 Rhythm: normal sinus rhythm QRS Axis: normal Intervals: normal ST/T Wave abnormalities: normal Narrative Interpretation: no evidence of acute ischemia  ____________________________________________  RADIOLOGY  CXR: No focal infiltrate or other abnormalities  ____________________________________________   PROCEDURES  Procedure(s) performed: No  Procedures  Critical Care performed: No ____________________________________________   INITIAL IMPRESSION / ASSESSMENT AND PLAN / ED COURSE  Pertinent labs & imaging results that were available during my care of the patient were reviewed by me and considered in my medical decision making (see chart  for details).  57 year old female with history of hypertension and other PMH as noted above presents with atypical chest and left arm discomfort starting at 10 AM today, and associated with some generalized malaise/weakness.  The symptoms have now almost completely resolved.  Patient was hypertensive when she arrived to the ED but this is resolved as well.  On exam, the patient is well-appearing, vital signs are normal, and the remainder the exam is unremarkable.  EKG is nonischemic.  Overall presentation is most consistent with musculoskeletal discomfort, given that the patient states that she was working out in the yard yesterday.  There is no clinical evidence for ACS.  Given the onset of pain at 10 AM and the normal EKG, the single troponin is sufficient to rule out ACS in this low risk patient.  There is no clinical evidence to suggest PE, aortic dissection, or other vascular cause.    The patient feels well and would like to go home.  Return precautions given, and she expresses understanding.      ____________________________________________   FINAL CLINICAL IMPRESSION(S) / ED DIAGNOSES  Final diagnoses:  Atypical chest pain  Hypertension, unspecified type      NEW MEDICATIONS STARTED DURING THIS VISIT:  New Prescriptions   No medications on file     Note:  This document was prepared using Dragon voice recognition software and may include unintentional dictation errors.    Arta Silence, MD 01/26/18 2012

## 2018-01-26 NOTE — ED Triage Notes (Signed)
Pt c/o left arm and chest pain today. Pt states she just started a new b/p med yesterday. Pt is in NAD on arrival. Pt is hypertensive.Marland Kitchen

## 2018-01-26 NOTE — Discharge Instructions (Addendum)
Continue to take your blood pressure medication as prescribed, and follow-up with your regular doctor.  Return to the ER for new, worsening, or persistent chest or arm pain, weakness or lightheadedness, difficulty breathing, very high blood pressure readings, or any other new or worsening symptoms that concern you.

## 2018-02-02 ENCOUNTER — Telehealth: Payer: Self-pay

## 2018-02-02 MED ORDER — METOPROLOL SUCCINATE ER 25 MG PO TB24: 25.0000 mg | ORAL_TABLET | Freq: Every day | ORAL | 0 refills | Status: DC

## 2018-02-02 MED ORDER — CHLORTHALIDONE 25 MG PO TABS: 25.0000 mg | ORAL_TABLET | Freq: Every day | ORAL | 0 refills | Status: DC

## 2018-02-02 NOTE — Telephone Encounter (Signed)
Pt called still having chest pressure and dry cough with new bp med.  Just went to the ER last week with same symptoms and everything checked out normal.  Wants to see if we can go ahead and change bp med since still having side effects.  It has already been updated in allergies as side effect.

## 2018-02-02 NOTE — Telephone Encounter (Signed)
Yes, STOP the benicar (olmesartan) I see how high her pressure was in the ER Start new BP medicines Return in 48 hours for BP and pulse check and again in 7 days for BP and pulse check

## 2018-02-02 NOTE — Telephone Encounter (Signed)
Left detailed voicemial

## 2018-02-06 ENCOUNTER — Ambulatory Visit: Payer: Managed Care, Other (non HMO)

## 2018-02-06 VITALS — BP 124/82 | HR 78

## 2018-02-06 DIAGNOSIS — I10 Essential (primary) hypertension: Secondary | ICD-10-CM

## 2018-02-19 ENCOUNTER — Other Ambulatory Visit: Payer: Self-pay | Admitting: Family Medicine

## 2018-03-02 ENCOUNTER — Ambulatory Visit: Payer: Managed Care, Other (non HMO) | Admitting: Family Medicine

## 2018-03-02 ENCOUNTER — Encounter: Payer: Self-pay | Admitting: Family Medicine

## 2018-03-02 VITALS — BP 124/68 | HR 97 | Temp 98.8°F | Resp 14 | Ht 64.0 in | Wt 164.0 lb

## 2018-03-02 DIAGNOSIS — S46819D Strain of other muscles, fascia and tendons at shoulder and upper arm level, unspecified arm, subsequent encounter: Secondary | ICD-10-CM | POA: Diagnosis not present

## 2018-03-02 DIAGNOSIS — F4321 Adjustment disorder with depressed mood: Secondary | ICD-10-CM | POA: Diagnosis not present

## 2018-03-02 DIAGNOSIS — I10 Essential (primary) hypertension: Secondary | ICD-10-CM

## 2018-03-02 MED ORDER — MELOXICAM 7.5 MG PO TABS: 7.5000 mg | ORAL_TABLET | Freq: Every day | ORAL | 0 refills | Status: DC

## 2018-03-02 MED ORDER — LORAZEPAM 0.5 MG PO TABS: 0.2500 mg | ORAL_TABLET | Freq: Four times a day (QID) | ORAL | 0 refills | Status: DC | PRN

## 2018-03-02 MED ORDER — METOPROLOL SUCCINATE ER 25 MG PO TB24
25.0000 mg | ORAL_TABLET | Freq: Every day | ORAL | 0 refills | Status: DC
Start: 2018-03-02 — End: 2018-03-06

## 2018-03-02 MED ORDER — CHLORTHALIDONE 25 MG PO TABS: 25.0000 mg | ORAL_TABLET | Freq: Every day | ORAL | 0 refills | Status: DC

## 2018-03-02 MED ORDER — ESCITALOPRAM OXALATE 10 MG PO TABS: 10.0000 mg | ORAL_TABLET | Freq: Every day | ORAL | 0 refills | Status: DC

## 2018-03-02 NOTE — Assessment & Plan Note (Signed)
Refilled medicines

## 2018-03-02 NOTE — Progress Notes (Signed)
BP 124/68   Pulse 97   Temp 98.8 F (37.1 C) (Oral)   Resp 14   Ht 5' 4"  (1.626 m)   Wt 164 lb (74.4 kg)   SpO2 95%   BMI 28.15 kg/m    Subjective:    Patient ID: Samantha Stephens, female    DOB: 1960/12/29, 57 y.o.   MRN: [id number]  HPI: Samantha Stephens is a 57 y.o. female  Chief Complaint  Patient presents with  . Depression    husband just passed last week    HPI  Patient is here for an acute visit Her husband just passed away suddenly on 18-Jan-2023 (today is Monday); wake is tomorrow evening; funeral is Wednesday She thinks she will need something to calm her down When she had Lasik surgery, they gave her something like Valium; did not snow her; she is not a pill taker, but would feel comfortable having something to have just in case it was needed; they are expecting a lot of people at the wake  She has had ongoing symptoms on the right side of her head; neck issues; she did CPR on her own husband Neighbors are chiropractors; she went over to her house and they did acupuncture and that relieved it She had to swing her husband to the floor to do CPR; thought she hurt her back Could be stress right now too BP shot up the other night; she laid down but jaw was clenching so hard, tense over muscle; chiro said it was muscle and tension; cold packs Not any muscle relaxers right now; using biofreeze and that really helps  Depression screen [facility] 2/9 01/22/2018 09/15/2017 08/29/2017 03/10/2017 12/04/2016  Decreased Interest 0 0 0 0 0  Down, Depressed, Hopeless 0 0 0 0 0  PHQ - 2 Score 0 0 0 0 0    Relevant past medical, surgical, family and social history reviewed Past Medical History:  Diagnosis Date  . Arthritis   . Chronic mid back pain 08/18/2015  . 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 Family hx of colon cancer requiring screening colonoscopy 03/08/2016  . Headache   . Hyperlipidemia   . Hypertension   . Hypokalemia   . Palpitations   . Scoliosis  03/08/2016  . Sleep apnea   . Stress headaches    Past Surgical History:  Procedure Laterality Date  . ABDOMINAL HYSTERECTOMY    . CESAREAN SECTION     1  . COLONOSCOPY    . CYSTOSCOPY W/ RETROGRADES Bilateral 11/25/2016   Procedure: CYSTOSCOPY WITH RETROGRADE PYELOGRAM;  Surgeon: Hollice Espy, MD;  Location: [facility] ORS;  Service: Urology;  Laterality: Bilateral;  . EYE SURGERY     lasix eye surgery   Family History  Problem Relation Age of Onset  . Arthritis Mother   . COPD Mother   . Depression Mother   . Diabetes Mother   . Hyperlipidemia Mother   . Hypertension Mother   . Cataracts Mother   . Heart disease Mother   . Cancer Father        kidney and prostate cancer, colon cancer  . Hearing loss Father   . Kidney disease Father   . Cataracts Father   . Asthma Sister   . Cancer Paternal Grandmother   . Mental illness Sister   . Breast cancer Other    Social History   Tobacco Use  . Smoking status: Former Research scientist (life sciences)  . Smokeless tobacco: Never Used  . Tobacco  comment: but only for a short amount of time  Substance Use Topics  . Alcohol use: Yes    Alcohol/week: 0.0 oz    Comment: occasional glass of wine  . Drug use: No    Interim medical history since last visit reviewed. Allergies and medications reviewed  Review of Systems Per HPI unless specifically indicated above     Objective:    BP 124/68   Pulse 97   Temp 98.8 F (37.1 C) (Oral)   Resp 14   Ht 5' 4"  (1.626 m)   Wt 164 lb (74.4 kg)   SpO2 95%   BMI 28.15 kg/m   Wt Readings from Last 3 Encounters:  03/02/18 164 lb (74.4 kg)  01/26/18 168 lb (76.2 kg)  01/22/18 168 lb 4.8 oz (76.3 kg)    Physical Exam  Constitutional: She appears well-developed and well-nourished. No distress.  HENT:  Head: Normocephalic and atraumatic.  Eyes: EOM are normal. No scleral icterus.  Neck: No thyromegaly present.  Cardiovascular: Normal rate, regular rhythm and normal heart sounds.  No murmur  heard. Pulmonary/Chest: Effort normal and breath sounds normal. No respiratory distress. She has no wheezes.  Abdominal: Soft. Bowel sounds are normal. She exhibits no distension.  Musculoskeletal: Normal range of motion. She exhibits no edema.       Cervical back: She exhibits tenderness.       Back:  Traps are tight  Neurological: She is alert. She exhibits normal muscle tone.  UE strength 5/5  Skin: Skin is warm and dry. She is not diaphoretic. No pallor.  Psychiatric: Her behavior is normal. Judgment and thought content normal. Her mood appears not anxious. Her affect is not blunt, not labile and not inappropriate. She exhibits a depressed mood.  Good eye contact with examiner; appropriate for circumstances      Assessment & Plan:   Problem List Items Addressed This Visit      Cardiovascular and Mediastinum   Hypertension goal BP (blood pressure) < 140/90    Refilled medicines      Relevant Medications   chlorthalidone (HYGROTON) 25 MG tablet   metoprolol succinate (TOPROL-XL) 25 MG 24 hr tablet    Other Visit Diagnoses    Grief reaction    -  Primary   support offered; Hospice grief counseling recommended, list of counselors given too; start SSRI; benzo to use only if needed   Strain of trapezius muscle, unspecified laterality, subsequent encounter       anti-inflammatory short-term; recommended massage or chiropractic; may continue topical biofreeze       Follow up plan: No follow-ups on file.  An after-visit summary was printed and given to the patient at [facility].  Please see the patient instructions which may contain other information and recommendations beyond what is mentioned above in the assessment and plan.  Meds ordered this encounter  Medications  . LORazepam (ATIVAN) 0.5 MG tablet    Sig: Take 0.5-1 tablets (0.25-0.5 mg total) by mouth every 6 (six) hours as needed for anxiety.    Dispense:  10 tablet    Refill:  0  . escitalopram (LEXAPRO) 10 MG  tablet    Sig: Take 1 tablet (10 mg total) by mouth daily.    Dispense:  30 tablet    Refill:  0  . meloxicam (MOBIC) 7.5 MG tablet    Sig: Take 1 tablet (7.5 mg total) by mouth daily. If needed    Dispense:  30 tablet    Refill:  0  . chlorthalidone (HYGROTON) 25 MG tablet    Sig: Take 1 tablet (25 mg total) by mouth daily.    Dispense:  30 tablet    Refill:  0  . metoprolol succinate (TOPROL-XL) 25 MG 24 hr tablet    Sig: Take 1 tablet (25 mg total) by mouth daily.    Dispense:  30 tablet    Refill:  0    No orders of the defined types were placed in this encounter.

## 2018-03-02 NOTE — Patient Instructions (Addendum)
The professional, licensed staff of the [facility] [place] [facility] can provide one-on-one counseling to you or your family members. To schedule an appointment for yourself, please call our [place] at [phone]. Counseling appointments and support groups meet in [facility]'s main building at [place].    Do try to hydrate Use the new medicine if needed Start the escitalopram (Lexapro) and take that every day -- we'll plan to continue for a few months Start the anti-inflammatory Okay to use tylenol per package directions

## 2018-03-06 ENCOUNTER — Other Ambulatory Visit: Payer: Self-pay | Admitting: Family Medicine

## 2018-03-06 ENCOUNTER — Other Ambulatory Visit: Payer: Self-pay

## 2018-03-06 MED ORDER — ESCITALOPRAM OXALATE 10 MG PO TABS: 10.0000 mg | ORAL_TABLET | Freq: Every day | ORAL | 0 refills | Status: DC

## 2018-03-06 MED ORDER — CHLORTHALIDONE 25 MG PO TABS: 25.0000 mg | ORAL_TABLET | Freq: Every day | ORAL | 0 refills | Status: DC

## 2018-03-06 MED ORDER — METOPROLOL SUCCINATE ER 25 MG PO TB24: 25.0000 mg | ORAL_TABLET | Freq: Every day | ORAL | 0 refills | Status: DC

## 2018-03-06 MED ORDER — MELOXICAM 7.5 MG PO TABS
7.5000 mg | ORAL_TABLET | Freq: Every day | ORAL | 0 refills | Status: DC
Start: 2018-03-06 — End: 2018-04-13

## 2018-03-06 NOTE — Telephone Encounter (Signed)
Pt was told needed to use new pharmacy due to ins., [facility] preferred

## 2018-03-09 ENCOUNTER — Telehealth: Payer: Self-pay | Admitting: Family Medicine

## 2018-03-09 NOTE — Telephone Encounter (Signed)
Copied from [place] [id number]. Topic: Quick Communication - Rx Refill/Question >> Mar 09, 2018  4:39 PM Arletha Grippe wrote: Medication: metoprolol succinate (TOPROL-XL) 25 MG 24 hr tablet  Has the patient contacted their pharmacy? Yes.   (Agent: If no, request that the patient contact the pharmacy for the refill.) (Agent: If yes, when and what did the pharmacy advise?) NEEDS CHANGED TO 62 DAY  Preferred Pharmacy (with phone number or street name): [facility] [place] [phone]   Agent: Please be advised that RX refills may take up to 3 business days. We ask that you follow-up with your pharmacy.

## 2018-03-10 MED ORDER — METOPROLOL SUCCINATE ER 25 MG PO TB24: 25.0000 mg | ORAL_TABLET | Freq: Every day | ORAL | 1 refills | Status: DC

## 2018-03-17 ENCOUNTER — Ambulatory Visit: Payer: 59 | Admitting: Family Medicine

## 2018-03-18 ENCOUNTER — Other Ambulatory Visit: Payer: Self-pay

## 2018-03-18 MED ORDER — CHLORTHALIDONE 25 MG PO TABS: 25.0000 mg | ORAL_TABLET | Freq: Every day | ORAL | 0 refills | Status: DC

## 2018-03-21 ENCOUNTER — Other Ambulatory Visit: Payer: Self-pay | Admitting: Family Medicine

## 2018-03-30 ENCOUNTER — Other Ambulatory Visit: Payer: Self-pay | Admitting: Family Medicine

## 2018-04-03 ENCOUNTER — Other Ambulatory Visit: Payer: Self-pay

## 2018-04-06 ENCOUNTER — Ambulatory Visit: Payer: Managed Care, Other (non HMO) | Admitting: Family Medicine

## 2018-04-06 MED ORDER — ESCITALOPRAM OXALATE 10 MG PO TABS: 10.0000 mg | ORAL_TABLET | Freq: Every day | ORAL | 2 refills | Status: DC

## 2018-04-13 ENCOUNTER — Ambulatory Visit: Payer: Managed Care, Other (non HMO) | Admitting: Family Medicine

## 2018-04-13 ENCOUNTER — Encounter: Payer: Self-pay | Admitting: Family Medicine

## 2018-04-13 VITALS — BP 112/82 | HR 78 | Temp 98.6°F | Resp 12 | Ht 64.0 in | Wt 156.5 lb

## 2018-04-13 DIAGNOSIS — R7309 Other abnormal glucose: Secondary | ICD-10-CM | POA: Diagnosis not present

## 2018-04-13 DIAGNOSIS — E785 Hyperlipidemia, unspecified: Secondary | ICD-10-CM | POA: Diagnosis not present

## 2018-04-13 DIAGNOSIS — I10 Essential (primary) hypertension: Secondary | ICD-10-CM

## 2018-04-13 DIAGNOSIS — F4321 Adjustment disorder with depressed mood: Secondary | ICD-10-CM | POA: Diagnosis not present

## 2018-04-13 DIAGNOSIS — R634 Abnormal weight loss: Secondary | ICD-10-CM | POA: Diagnosis not present

## 2018-04-13 DIAGNOSIS — Z5181 Encounter for therapeutic drug level monitoring: Secondary | ICD-10-CM | POA: Diagnosis not present

## 2018-04-13 DIAGNOSIS — F4320 Adjustment disorder, unspecified: Secondary | ICD-10-CM

## 2018-04-13 MED ORDER — ESCITALOPRAM OXALATE 10 MG PO TABS: 10.0000 mg | ORAL_TABLET | Freq: Every day | ORAL | Status: DC

## 2018-04-13 NOTE — Assessment & Plan Note (Signed)
One reading; will check today and see

## 2018-04-13 NOTE — Assessment & Plan Note (Signed)
Well controlled 

## 2018-04-13 NOTE — Assessment & Plan Note (Signed)
Check lipids today 

## 2018-04-13 NOTE — Patient Instructions (Signed)
Please do take the escitalopram (Lexapro) at night every night

## 2018-04-13 NOTE — Progress Notes (Signed)
BP 112/82   Pulse 78   Temp 98.6 F (37 C) (Oral)   Resp 12   Ht 5' 4"  (1.626 m)   Wt 156 lb 8 oz (71 kg)   SpO2 96%   BMI 26.86 kg/m    Subjective:    Patient ID: Samantha Stephens, female    DOB: July 25, 1961, 57 y.o.   MRN: [id number]  HPI: Samantha Stephens is a 57 y.o. female  Chief Complaint  Patient presents with  . Follow-up    HPI Patient is here for f/u; her husband died unexpectedly a few months ago She is getting used to the new normal of not having him around; little things will remind her; cites examples Falling asleep well but waking up around 4 am She is using lexapro just PRN She has only taken a few doses of the lorazepam; the least amount of meds she can take, the better Not a pill taker by heart  She had an elevated A1c; only one A1c was elevated and she is watching that; no  No blurred vision Mother had diabetes in her older years, never on insulin  Depression screen [facility] 2/9 04/13/2018 01/22/2018 09/15/2017 08/29/2017 03/10/2017  Decreased Interest 1 0 0 0 0  Down, Depressed, Hopeless 1 0 0 0 0  PHQ - 2 Score 2 0 0 0 0  Altered sleeping 3 - - - -  Tired, decreased energy 1 - - - -  Change in appetite 3 - - - -  Feeling bad or failure about yourself  0 - - - -  Trouble concentrating 2 - - - -  Moving slowly or fidgety/restless 0 - - - -  Suicidal thoughts 0 - - - -  PHQ-9 Score 11 - - - -  Difficult doing work/chores Somewhat difficult - - - -    Relevant past medical, surgical, family and social history reviewed Past Medical History:  Diagnosis Date  . Arthritis   . Chronic mid back pain 08/18/2015  . 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 Family hx of colon cancer requiring screening colonoscopy 03/08/2016  . Headache   . Hyperlipidemia   . Hypertension   . Hypokalemia   . Palpitations   . Scoliosis 03/08/2016  . Sleep apnea   . Stress headaches    Past Surgical History:  Procedure Laterality Date  . ABDOMINAL  HYSTERECTOMY    . CESAREAN SECTION     1  . COLONOSCOPY    . CYSTOSCOPY W/ RETROGRADES Bilateral 11/25/2016   Procedure: CYSTOSCOPY WITH RETROGRADE PYELOGRAM;  Surgeon: Hollice Espy, MD;  Location: [facility] ORS;  Service: Urology;  Laterality: Bilateral;  . EYE SURGERY     lasix eye surgery   Family History  Problem Relation Age of Onset  . Arthritis Mother   . COPD Mother   . Depression Mother   . Diabetes Mother   . Hyperlipidemia Mother   . Hypertension Mother   . Cataracts Mother   . Heart disease Mother   . Cancer Father        kidney and prostate cancer, colon cancer  . Hearing loss Father   . Kidney disease Father   . Cataracts Father   . Asthma Sister   . Cancer Paternal Grandmother   . Mental illness Sister   . Breast cancer Other    Social History   Tobacco Use  . Smoking status: Former Research scientist (life sciences)  . Smokeless tobacco: Never Used  . Tobacco  comment: but only for a short amount of time  Substance Use Topics  . Alcohol use: Yes    Alcohol/week: 0.0 oz    Comment: occasional glass of wine  . Drug use: No    Interim medical history since last visit reviewed. Allergies and medications reviewed  Review of Systems Per HPI unless specifically indicated above     Objective:    BP 112/82   Pulse 78   Temp 98.6 F (37 C) (Oral)   Resp 12   Ht 5' 4"  (1.626 m)   Wt 156 lb 8 oz (71 kg)   SpO2 96%   BMI 26.86 kg/m    Physical Exam  Constitutional: She appears well-developed and well-nourished.  HENT:  Mouth/Throat: Mucous membranes are normal.  Eyes: EOM are normal. No scleral icterus.  Cardiovascular: Normal rate and regular rhythm.  Pulmonary/Chest: Effort normal and breath sounds normal.  Psychiatric: She has a normal mood and affect. Her behavior is normal.      Assessment & Plan:   Problem List Items Addressed This Visit      Cardiovascular and Mediastinum   Hypertension goal BP (blood pressure) < 140/90    Well-controlled      Relevant Orders     Comprehensive Metabolic Panel (CMET)     Other   Medication monitoring encounter   Relevant Orders   COMPLETE METABOLIC PANEL WITH GFR   TSH   Comprehensive Metabolic Panel (CMET)   Hyperlipidemia LDL goal <100    Check lipids today      Relevant Orders   Lipid panel   Elevated hemoglobin A1c    One reading; will check today and see      Relevant Orders   Hemoglobin A1C    Other Visit Diagnoses    Grief reaction    -  Primary   patient progressing as expected; supportive listening provided; encouraged daily use of SSRI, explained that it does not work if used PRN   Weight loss       likely related to her grief, death of husband; will check TSH to make sure not hyperthyroid; check glucose to r/o diabetes   Relevant Orders   TSH   Comprehensive Metabolic Panel (CMET)       Follow up plan: Return in about 6 weeks (around 05/25/2018) for follow-up visit with Dr. Sanda Klein.  An after-visit summary was printed and given to the patient at [place].  Please see the patient instructions which may contain other information and recommendations beyond what is mentioned above in the assessment and plan.  Meds ordered this encounter  Medications  . escitalopram (LEXAPRO) 10 MG tablet    Sig: Take 1 tablet (10 mg total) by mouth at bedtime.    Orders Placed This Encounter  Procedures  . COMPLETE METABOLIC PANEL WITH GFR  . Hemoglobin A1C  . Lipid panel  . TSH  . Comprehensive Metabolic Panel (CMET)

## 2018-04-15 ENCOUNTER — Emergency Department: Payer: Managed Care, Other (non HMO)

## 2018-04-15 ENCOUNTER — Encounter: Payer: Self-pay | Admitting: Emergency Medicine

## 2018-04-15 ENCOUNTER — Telehealth: Payer: Self-pay | Admitting: Family Medicine

## 2018-04-15 ENCOUNTER — Emergency Department
Admission: EM | Admit: 2018-04-15 | Discharge: 2018-04-15 | Disposition: A | Discharge status: Home / Self Care | Payer: Managed Care, Other (non HMO) | Attending: Emergency Medicine | Admitting: Emergency Medicine

## 2018-04-15 DIAGNOSIS — K219 Gastro-esophageal reflux disease without esophagitis: Secondary | ICD-10-CM | POA: Diagnosis not present

## 2018-04-15 DIAGNOSIS — Z87891 Personal history of nicotine dependence: Secondary | ICD-10-CM | POA: Diagnosis not present

## 2018-04-15 DIAGNOSIS — R079 Chest pain, unspecified: Secondary | ICD-10-CM | POA: Diagnosis present

## 2018-04-15 DIAGNOSIS — I10 Essential (primary) hypertension: Secondary | ICD-10-CM | POA: Insufficient documentation

## 2018-04-15 LAB — BASIC METABOLIC PANEL
ANION GAP: 12 (ref 5–15)
BUN: 10 mg/dL (ref 6–20)
CO2: 25 mmol/L (ref 22–32)
Calcium: 9.1 mg/dL (ref 8.9–10.3)
Chloride: 99 mmol/L (ref 98–111)
Creatinine, Ser: 0.88 mg/dL (ref 0.44–1.00)
GFR calc Af Amer: >60 mL/min (ref >60)
GFR calc non Af Amer: >60 mL/min (ref >60)
Glucose, Bld: 165 mg/dL — ABNORMAL HIGH (ref 70–99)
POTASSIUM: 3.1 mmol/L — AB (ref 3.5–5.1)
SODIUM: 136 mmol/L (ref 135–145)

## 2018-04-15 LAB — CBC
HEMATOCRIT: 39.7 % (ref 35.0–47.0)
HEMOGLOBIN: 13.6 g/dL (ref 12.0–16.0)
MCH: 26.1 pg (ref 26.0–34.0)
MCHC: 34.3 g/dL (ref 32.0–36.0)
MCV: 75.9 fL — ABNORMAL LOW (ref 80.0–100.0)
Platelets: 260 10*3/uL (ref 150–440)
RBC: 5.23 MIL/uL — ABNORMAL HIGH (ref 3.80–5.20)
RDW: 14.3 % (ref 11.5–14.5)
WBC: 7.2 10*3/uL (ref 3.6–11.0)

## 2018-04-15 LAB — TROPONIN I: Troponin I: <0.03 ng/mL (ref <0.03)

## 2018-04-15 MED ORDER — POTASSIUM CHLORIDE ER 10 MEQ PO TBCR: EXTENDED_RELEASE_TABLET | ORAL | 0 refills | Status: DC

## 2018-04-15 MED ORDER — FAMOTIDINE 20 MG PO TABS: 20.0000 mg | ORAL_TABLET | Freq: Two times a day (BID) | ORAL | 1 refills | Status: DC

## 2018-04-15 NOTE — ED Triage Notes (Signed)
Pt arrived with complaints of chest pain/discomfort. Pt reports the pain has been intermittent for the last week. Pt reports a lot of stress over the last month but wanted to be evaluated "to be safe."

## 2018-04-15 NOTE — ED Provider Notes (Signed)
[facility] Emergency Department Provider Note       Time seen: ----------------------------------------- 1:37 PM on 04/15/2018 -----------------------------------------   I have reviewed the triage vital signs and the nursing notes.  HISTORY   Chief Complaint Chest Pain    HPI Samantha Stephens is a 57 y.o. female with a history of hyperlipidemia, hypertension, hypokalemia and chronic back pain who presents to the ED for epigastric discomfort and feeling like she needs to belch.  Patient states the pains been intermittent for the past week.  She has been under significant stress over the last month and wanted to be evaluated to be safe.  Her husband died of a heart attack 1 month ago.  She denies fevers, chills or other complaints.  Past Medical History:  Diagnosis Date  . Arthritis   . Chronic mid back pain 08/18/2015  . 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 Family hx of colon cancer requiring screening colonoscopy 03/08/2016  . Headache   . Hyperlipidemia   . Hypertension   . Hypokalemia   . Palpitations   . Scoliosis 03/08/2016  . Sleep apnea   . Stress headaches     Patient Active Problem List   Diagnosis Date Noted  . 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 Elevated hemoglobin A1c 09/29/2017  . OSA on CPAP 09/15/2017  . Hepatitis C antibody test positive 03/12/2017  . Preventative health care 03/10/2017  . Hematuria, gross 11/12/2016  . Medication monitoring encounter 09/09/2016  . Chronic hip pain, right 09/09/2016  . Numbness of right foot 09/09/2016  . Scoliosis 03/08/2016  . 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 Family hx of colon cancer requiring screening colonoscopy 03/08/2016  . Situational anxiety 10/27/2015  . Hypertension goal BP (blood pressure) < 140/90 08/18/2015  . Hyperlipidemia LDL goal <100 08/18/2015  . Insomnia, controlled 08/18/2015  . Chronic mid back pain 08/18/2015    Past  Surgical History:  Procedure Laterality Date  . ABDOMINAL HYSTERECTOMY    . CESAREAN SECTION     1  . COLONOSCOPY    . CYSTOSCOPY W/ RETROGRADES Bilateral 11/25/2016   Procedure: CYSTOSCOPY WITH RETROGRADE PYELOGRAM;  Surgeon: Hollice Espy, MD;  Location: [facility] ORS;  Service: Urology;  Laterality: Bilateral;  . EYE SURGERY     lasix eye surgery    Allergies Trazodone and nefazodone and Benicar [olmesartan]  Social History Social History   Tobacco Use  . Smoking status: Former Research scientist (life sciences)  . Smokeless tobacco: Never Used  . Tobacco comment: but only for a short amount of time  Substance Use Topics  . Alcohol use: Yes    Alcohol/week: 0.0 oz    Comment: occasional glass of wine  . Drug use: No   Review of Systems Constitutional: Negative for fever. Cardiovascular: Negative for chest pain. Respiratory: Negative for shortness of breath. Gastrointestinal: Positive for abdominal pain, belching Musculoskeletal: Negative for back pain. Skin: Negative for rash. Neurological: Negative for headaches, focal weakness or numbness.  All systems negative/normal/unremarkable except as stated in the HPI  ____________________________________________   PHYSICAL EXAM:  VITAL SIGNS: ED Triage Vitals  Enc Vitals Group     BP 04/15/18 1153 (!) 146/90     Pulse Rate 04/15/18 1153 79     Resp 04/15/18 1153 16     Temp 04/15/18 1153 98.1 F (36.7 C)     Temp Source 04/15/18 1153 Oral     SpO2 04/15/18 1153 99 %     Weight 04/15/18  1154 156 lb (70.8 kg)     Height 04/15/18 1154 5' 4"  (1.626 m)     Head Circumference --      Peak Flow --      Pain Score 04/15/18 1154 6     Pain Loc --      Pain Edu? --      Excl. in [place]? --    Constitutional: Alert and oriented. Well appearing and in no distress. Eyes: Conjunctivae are normal. Normal extraocular movements. ENT   Head: Normocephalic and atraumatic.   Nose: No congestion/rhinnorhea.   Mouth/Throat: Mucous membranes are  moist.   Neck: No stridor. Cardiovascular: Normal rate, regular rhythm. No murmurs, rubs, or gallops. Respiratory: Normal respiratory effort without tachypnea nor retractions. Breath sounds are clear and equal bilaterally. No wheezes/rales/rhonchi. Gastrointestinal: Soft and nontender. Normal bowel sounds Musculoskeletal: Nontender with normal range of motion in extremities. No lower extremity tenderness nor edema. Neurologic:  Normal speech and language. No gross focal neurologic deficits are appreciated.  Skin:  Skin is warm, dry and intact. No rash noted. Psychiatric: Mood and affect are normal. Speech and behavior are normal.  ____________________________________________  EKG: Interpreted by me.  Sinus rhythm rate of 79 bpm, normal PR interval, normal QRS, normal QT.  ____________________________________________  ED COURSE:  As part of my medical decision making, I reviewed the following data within the [facility] History obtained from family if available, nursing notes, old chart and ekg, as well as notes from prior ED visits. Patient presented for symptoms of GI upset and belching, we will assess with labs and imaging as indicated at this time.   Procedures ____________________________________________   LABS (pertinent positives/negatives)  Labs Reviewed  BASIC METABOLIC PANEL - Abnormal; Notable for the following components:      Result Value   Potassium 3.1 (*)    Glucose, Bld 165 (*)    All other components within normal limits  CBC - Abnormal; Notable for the following components:   RBC 5.23 (*)    MCV 75.9 (*)    All other components within normal limits  TROPONIN I    RADIOLOGY  Chest x-ray is unremarkable  ____________________________________________  DIFFERENTIAL DIAGNOSIS   GERD, peptic ulcer disease, gas pain, anxiety, unstable angina unlikely  FINAL ASSESSMENT AND PLAN  GERD   Plan: The patient had presented for epigastric  discomfort and belching. Patient's labs are normal. Patient's imaging is also normal.  She will be encouraged to take an antacid and follow-up with her doctor for recheck.  She is low risk for ACS.   Laurence Aly, MD   Note: This note was generated in part or whole with voice recognition software. Voice recognition is usually quite accurate but there are transcription errors that can and very often do occur. I apologize for any typographical errors that were not detected and corrected.     Earleen Newport, MD 04/15/18 1339

## 2018-04-15 NOTE — Telephone Encounter (Signed)
Thank you :)

## 2018-04-15 NOTE — ED Notes (Signed)
First Nurse Note: Pt states epigastric pain, husband died of heart attack last month so she feels she might just be anxious. Appears in no distress.

## 2018-04-15 NOTE — Telephone Encounter (Signed)
Patient will play it by ear about referral. If she needs one, she will let you know. She will pick up RX.

## 2018-04-15 NOTE — Telephone Encounter (Signed)
Please contact the patient I see that she was in the ER for chest pain Her potassium was low; did they give her potassium in the ER or send her home with a prescription? If not, I need her to start some Rx potassium Also, offer a referral to a cardiologist and ENTER REFERRAL for chest pain if she wants to see one; we understand the stress she has been under and I'm sure that's on her mind Thank you

## 2018-04-16 LAB — COMPREHENSIVE METABOLIC PANEL
A/G RATIO: 1.8 (ref 1.2–2.2)
ALT: 27 IU/L (ref 0–32)
AST: 27 IU/L (ref 0–40)
Albumin: 4.8 g/dL (ref 3.5–5.5)
Alkaline Phosphatase: 81 IU/L (ref 39–117)
BILIRUBIN TOTAL: 0.5 mg/dL (ref 0.0–1.2)
BUN/Creatinine Ratio: 10 (ref 9–23)
BUN: 9 mg/dL (ref 6–24)
CHLORIDE: 96 mmol/L (ref 96–106)
CO2: 26 mmol/L (ref 20–29)
Calcium: 10 mg/dL (ref 8.7–10.2)
Creatinine, Ser: 0.93 mg/dL (ref 0.57–1.00)
GFR calc non Af Amer: 68 mL/min/{1.73_m2} (ref >59)
GFR, EST AFRICAN AMERICAN: 79 mL/min/{1.73_m2} (ref >59)
Globulin, Total: 2.6 g/dL (ref 1.5–4.5)
Glucose: 127 mg/dL — ABNORMAL HIGH (ref 65–99)
POTASSIUM: 3.5 mmol/L (ref 3.5–5.2)
Sodium: 139 mmol/L (ref 134–144)
Total Protein: 7.4 g/dL (ref 6.0–8.5)

## 2018-04-16 LAB — LIPID PANEL
CHOL/HDL RATIO: 3 ratio (ref 0.0–4.4)
Cholesterol, Total: 158 mg/dL (ref 100–199)
HDL: 52 mg/dL (ref >39)
LDL CALC: 81 mg/dL (ref 0–99)
Triglycerides: 126 mg/dL (ref 0–149)
VLDL CHOLESTEROL CAL: 25 mg/dL (ref 5–40)

## 2018-04-16 LAB — HEMOGLOBIN A1C
Est. average glucose Bld gHb Est-mCnc: 151 mg/dL
Hgb A1c MFr Bld: 6.9 % — ABNORMAL HIGH (ref 4.8–5.6)

## 2018-04-16 LAB — TSH: TSH: 1.78 u[IU]/mL (ref 0.450–4.500)

## 2018-04-21 ENCOUNTER — Telehealth: Payer: Self-pay

## 2018-04-21 NOTE — Telephone Encounter (Signed)
Per Dr. Enid Derry, I contacted this patient to see if she was in fact taking 2 different Ace Inhibitors as it was mentioned on her [facility], but there was no answer.  A message was left for her to give [place] a call back when she got the chance.

## 2018-04-22 ENCOUNTER — Telehealth: Payer: Self-pay | Admitting: Family Medicine

## 2018-04-22 DIAGNOSIS — E1169 Type 2 diabetes mellitus with other specified complication: Secondary | ICD-10-CM | POA: Insufficient documentation

## 2018-04-22 DIAGNOSIS — E119 Type 2 diabetes mellitus without complications: Secondary | ICD-10-CM | POA: Insufficient documentation

## 2018-04-22 NOTE — Telephone Encounter (Signed)
I talked with patient about new diagnosis of type 2 diabetes Referral for diabetic educator Avoid whites (rice, potatoes, sugar, flour)  [facility] staff Please book patient for an appointment at 4:00 pm on Thursday July 11th She is aware; reason: new diagnosis of type 2 diabetes

## 2018-04-22 NOTE — Telephone Encounter (Signed)
Per Dr Sanda Klein request I have scheduled the patient an appt for 04-30-18 @ 4pm.

## 2018-04-30 ENCOUNTER — Ambulatory Visit: Payer: Managed Care, Other (non HMO) | Admitting: Family Medicine

## 2018-04-30 ENCOUNTER — Encounter: Payer: Self-pay | Admitting: Family Medicine

## 2018-04-30 VITALS — BP 122/84 | HR 100 | Temp 98.4°F | Resp 12 | Ht 64.0 in | Wt 156.4 lb

## 2018-04-30 DIAGNOSIS — Z5181 Encounter for therapeutic drug level monitoring: Secondary | ICD-10-CM

## 2018-04-30 DIAGNOSIS — E119 Type 2 diabetes mellitus without complications: Secondary | ICD-10-CM | POA: Diagnosis not present

## 2018-04-30 DIAGNOSIS — E785 Hyperlipidemia, unspecified: Secondary | ICD-10-CM

## 2018-04-30 DIAGNOSIS — I10 Essential (primary) hypertension: Secondary | ICD-10-CM

## 2018-04-30 MED ORDER — METFORMIN HCL ER 500 MG PO TB24: 500.0000 mg | ORAL_TABLET | Freq: Every day | ORAL | 1 refills | Status: DC

## 2018-04-30 NOTE — Progress Notes (Signed)
BP 122/84   Pulse 100   Temp 98.4 F (36.9 C) (Oral)   Resp 12   Ht 5' 4"  (1.626 m)   Wt 156 lb 6.4 oz (70.9 kg)   SpO2 96%   BMI 26.85 kg/m    Subjective:    Patient ID: Samantha Stephens, female    DOB: January 13, 1961, 57 y.o.   MRN: [id number]  HPI: Samantha Stephens is a 57 y.o. female  Chief Complaint  Patient presents with  . Diabetes    new onset    HPI Patient is here to discuss new onset of type 2 diabetes Her mother had diabetes in her 28s Lab Results  Component Value Date   HGBA1C 6.9 (H) 04/15/2018  She has started back to exercising; walking in the evenings again She is going to go to the diabetic education classes No dry mouth; getting eyes checked next week, and she will tell her eye doctor about new diagnosis She did the GTT during pregnancy and it came out okay  Blood pressure; on beta-blocker and HCTZ; had reaction previously to ARB  High cholesterol; already on a statin; she eats a lot of baked chicken; some steak, every two weeks or so; pork chop baked weekly; fish salmon weekly or shrimp; hamburger once or twice a week, might be in spaghetti or taco; no real breakfast, just on Saturday or Sunday, no big breakfasts now really; really improving diet, cherries, fruits, nuts, oatmeal, bananas  She went to a cardiologist, was seen at ER and decided to see specialist; note from Dr. Clayborn Bigness reviewed; going to have SPECT, echo, stress EKG  Immunizations: she will get PPSV-23 at next visit  Depression screen [facility] 2/9 04/13/2018 01/22/2018 09/15/2017 08/29/2017 03/10/2017  Decreased Interest 1 0 0 0 0  Down, Depressed, Hopeless 1 0 0 0 0  PHQ - 2 Score 2 0 0 0 0  Altered sleeping 3 - - - -  Tired, decreased energy 1 - - - -  Change in appetite 3 - - - -  Feeling bad or failure about yourself  0 - - - -  Trouble concentrating 2 - - - -  Moving slowly or fidgety/restless 0 - - - -  Suicidal thoughts 0 - - - -  PHQ-9 Score 11 - - - -  Difficult doing  work/chores Somewhat difficult - - - -    Relevant past medical, surgical, family and social history reviewed Past Medical History:  Diagnosis Date  . Arthritis   . Chronic mid back pain 08/18/2015  . 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 Family hx of colon cancer requiring screening colonoscopy 03/08/2016  . Headache   . Hyperlipidemia   . Hypertension   . Hypokalemia   . Palpitations   . Scoliosis 03/08/2016  . Sleep apnea   . Stress headaches    Past Surgical History:  Procedure Laterality Date  . ABDOMINAL HYSTERECTOMY    . CESAREAN SECTION     1  . COLONOSCOPY    . CYSTOSCOPY W/ RETROGRADES Bilateral 11/25/2016   Procedure: CYSTOSCOPY WITH RETROGRADE PYELOGRAM;  Surgeon: Hollice Espy, MD;  Location: [facility] ORS;  Service: Urology;  Laterality: Bilateral;  . EYE SURGERY     lasix eye surgery   Family History  Problem Relation Age of Onset  . Arthritis Mother   . COPD Mother   . Depression Mother   . Diabetes Mother   . Hyperlipidemia Mother   . Hypertension Mother   .  Cataracts Mother   . Heart disease Mother   . Cancer Father        kidney and prostate cancer, colon cancer  . Hearing loss Father   . Kidney disease Father   . Cataracts Father   . Asthma Sister   . Cancer Paternal Grandmother   . Mental illness Sister   . Breast cancer Other    Social History   Tobacco Use  . Smoking status: Former Research scientist (life sciences)  . Smokeless tobacco: Never Used  . Tobacco comment: but only for a short amount of time  Substance Use Topics  . Alcohol use: Yes    Alcohol/week: 0.0 oz    Comment: occasional glass of wine  . Drug use: No    Interim medical history since last visit reviewed. Allergies and medications reviewed  Review of Systems Per HPI unless specifically indicated above     Objective:    BP 122/84   Pulse 100   Temp 98.4 F (36.9 C) (Oral)   Resp 12   Ht 5' 4"  (1.626 m)   Wt 156 lb 6.4 oz (70.9 kg)   SpO2 96%   BMI 26.85 kg/m   Wt  Readings from Last 3 Encounters:  04/30/18 156 lb 6.4 oz (70.9 kg)  04/15/18 156 lb (70.8 kg)  04/13/18 156 lb 8 oz (71 kg)    Physical Exam  Constitutional: She appears well-developed and well-nourished. No distress.  HENT:  Head: Normocephalic and atraumatic.  Eyes: EOM are normal. No scleral icterus.  Neck: No thyromegaly present.  Cardiovascular: Normal rate, regular rhythm and normal heart sounds.  No murmur heard. Pulmonary/Chest: Effort normal and breath sounds normal. No respiratory distress. She has no wheezes.  Abdominal: Soft. Bowel sounds are normal. She exhibits no distension.  Musculoskeletal: Normal range of motion. She exhibits no edema.  Neurological: She is alert. She exhibits normal muscle tone.  Skin: Skin is warm and dry. She is not diaphoretic. No pallor.  Psychiatric: She has a normal mood and affect. Her mood appears not anxious. She does not exhibit a depressed mood.  Good eye contact with examiner   Diabetic Foot Form - Detailed   Diabetic Foot Exam - detailed Diabetic Foot exam was performed with the following findings:  Yes 04/30/2018  4:29 PM  Visual Foot Exam completed.:  Yes  Pulse Foot Exam completed.:  Yes  Right Dorsalis Pedis:  Present Left Dorsalis Pedis:  Present  Sensory Foot Exam Completed.:  Yes Semmes-Weinstein Monofilament Test R Site 1-Great Toe:  Pos L Site 1-Great Toe:  Pos          Assessment & Plan:   Problem List Items Addressed This Visit      Cardiovascular and Mediastinum   Hypertension goal BP (blood pressure) < 130/80    Discussed new goal blood pressure, lower with new diagnosis of diabetes        Endocrine   Type 2 diabetes mellitus ([place]) - Primary    New onset diagnosis; foot exam by MD; she will take the class; discussed options; take metformin just 500 mg daily XR version; reassess in 3 months with another A1c; eye exam coming up soon and she will let them know about new diagnosis      Relevant Medications    metFORMIN (GLUCOPHAGE XR) 500 MG 24 hr tablet   Other Relevant Orders   Urine Microalbumin w/creat. ratio   Microalbumin / creatinine urine ratio   Hemoglobin A1c     Other  Medication monitoring encounter   Relevant Orders   Comprehensive metabolic panel   Hyperlipidemia LDL goal <100    Discussed new goal LDL, less than 70 ideally; check lipids; on a statin      Relevant Orders   Lipid panel      Follow up plan: No follow-ups on file.  An after-visit summary was printed and given to the patient at [place].  Please see the patient instructions which may contain other information and recommendations beyond what is mentioned above in the assessment and plan.  Meds ordered this encounter  Medications  . metFORMIN (GLUCOPHAGE XR) 500 MG 24 hr tablet    Sig: Take 1 tablet (500 mg total) by mouth daily with breakfast.    Dispense:  90 tablet    Refill:  1    Orders Placed This Encounter  Procedures  . Urine Microalbumin w/creat. ratio  . Microalbumin / creatinine urine ratio  . Lipid panel  . Hemoglobin A1c  . Comprehensive metabolic panel

## 2018-04-30 NOTE — Assessment & Plan Note (Signed)
New onset diagnosis; foot exam by MD; she will take the class; discussed options; take metformin just 500 mg daily XR version; reassess in 3 months with another A1c; eye exam coming up soon and she will let them know about new diagnosis

## 2018-04-30 NOTE — Patient Instructions (Addendum)
Please do let your eye doctor know that you have type 2 diabetes and have them send [place] a copy of your eye exam  Start the metformin to help regulate your sugars  We'll see you back in 3 months and recheck your labs a day or two before  Diabetes Mellitus and Standards of Medical Care Managing diabetes (diabetes mellitus) can be complicated. Your diabetes treatment may be managed by a team of health care providers, including:  A diet and nutrition specialist (registered dietitian).  A nurse.  A certified diabetes educator (CDE).  A diabetes specialist (endocrinologist).  An eye doctor.  A primary care provider.  A dentist.  Your health care providers follow a schedule in order to help you get the best quality of care. The following schedule is a general guideline for your diabetes management plan. Your health care providers may also give you more specific instructions. HbA1c ( hemoglobin A1c) test This test provides information about blood sugar (glucose) control over the previous 2-3 months. It is used to check whether your diabetes management plan needs to be adjusted.  If you are meeting your treatment goals, this test is done at least 2 times a year.  If you are not meeting treatment goals or if your treatment goals have changed, this test is done 4 times a year.  Blood pressure test  This test is done at every routine medical visit. For most people, the goal is less than 130/80. Ask your health care provider what your goal blood pressure should be. Dental and eye exams  Visit your dentist two times a year.  If you have type 1 diabetes, get an eye exam 3-5 years after you are diagnosed, and then once a year after your first exam. ? If you were diagnosed with type 1 diabetes as a child, get an eye exam when you are age 51 or older and have had diabetes for 3-5 years. After the first exam, you should get an eye exam once a year.  If you have type 2 diabetes, have an eye exam  as soon as you are diagnosed, and then once a year after your first exam. Foot care exam  Visual foot exams are done at every routine medical visit. The exams check for cuts, bruises, redness, blisters, sores, or other problems with the feet.  A complete foot exam is done by your health care provider once a year. This exam includes an inspection of the structure and skin of your feet, and a check of the pulses and sensation in your feet. ? Type 1 diabetes: Get your first exam 3-5 years after diagnosis. ? Type 2 diabetes: Get your first exam as soon as you are diagnosed.  Check your feet every day for cuts, bruises, redness, blisters, or sores. If you have any of these or other problems that are not healing, contact your health care provider. Kidney function test ( urine microalbumin)  This test is done once a year. ? Type 1 diabetes: Get your first test 5 years after diagnosis. ? Type 2 diabetes: Get your first test as soon as you are diagnosed.  If you have chronic kidney disease (CKD), get a serum creatinine and estimated glomerular filtration rate (eGFR) test once a year. Lipid profile (cholesterol, HDL, LDL, triglycerides)  This test should be done when you are diagnosed with diabetes, and every 5 years after the first test. If you are on medicines to lower your cholesterol, you may need to get this  test done every year. ? The goal for LDL is less than 100 mg/dL (5.5 mmol/L). If you are at high risk, the goal is less than 70 mg/dL (3.9 mmol/L). ? The goal for HDL is 40 mg/dL (2.2 mmol/L) for men and 50 mg/dL(2.8 mmol/L) for women. An HDL cholesterol of 60 mg/dL (3.3 mmol/L) or higher gives some protection against heart disease. ? The goal for triglycerides is less than 150 mg/dL (8.3 mmol/L). Immunizations  The yearly flu (influenza) vaccine is recommended for everyone 6 months or older who has diabetes.  The pneumonia (pneumococcal) vaccine is recommended for everyone 2 years or  older who has diabetes. If you are 7 or older, you may get the pneumonia vaccine as a series of two separate shots.  The hepatitis B vaccine is recommended for adults shortly after they have been diagnosed with diabetes.  The Tdap (tetanus, diphtheria, and pertussis) vaccine should be given: ? According to normal childhood vaccination schedules, for children. ? Every 10 years, for adults who have diabetes.  The shingles vaccine is recommended for people who have had chicken pox and are 50 years or older. Mental and emotional health  Screening for symptoms of eating disorders, anxiety, and depression is recommended at the time of diagnosis and afterward as needed. If your screening shows that you have symptoms (you have a positive screening result), you may need further evaluation and be referred to a mental health care provider. Diabetes self-management education  Education about how to manage your diabetes is recommended at diagnosis and ongoing as needed. Treatment plan  Your treatment plan will be reviewed at every medical visit. Summary  Managing diabetes (diabetes mellitus) can be complicated. Your diabetes treatment may be managed by a team of health care providers.  Your health care providers follow a schedule in order to help you get the best quality of care.  Standards of care including having regular physical exams, blood tests, blood pressure monitoring, immunizations, screening tests, and education about how to manage your diabetes.  Your health care providers may also give you more specific instructions based on your individual health. This information is not intended to replace advice given to you by your health care provider. Make sure you discuss any questions you have with your health care provider. Document Released: 08/04/2009 Document Revised: 07/05/2016 Document Reviewed: 07/05/2016 Elsevier Interactive Patient Education  2018 [employer].  Diabetes Mellitus and  Nutrition When you have diabetes (diabetes mellitus), it is very important to have healthy eating habits because your blood sugar (glucose) levels are greatly affected by what you eat and drink. Eating healthy foods in the appropriate amounts, at about the same times every day, can help you:  Control your blood glucose.  Lower your risk of heart disease.  Improve your blood pressure.  Reach or maintain a healthy weight.  Every person with diabetes is different, and each person has different needs for a meal plan. Your health care provider may recommend that you work with a diet and nutrition specialist (dietitian) to make a meal plan that is best for you. Your meal plan may vary depending on factors such as:  The calories you need.  The medicines you take.  Your weight.  Your blood glucose, blood pressure, and cholesterol levels.  Your activity level.  Other health conditions you have, such as heart or kidney disease.  How do carbohydrates affect me? Carbohydrates affect your blood glucose level more than any other type of food. Eating carbohydrates naturally  increases the amount of glucose in your blood. Carbohydrate counting is a method for keeping track of how many carbohydrates you eat. Counting carbohydrates is important to keep your blood glucose at a healthy level, especially if you use insulin or take certain oral diabetes medicines. It is important to know how many carbohydrates you can safely have in each meal. This is different for every person. Your dietitian can help you calculate how many carbohydrates you should have at each meal and for snack. Foods that contain carbohydrates include:  Bread, cereal, rice, pasta, and crackers.  Potatoes and corn.  Peas, beans, and lentils.  Milk and yogurt.  Fruit and juice.  Desserts, such as cakes, cookies, ice cream, and candy.  How does alcohol affect me? Alcohol can cause a sudden decrease in blood glucose  (hypoglycemia), especially if you use insulin or take certain oral diabetes medicines. Hypoglycemia can be a life-threatening condition. Symptoms of hypoglycemia (sleepiness, dizziness, and confusion) are similar to symptoms of having too much alcohol. If your health care provider says that alcohol is safe for you, follow these guidelines:  Limit alcohol intake to no more than 1 drink per day for nonpregnant women and 2 drinks per day for men. One drink equals 12 oz of beer, 5 oz of wine, or 1 oz of hard liquor.  Do not drink on an empty stomach.  Keep yourself hydrated with water, diet soda, or unsweetened iced tea.  Keep in mind that regular soda, juice, and other mixers may contain a lot of sugar and must be counted as carbohydrates.  What are tips for following this plan? Reading food labels  Start by checking the serving size on the label. The amount of calories, carbohydrates, fats, and other nutrients listed on the label are based on one serving of the food. Many foods contain more than one serving per package.  Check the total grams (g) of carbohydrates in one serving. You can calculate the number of servings of carbohydrates in one serving by dividing the total carbohydrates by 15. For example, if a food has 30 g of total carbohydrates, it would be equal to 2 servings of carbohydrates.  Check the number of grams (g) of saturated and trans fats in one serving. Choose foods that have low or no amount of these fats.  Check the number of milligrams (mg) of sodium in one serving. Most people should limit total sodium intake to less than 2,300 mg per day.  Always check the nutrition information of foods labeled as "low-fat" or "nonfat". These foods may be higher in added sugar or refined carbohydrates and should be avoided.  Talk to your dietitian to identify your daily goals for nutrients listed on the label. Shopping  Avoid buying canned, premade, or processed foods. These foods tend  to be high in fat, sodium, and added sugar.  Shop around the outside edge of the grocery store. This includes fresh fruits and vegetables, bulk grains, fresh meats, and fresh dairy. Cooking  Use low-heat cooking methods, such as baking, instead of high-heat cooking methods like deep frying.  Cook using healthy oils, such as olive, canola, or sunflower oil.  Avoid cooking with butter, cream, or high-fat meats. Meal planning  Eat meals and snacks regularly, preferably at the same times every day. Avoid going long periods of time without eating.  Eat foods high in fiber, such as fresh fruits, vegetables, beans, and whole grains. Talk to your dietitian about how many servings of carbohydrates you  can eat at each meal.  Eat 4-6 ounces of lean protein each day, such as lean meat, chicken, fish, eggs, or tofu. 1 ounce is equal to 1 ounce of meat, chicken, or fish, 1 egg, or 1/4 cup of tofu.  Eat some foods each day that contain healthy fats, such as avocado, nuts, seeds, and fish. Lifestyle   Check your blood glucose regularly.  Exercise at least 30 minutes 5 or more days each week, or as told by your health care provider.  Take medicines as told by your health care provider.  Do not use any products that contain nicotine or tobacco, such as cigarettes and e-cigarettes. If you need help quitting, ask your health care provider.  Work with a Social worker or diabetes educator to identify strategies to manage stress and any emotional and social challenges. What are some questions to ask my health care provider?  Do I need to meet with a diabetes educator?  Do I need to meet with a dietitian?  What number can I call if I have questions?  When are the best times to check my blood glucose? Where to find more information:  American Diabetes Association: diabetes.org/food-and-fitness/food  Academy of Nutrition and Dietetics:  PokerClues.dk  [employer] of Diabetes and Digestive and Kidney Diseases (NIH): ContactWire.be Summary  A healthy meal plan will help you control your blood glucose and maintain a healthy lifestyle.  Working with a diet and nutrition specialist (dietitian) can help you make a meal plan that is best for you.  Keep in mind that carbohydrates and alcohol have immediate effects on your blood glucose levels. It is important to count carbohydrates and to use alcohol carefully. This information is not intended to replace advice given to you by your health care provider. Make sure you discuss any questions you have with your health care provider. Document Released: 07/04/2005 Document Revised: 11/11/2016 Document Reviewed: 11/11/2016 Elsevier Interactive Patient Education  [employer].

## 2018-05-01 ENCOUNTER — Ambulatory Visit: Payer: Managed Care, Other (non HMO) | Admitting: Family Medicine

## 2018-05-05 NOTE — Assessment & Plan Note (Signed)
Discussed new goal blood pressure, lower with new diagnosis of diabetes

## 2018-05-05 NOTE — Assessment & Plan Note (Addendum)
Discussed new goal LDL, less than 70 ideally; check lipids; on a statin

## 2018-05-06 ENCOUNTER — Other Ambulatory Visit: Payer: Self-pay | Admitting: Family Medicine

## 2018-05-07 LAB — MICROALBUMIN / CREATININE URINE RATIO
Creatinine, Urine: 50.4 mg/dL
Microalb/Creat Ratio: <6 mg/g creat (ref 0.0–30.0)
Microalbumin, Urine: <3 ug/mL

## 2018-05-07 LAB — HM DIABETES EYE EXAM

## 2018-05-18 ENCOUNTER — Encounter: Payer: Managed Care, Other (non HMO) | Attending: Family Medicine | Admitting: Dietician

## 2018-05-18 ENCOUNTER — Encounter: Payer: Self-pay | Admitting: Dietician

## 2018-05-18 VITALS — BP 122/84 | Ht 64.0 in | Wt 154.5 lb

## 2018-05-18 DIAGNOSIS — Z6826 Body mass index (BMI) 26.0-26.9, adult: Secondary | ICD-10-CM | POA: Diagnosis not present

## 2018-05-18 DIAGNOSIS — Z713 Dietary counseling and surveillance: Secondary | ICD-10-CM | POA: Insufficient documentation

## 2018-05-18 DIAGNOSIS — E119 Type 2 diabetes mellitus without complications: Secondary | ICD-10-CM | POA: Diagnosis not present

## 2018-05-18 NOTE — Progress Notes (Signed)
Diabetes Self-Management Education  Visit Type: First/Initial  Appt. Start [id number] Appt. End Time: 7672  05/18/2018  [name], identified by name and date of birth, is a 57 y.o. female with a diagnosis of Diabetes: Type 2.   ASSESSMENT  Blood pressure 122/84, height 5' 4"  (1.626 m), weight 154 lb 8 oz (70.1 kg). Body mass index is 26.52 kg/m.  Diabetes Self-Management Education - 05/18/18 1755      Visit Information   Visit Type  First/Initial      Initial Visit   Diabetes Type  Type 2      Health Coping   How would you rate your overall health?  Good      Psychosocial Assessment   Patient Belief/Attitude about Diabetes  Motivated to manage diabetes    Self-care barriers  None    Self-management support  Doctor's office;Family    Other persons present  Patient    Patient Concerns  Glycemic Control become more fit    Special Needs  None    Preferred Learning Style  Visual    Learning Readiness  Ready    What is the last grade level you completed in school?  BS degree      Pre-Education Assessment   Patient understands the diabetes disease and treatment process.  Needs Instruction    Patient understands incorporating nutritional management into lifestyle.  Needs Instruction    Patient undertands incorporating physical activity into lifestyle.  Needs Instruction    Patient understands using medications safely.  Needs Instruction    Patient understands monitoring blood glucose, interpreting and using results  Needs Instruction    Patient understands prevention, detection, and treatment of acute complications.  Needs Instruction    Patient understands prevention, detection, and treatment of chronic complications.  Needs Instruction    Patient understands how to develop strategies to address psychosocial issues-husband passed away few months ago and pt has been dealing with grief issues by=ut feels like she is handling it pretty well  Needs Instruction    Patient  understands how to develop strategies to promote health/change behavior.  Needs Instruction      Complications   Last HgB A1C per patient/outside source  6.9 %    How often do you check your blood sugar?  0 times/day (not testing)    Have you had a dilated eye exam in the past 12 months?  Yes 1 wk ago    Have you had a dental exam in the past 12 months?  Yes 03-2018    Are you checking your feet?  Yes    How many days per week are you checking your feet?  7      Dietary Intake   Breakfast  eats breakfast at 9a-oatmeal and banana with coffee    Snack (morning)  none     Lunch  eats lunch at 12:30p-sandwich or left-overs    Snack (afternoon)  none     Dinner  eats supper at 6p-meat and vegetables     Snack (evening)  eats popcorn, jello or sugar free cookies at 9p-10p    Beverage(s)  drinks water 4-5x/day, almond milk 0-1x/day, fruit juice 1x/day and sugar free  drinks 2-3x/day      Exercise   Exercise Type  Light (walking / raking leaves)    How many days per week to you exercise?  3    How many minutes per day do you exercise?  45    Total minutes per  week of exercise  135      Patient Education   Previous Diabetes Education  No    Disease state   Definition of diabetes, type 1 and 2, and the diagnosis of diabetes;Factors that contribute to the development of diabetes    Nutrition management   Role of diet in the treatment of diabetes and the relationship between the three main macronutrients and blood glucose level;Food label reading, portion sizes and measuring food.;Carbohydrate counting    Physical activity and exercise   Role of exercise on diabetes management, blood pressure control and cardiac health.;Helped patient identify appropriate exercises in relation to his/her diabetes, diabetes complications and other health issue.    Medications  Reviewed patients medication for diabetes, action, purpose, timing of dose and side effects.    Monitoring  Taught/evaluated SMBG  meter.;Purpose and frequency of SMBG.;Yearly dilated eye exam;Taught/discussed recording of test results and interpretation of SMBG.;Identified appropriate SMBG and/or A1C goals. gave pt One Touch verio Flex meter and instructed on its use-BG 95 ac supper    Chronic complications  Relationship between chronic complications and blood glucose control;Retinopathy and reason for yearly dilated eye exams;Reviewed with patient heart disease, higher risk of, and prevention;Lipid levels, blood glucose control and heart disease    Personal strategies to promote health  Lifestyle issues that need to be addressed for better diabetes care;Helped patient develop diabetes management plan for (enter comment)      Outcomes   Expected Outcomes  Demonstrated interest in learning. Expect positive outcomes       Individualized Plan for Diabetes Self-Management Training:   Learning Objective:  Patient will have a greater understanding of diabetes self-management. Patient education plan is to attend individual and/or group sessions per assessed needs and concerns.   Plan:   Patient Instructions   Check blood sugars 2 x day before breakfast and 2 hrs after supper every day and record  Bring blood sugar records to the next appointment/class  Call your doctor for a prescription for:  1. Meter strips (type) One Touch Verio test strips checking  2 times per day  2. Lancets (type) One Touch Delica lancets checking  2   times per day  Exercise:  Continue walking 30-60 min 4 days/wk  Eat 3 meals day and a small  snack a day at bedtime  Space meals 4-6 hours apart  Eat 2-3 carbohydrate servings/meal + protein  Eat 1 carbohydrate serving/snack + protein  Avoid sugar sweetened drinks (soda, tea, coffee, sports drinks, juices)  Drink plenty of water  Limit intake of fried foods, sweets and snack foods  Get a Sharps container  Return for appointment/classes on:  05-25-18   Expected Outcomes:  Demonstrated  interest in learning. Expect positive outcomes  Education material provided: General meal planning guidelines, One Touch Verio Flex meter   If problems or questions, patient to contact team via: [phone]  Future DSME appointment:  05-25-18

## 2018-05-18 NOTE — Patient Instructions (Signed)
  Check blood sugars 2 x day before breakfast and 2 hrs after supper every day and record  Bring blood sugar records to the next appointment/class  Call your doctor for a prescription for:  1. Meter strips (type) One Touch Verio test strips checking  2 times per day  2. Lancets (type) One Touch Delica lancets checking  2   times per day  Exercise:  Continue walking 30-60 min 4 days/wk  Eat 3 meals day and a small  snack a day at bedtime  Space meals 4-6 hours apart  Eat 2-3 carbohydrate servings/meal + protein  Eat 1 carbohydrate serving/snack + protein  Avoid sugar sweetened drinks (soda, tea, coffee, sports drinks, juices)  Drink plenty of water  Limit intake of fried foods, sweets and snack foods  Get a Sharps container  Return for appointment/classes on:

## 2018-05-23 LAB — COMPREHENSIVE METABOLIC PANEL
A/G RATIO: 2 (ref 1.2–2.2)
ALBUMIN: 4.8 g/dL (ref 3.5–5.5)
ALK PHOS: 72 IU/L (ref 39–117)
ALT: 18 IU/L (ref 0–32)
AST: 20 IU/L (ref 0–40)
BUN / CREAT RATIO: 15 (ref 9–23)
BUN: 14 mg/dL (ref 6–24)
Bilirubin Total: 0.4 mg/dL (ref 0.0–1.2)
CALCIUM: 9.7 mg/dL (ref 8.7–10.2)
CO2: 26 mmol/L (ref 20–29)
Chloride: 98 mmol/L (ref 96–106)
Creatinine, Ser: 0.96 mg/dL (ref 0.57–1.00)
GFR calc Af Amer: 76 mL/min/{1.73_m2} (ref >59)
GFR, EST NON AFRICAN AMERICAN: 66 mL/min/{1.73_m2} (ref >59)
GLOBULIN, TOTAL: 2.4 g/dL (ref 1.5–4.5)
Glucose: 112 mg/dL — ABNORMAL HIGH (ref 65–99)
POTASSIUM: 3.9 mmol/L (ref 3.5–5.2)
SODIUM: 142 mmol/L (ref 134–144)
Total Protein: 7.2 g/dL (ref 6.0–8.5)

## 2018-05-23 LAB — LIPID PANEL
Chol/HDL Ratio: 2.6 ratio (ref 0.0–4.4)
Cholesterol, Total: 135 mg/dL (ref 100–199)
HDL: 52 mg/dL (ref >39)
LDL Calculated: 66 mg/dL (ref 0–99)
TRIGLYCERIDES: 87 mg/dL (ref 0–149)
VLDL CHOLESTEROL CAL: 17 mg/dL (ref 5–40)

## 2018-05-23 LAB — HEMOGLOBIN A1C
Est. average glucose Bld gHb Est-mCnc: 148 mg/dL
Hgb A1c MFr Bld: 6.8 % — ABNORMAL HIGH (ref 4.8–5.6)

## 2018-05-25 ENCOUNTER — Ambulatory Visit: Payer: Managed Care, Other (non HMO)

## 2018-05-25 ENCOUNTER — Encounter: Payer: Self-pay | Admitting: Dietician

## 2018-05-25 ENCOUNTER — Ambulatory Visit: Payer: Managed Care, Other (non HMO) | Admitting: Family Medicine

## 2018-05-25 ENCOUNTER — Telehealth: Payer: Self-pay

## 2018-05-25 DIAGNOSIS — I10 Essential (primary) hypertension: Secondary | ICD-10-CM

## 2018-05-25 DIAGNOSIS — E119 Type 2 diabetes mellitus without complications: Secondary | ICD-10-CM

## 2018-05-25 DIAGNOSIS — Z5181 Encounter for therapeutic drug level monitoring: Secondary | ICD-10-CM

## 2018-05-25 DIAGNOSIS — E785 Hyperlipidemia, unspecified: Secondary | ICD-10-CM

## 2018-05-25 NOTE — Progress Notes (Signed)
Pt called and cancelled class 1 tonight-rescheduled classes beginning 06-29-18

## 2018-05-25 NOTE — Telephone Encounter (Signed)
-----   Message from Arnetha Courser, MD sent at 05/23/2018 12:32 PM EDT ----- Patient was not due to have labs done until a few days before her 3 month follow-up appointment From my last note on July 11th: "We'll see you back in 3 months and recheck your labs a day or two before" Can you straighten out her visit and lab orders please? Thank you

## 2018-06-01 ENCOUNTER — Ambulatory Visit: Payer: Managed Care, Other (non HMO)

## 2018-06-01 ENCOUNTER — Encounter: Payer: Self-pay | Admitting: Family Medicine

## 2018-06-01 ENCOUNTER — Ambulatory Visit: Payer: Managed Care, Other (non HMO) | Admitting: Family Medicine

## 2018-06-01 VITALS — BP 128/72 | HR 73 | Temp 98.4°F | Resp 14 | Ht 64.0 in | Wt 152.7 lb

## 2018-06-01 DIAGNOSIS — G4700 Insomnia, unspecified: Secondary | ICD-10-CM

## 2018-06-01 DIAGNOSIS — I10 Essential (primary) hypertension: Secondary | ICD-10-CM

## 2018-06-01 DIAGNOSIS — F4320 Adjustment disorder, unspecified: Secondary | ICD-10-CM | POA: Insufficient documentation

## 2018-06-01 DIAGNOSIS — F4321 Adjustment disorder with depressed mood: Secondary | ICD-10-CM | POA: Insufficient documentation

## 2018-06-01 DIAGNOSIS — R51 Headache: Secondary | ICD-10-CM

## 2018-06-01 DIAGNOSIS — E119 Type 2 diabetes mellitus without complications: Secondary | ICD-10-CM | POA: Diagnosis not present

## 2018-06-01 DIAGNOSIS — R519 Headache, unspecified: Secondary | ICD-10-CM

## 2018-06-01 MED ORDER — GLUCOSE BLOOD VI STRP: ORAL_STRIP | 3 refills | Status: DC

## 2018-06-01 MED ORDER — HYDROXYZINE PAMOATE 25 MG PO CAPS: 25.0000 mg | ORAL_CAPSULE | Freq: Every evening | ORAL | 0 refills | Status: DC | PRN

## 2018-06-01 MED ORDER — ONETOUCH ULTRASOFT LANCETS MISC: 3 refills | Status: DC

## 2018-06-01 NOTE — Assessment & Plan Note (Signed)
Controlled today 

## 2018-06-01 NOTE — Assessment & Plan Note (Addendum)
Foot exam by MD today; next labs 3 months after last

## 2018-06-01 NOTE — Progress Notes (Signed)
BP 128/72 (BP Location: Right Arm, Patient Position: Sitting, Cuff Size: Large)   Pulse 73   Temp 98.4 F (36.9 C) (Oral)   Resp 14   Ht 5' 4"  (1.626 m)   Wt 152 lb 11.2 oz (69.3 kg)   SpO2 97%   BMI 26.21 kg/m    Subjective:    Patient ID: Samantha Stephens, female    DOB: 1961-02-13, 57 y.o.   MRN: [id number]  HPI: Samantha Stephens is a 57 y.o. female  Chief Complaint  Patient presents with  . Follow-up    patient is here for her 6 week f/u  . Labs Only    patient needs an order for her upcoming f/u  . Grief  . Insomnia  . Medication Refill    diabetic supplies  . Obesity    HPI Patient is here for six week follow-up; she was newly diagnosed with type 2 diabetes A1c was 6.9 on June 26th; changed her diet, walking; taking the metformin; little bit of loose stools at first; getting more fiber; needs strips  She is still grieving over the loss of her husband; not seeing grief counselor; faith community person has been in touch  She is having issues with insomnia; takes a bath; tries to relax; not watching TV at night; just exhausted; not tried   High cholesterol; LDL is at goal; changed her diet; losing weight on purpose Lab Results  Component Value Date   CHOL 135 05/22/2018   HDL 52 05/22/2018   LDLCALC 66 05/22/2018   TRIG 87 05/22/2018   CHOLHDL 2.6 05/22/2018   Sensation on the top of the head; right on the crown; going on for months  Depression screen [facility] 2/9 06/01/2018 05/18/2018 04/13/2018 01/22/2018 09/15/2017  Decreased Interest 0 0 1 0 0  Down, Depressed, Hopeless 0 0 1 0 0  PHQ - 2 Score 0 0 2 0 0  Altered sleeping 3 - 3 - -  Tired, decreased energy 3 - 1 - -  Change in appetite 0 - 3 - -  Feeling bad or failure about yourself  0 - 0 - -  Trouble concentrating 1 - 2 - -  Moving slowly or fidgety/restless 0 - 0 - -  Suicidal thoughts 0 - 0 - -  PHQ-9 Score 7 - 11 - -  Difficult doing work/chores Somewhat difficult - Somewhat difficult - -     Relevant past medical, surgical, family and social history reviewed Past Medical History:  Diagnosis Date  . Arthritis   . Chronic mid back pain 08/18/2015  . 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 Family hx of colon cancer requiring screening colonoscopy 03/08/2016  . Headache   . Hyperlipidemia   . Hypertension   . Hypokalemia   . Palpitations   . Scoliosis 03/08/2016  . Sleep apnea   . Stress headaches    Past Surgical History:  Procedure Laterality Date  . ABDOMINAL HYSTERECTOMY    . CESAREAN SECTION     1  . COLONOSCOPY    . CYSTOSCOPY W/ RETROGRADES Bilateral 11/25/2016   Procedure: CYSTOSCOPY WITH RETROGRADE PYELOGRAM;  Surgeon: Hollice Espy, MD;  Location: [facility] ORS;  Service: Urology;  Laterality: Bilateral;  . EYE SURGERY     lasix eye surgery   Family History  Problem Relation Age of Onset  . Arthritis Mother   . COPD Mother   . Depression Mother   . Diabetes Mother   . Hyperlipidemia Mother   .  Hypertension Mother   . Cataracts Mother   . Heart disease Mother   . Cancer Father        kidney and prostate cancer, colon cancer  . Hearing loss Father   . Kidney disease Father   . Cataracts Father   . Asthma Sister   . Cancer Paternal Grandmother   . Mental illness Sister   . Breast cancer Other    Social History   Tobacco Use  . Smoking status: Former Research scientist (life sciences)  . Smokeless tobacco: Never Used  . Tobacco comment: but only for a short amount of time  Substance Use Topics  . Alcohol use: Yes    Alcohol/week: 0.0 - 1.0 standard drinks    Comment: occasional glass of wine  . Drug use: No    Interim medical history since last visit reviewed. Allergies and medications reviewed  Review of Systems Per HPI unless specifically indicated above     Objective:    BP 128/72 (BP Location: Right Arm, Patient Position: Sitting, Cuff Size: Large)   Pulse 73   Temp 98.4 F (36.9 C) (Oral)   Resp 14   Ht 5' 4"  (1.626 m)   Wt 152 lb 11.2 oz  (69.3 kg)   SpO2 97%   BMI 26.21 kg/m   Wt Readings from Last 3 Encounters:  06/01/18 152 lb 11.2 oz (69.3 kg)  05/18/18 154 lb 8 oz (70.1 kg)  04/30/18 156 lb 6.4 oz (70.9 kg)    Physical Exam  Constitutional: She appears well-developed and well-nourished. No distress.  HENT:  Head: Normocephalic and atraumatic.  Eyes: EOM are normal. No scleral icterus.  Neck: No thyromegaly present.  Cardiovascular: Normal rate, regular rhythm and normal heart sounds.  No murmur heard. Pulmonary/Chest: Effort normal and breath sounds normal. No respiratory distress. She has no wheezes.  Abdominal: Soft. Bowel sounds are normal. She exhibits no distension.  Musculoskeletal: She exhibits no edema.  Neurological: She is alert.  Skin: Skin is warm and dry. She is not diaphoretic. No pallor.  Psychiatric: She has a normal mood and affect.  Good eye contact with examiner; not tearful   Diabetic Foot Form - Detailed   Diabetic Foot Exam - detailed Diabetic Foot exam was performed with the following findings:  Yes 06/01/2018  8:41 AM  Visual Foot Exam completed.:  Yes  Pulse Foot Exam completed.:  Yes  Right Dorsalis Pedis:  Present Left Dorsalis Pedis:  Present  Sensory Foot Exam Completed.:  Yes Semmes-Weinstein Monofilament Test R Site 1-Great Toe:  Pos L Site 1-Great Toe:  Pos        Results for orders placed or performed in visit on 04/30/18  Lipid panel  Result Value Ref Range   Cholesterol, Total 135 100 - 199 mg/dL   Triglycerides 87 0 - 149 mg/dL   HDL 52 >39 mg/dL   VLDL Cholesterol Cal 17 5 - 40 mg/dL   LDL Calculated 66 0 - 99 mg/dL   Chol/HDL Ratio 2.6 0.0 - 4.4 ratio  Hemoglobin A1c  Result Value Ref Range   Hgb A1c MFr Bld 6.8 (H) 4.8 - 5.6 %   Est. average glucose Bld gHb Est-mCnc 148 mg/dL  Comprehensive metabolic panel  Result Value Ref Range   Glucose 112 (H) 65 - 99 mg/dL   BUN 14 6 - 24 mg/dL   Creatinine, Ser 0.96 0.57 - 1.00 mg/dL   GFR calc non Af Amer 66 >59  mL/min/1.73   GFR calc Af Wyvonnia Lora  76 >59 mL/min/1.73   BUN/Creatinine Ratio 15 9 - 23   Sodium 142 134 - 144 mmol/L   Potassium 3.9 3.5 - 5.2 mmol/L   Chloride 98 96 - 106 mmol/L   CO2 26 20 - 29 mmol/L   Calcium 9.7 8.7 - 10.2 mg/dL   Total Protein 7.2 6.0 - 8.5 g/dL   Albumin 4.8 3.5 - 5.5 g/dL   Globulin, Total 2.4 1.5 - 4.5 g/dL   Albumin/Globulin Ratio 2.0 1.2 - 2.2   Bilirubin Total 0.4 0.0 - 1.2 mg/dL   Alkaline Phosphatase 72 39 - 117 IU/L   AST 20 0 - 40 IU/L   ALT 18 0 - 32 IU/L  Microalbumin / creatinine urine ratio  Result Value Ref Range   Creatinine, Urine 50.4 Not Estab. mg/dL   Microalbumin, Urine <3.0 Not Estab. ug/mL   Microalb/Creat Ratio <6.0 0.0 - 30.0 mg/g creat      Assessment & Plan:   Problem List Items Addressed This Visit      Cardiovascular and Mediastinum   Hypertension goal BP (blood pressure) < 130/80 - Primary    Controlled today        Endocrine   Type 2 diabetes mellitus ([place])    Foot exam by MD today; next labs 3 months after last      Relevant Medications   glucose blood (ONETOUCH VERIO) test strip     Other   Insomnia, controlled    Not sleeping well; she did not tolerate trazodone; will use hydroxyzine pamoate      Grief reaction    Stopped the SSRI; refer to psychologist; able to function      Relevant Orders   Ambulatory referral to Psychology    Other Visit Diagnoses    Chronic nonintractable headache, unspecified headache type       refer to neurologist   Relevant Orders   Ambulatory referral to Neurology       Follow up plan: Return in about 9 weeks (around 08/03/2018) for follow-up visit with Dr. Sanda Klein; last 3-5 days prior.  An after-visit summary was printed and given to the patient at [facility].  Please see the patient instructions which may contain other information and recommendations beyond what is mentioned above in the assessment and plan.  Meds ordered this encounter  Medications  . hydrOXYzine  (VISTARIL) 25 MG capsule    Sig: Take 1-2 capsules (25-50 mg total) by mouth at bedtime as needed.    Dispense:  40 capsule    Refill:  0  . glucose blood (ONETOUCH VERIO) test strip    Sig: Check fingerstick blood sugars once a day before breakfast; Dx E11.9; LON 99 months    Dispense:  100 each    Refill:  3  . Lancets (ONETOUCH ULTRASOFT) lancets    Sig: Check fingerstick blood sugars once a day before breakfast; Dx E11.9, LON 99 months    Dispense:  100 each    Refill:  3    Orders Placed This Encounter  Procedures  . Ambulatory referral to Psychology  . Ambulatory referral to Neurology    Face-to-face time with patient was more than 25 minutes, >50% time spent counseling and coordination of care

## 2018-06-01 NOTE — Assessment & Plan Note (Addendum)
Not sleeping well; she did not tolerate trazodone; will use hydroxyzine pamoate

## 2018-06-01 NOTE — Patient Instructions (Signed)
We'll have you see the counselor and the neurologist Keep up the great job with diet and walking and healthy behaviors Return for follow-up with labs just a few days prior

## 2018-06-01 NOTE — Assessment & Plan Note (Signed)
Stopped the SSRI; refer to psychologist; able to function

## 2018-06-08 ENCOUNTER — Ambulatory Visit: Payer: Managed Care, Other (non HMO)

## 2018-06-17 ENCOUNTER — Other Ambulatory Visit: Payer: Self-pay | Admitting: Family Medicine

## 2018-06-24 ENCOUNTER — Other Ambulatory Visit: Payer: Self-pay | Admitting: Family Medicine

## 2018-06-24 MED ORDER — CHLORTHALIDONE 25 MG PO TABS: 25.0000 mg | ORAL_TABLET | Freq: Every day | ORAL | 0 refills | Status: DC

## 2018-06-29 ENCOUNTER — Ambulatory Visit: Payer: Managed Care, Other (non HMO)

## 2018-06-29 ENCOUNTER — Encounter: Payer: Self-pay | Admitting: Dietician

## 2018-06-29 NOTE — Progress Notes (Signed)
Pt called and cancelled tonight's class 1 due to father being placed in hospice

## 2018-07-06 ENCOUNTER — Ambulatory Visit: Payer: Managed Care, Other (non HMO)

## 2018-07-06 ENCOUNTER — Encounter: Payer: Self-pay | Admitting: Dietician

## 2018-07-06 NOTE — Progress Notes (Signed)
Called pt-no answer-left message for pt to call back and reschedule classes

## 2018-07-13 ENCOUNTER — Ambulatory Visit: Payer: Managed Care, Other (non HMO)

## 2018-07-20 ENCOUNTER — Encounter: Payer: Self-pay | Admitting: Dietician

## 2018-08-03 ENCOUNTER — Ambulatory Visit: Payer: Managed Care, Other (non HMO) | Admitting: Family Medicine

## 2018-08-12 ENCOUNTER — Other Ambulatory Visit: Payer: Self-pay | Admitting: Nurse Practitioner

## 2018-08-12 NOTE — Telephone Encounter (Signed)
Too soon for rx, 90 day supply rx'd less than one month ago

## 2018-08-13 NOTE — Telephone Encounter (Signed)
Please make sure patient is taking her medicine correctly Samantha Stephens just sent a 90 day supply on august 29th, less than two months ago; even if it took a week to get to her from mail order and she started it in early September, she shouldn't be out yet

## 2018-08-13 NOTE — Telephone Encounter (Signed)
Pt states that she is not out of medication and is taking it correctly.

## 2018-08-17 ENCOUNTER — Ambulatory Visit (INDEPENDENT_AMBULATORY_CARE_PROVIDER_SITE_OTHER): Payer: Managed Care, Other (non HMO) | Admitting: Family Medicine

## 2018-08-17 ENCOUNTER — Encounter: Payer: Self-pay | Admitting: Family Medicine

## 2018-08-19 ENCOUNTER — Ambulatory Visit: Payer: Managed Care, Other (non HMO) | Admitting: Family Medicine

## 2018-08-23 NOTE — Progress Notes (Signed)
I did not see this patient I was not in the office this day

## 2018-08-28 ENCOUNTER — Other Ambulatory Visit: Payer: Self-pay | Admitting: Nurse Practitioner

## 2018-09-01 DIAGNOSIS — R519 Headache, unspecified: Secondary | ICD-10-CM | POA: Insufficient documentation

## 2018-09-01 NOTE — Telephone Encounter (Signed)
Please see the note in the lab section from August 2019; she is due for visit and fasting labs; she can have her labs done a few days prior to her visit I'll approve the medicine refill and we'll see her soon

## 2018-09-01 NOTE — Telephone Encounter (Signed)
Pt had to be rescheduled due to when you were out twice

## 2018-09-01 NOTE — Telephone Encounter (Signed)
Thank you :)

## 2018-09-11 ENCOUNTER — Ambulatory Visit: Payer: Managed Care, Other (non HMO) | Admitting: Family Medicine

## 2018-09-11 ENCOUNTER — Encounter: Payer: Self-pay | Admitting: Family Medicine

## 2018-09-11 VITALS — BP 118/70 | HR 85 | Temp 98.0°F | Ht 64.0 in | Wt 153.7 lb

## 2018-09-11 DIAGNOSIS — R1031 Right lower quadrant pain: Secondary | ICD-10-CM | POA: Diagnosis not present

## 2018-09-11 DIAGNOSIS — E785 Hyperlipidemia, unspecified: Secondary | ICD-10-CM | POA: Diagnosis not present

## 2018-09-11 DIAGNOSIS — Z23 Encounter for immunization: Secondary | ICD-10-CM

## 2018-09-11 DIAGNOSIS — M79601 Pain in right arm: Secondary | ICD-10-CM

## 2018-09-11 DIAGNOSIS — I10 Essential (primary) hypertension: Secondary | ICD-10-CM | POA: Diagnosis not present

## 2018-09-11 DIAGNOSIS — E119 Type 2 diabetes mellitus without complications: Secondary | ICD-10-CM

## 2018-09-11 DIAGNOSIS — Z5181 Encounter for therapeutic drug level monitoring: Secondary | ICD-10-CM

## 2018-09-11 DIAGNOSIS — M79602 Pain in left arm: Secondary | ICD-10-CM

## 2018-09-11 MED ORDER — METOPROLOL SUCCINATE ER 25 MG PO TB24: 25.0000 mg | ORAL_TABLET | Freq: Every day | ORAL | 1 refills | Status: DC

## 2018-09-11 MED ORDER — CHLORTHALIDONE 25 MG PO TABS: 25.0000 mg | ORAL_TABLET | Freq: Every day | ORAL | 1 refills | Status: DC

## 2018-09-11 NOTE — Patient Instructions (Addendum)
Stop the simvastatin for two weeks and see if the arm pain goes away; call me or write with an update  You have received the Pneumovax vaccine (PPSV-23) and you will not need another booster of this for the rest of your life per current ACIP guidelines  Try to follow the DASH guidelines (DASH stands for Dietary Approaches to Stop Hypertension). Try to limit the sodium in your diet to no more than 1,54m of sodium per day. Certainly try to not exceed 2,000 mg per day at the very most. Do not add salt when cooking or at the table.  Check the sodium amount on labels when shopping, and choose items lower in sodium when given a choice. Avoid or limit foods that already contain a lot of sodium. Eat a diet rich in fruits and vegetables and whole grains, and try to lose weight if overweight or obese  Try to limit saturated fats in your diet (bologna, hot dogs, barbeque, cheeseburgers, hamburgers, steak, bacon, sausage, cheese, etc.) and get more fresh fruits, vegetables, and whole grains  Please do see your eye doctor regularly, and have your eyes examined every year (or more often per his or her recommendation) Check your feet every night and let me know right away of any sores, infections, numbness, etc. Try to limit sweets, white bread, white rice, white potatoes It is okay with me for you to not check your fingerstick blood sugars unless you are interested and feel it would be helpful for you

## 2018-09-11 NOTE — Assessment & Plan Note (Signed)
Last LDL was at goal, less than 70; continue statin; limit saturated fats

## 2018-09-11 NOTE — Assessment & Plan Note (Signed)
Excellent control today; continue medicines; try to follow the DASH guidelines

## 2018-09-11 NOTE — Progress Notes (Signed)
BP 118/70   Pulse 85   Temp 98 F (36.7 C) (Oral)   Ht 5' 4"  (1.626 m)   Wt 153 lb 11.2 oz (69.7 kg)   SpO2 95%   BMI 26.38 kg/m    Subjective:    Patient ID: Samantha Stephens, female    DOB: [birth year], 57 y.o.   MRN: [id number]  HPI: Samantha Stephens is a 57 y.o. female  Chief Complaint  Patient presents with  . Follow-up  . 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 Clayborn Bigness this summer, had negative evaluation including echocardiogram and Myoview  Lab Results  Component Value Date   HGBA1C 6.8 (H) 05/22/2018   Hypertension; she is taking chlorthalidone and metoprololol; BP is normal with the medicine; not using much salt  GERD; she was taking famotidine; not an issue any  more  She has ambien on her medicine list; she had been having a headache, saw Dr. Jefm Bryant clinic provider; she takes just half of an [place]; has OSA and has headache disorder; neurology will continue to prescribe; patient is aware that this is not my drug of choice for insomnia; she is not getting up in the middle of the night except to go to the bathroom  She had low K+ and was given a short course of supplement  High cholesterol; arms are hurting though  Lab Results  Component Value Date   CHOL 135 05/22/2018   HDL 52 05/22/2018   LDLCALC 66 05/22/2018   TRIG 87 05/22/2018   CHOLHDL 2.6 05/22/2018   HM list: flu shot today  CT scan abd/pelvis 2017: IMPRESSION: No significant abnormality seen in the abdomen or pelvis.   Electronically Signed   By: Marijo Conception, M.D.   On: 09/18/2016 16:18  Going  to [facility]; lost her husband earlier this year; she and her daughter are dealing with things, working through their grief; she was not sleeping well so is on ambien per neurology  Depression screen [facility] 2/9 09/11/2018 08/17/2018 06/01/2018 05/18/2018 04/13/2018  Decreased Interest 0 0 0 0 1  Down, Depressed, Hopeless 1 0 0 0 1  PHQ - 2 Score 1 0 0 0 2  Altered sleeping 1 - 3 - 3  Tired, decreased energy 1 - 3 - 1  Change in appetite 0 - 0 - 3  Feeling bad or failure about yourself  0 - 0 - 0  Trouble concentrating 0 - 1 - 2  Moving slowly or fidgety/restless 0 - 0 - 0  Suicidal thoughts 0 - 0 - 0  PHQ-9 Score 3 - 7 - 11  Difficult doing work/chores Not difficult at all - Somewhat difficult - Somewhat difficult   Fall Risk  09/11/2018 08/17/2018 06/01/2018 05/18/2018 04/13/2018  Falls in the past year? 0 No No No No  Number falls in past yr: 0 - - - -  Injury with Fall? - - - - -    Relevant past medical, surgical, family and social history reviewed Past Medical History:  Diagnosis Date  .  Arthritis   . Chronic mid back pain 08/18/2015  . 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 Family hx of colon cancer requiring screening colonoscopy 03/08/2016  . Headache   . Hyperlipidemia   . Hypertension   . Hypokalemia   . Palpitations   . Scoliosis 03/08/2016  . Sleep apnea   . Stress headaches    Past Surgical History:  Procedure Laterality Date  . ABDOMINAL HYSTERECTOMY    . CESAREAN SECTION     1  . COLONOSCOPY    . CYSTOSCOPY W/ RETROGRADES Bilateral 11/25/2016   Procedure: CYSTOSCOPY WITH RETROGRADE PYELOGRAM;  Surgeon: Hollice Espy, MD;  Location: [facility] ORS;  Service: Urology;  Laterality: Bilateral;  . EYE SURGERY     lasix eye surgery   Family History  Problem Relation Age of Onset  . Arthritis Mother   . COPD Mother   . Depression Mother   . Diabetes Mother   . Hyperlipidemia Mother   . Hypertension Mother   . Cataracts Mother   . Heart disease Mother     . Cancer Father        kidney and prostate cancer, colon cancer  . Hearing loss Father   . Kidney disease Father   . Cataracts Father   . Asthma Sister   . Cancer Paternal Grandmother   . Mental illness Sister   . Breast cancer Other    Social History   Tobacco Use  . Smoking status: Former Research scientist (life sciences)  . Smokeless tobacco: Never Used  . Tobacco comment: but only for a short amount of time  Substance Use Topics  . Alcohol use: Yes    Alcohol/week: 0.0 - 1.0 standard drinks    Comment: occasional glass of wine  . Drug use: No     Office Visit from 09/11/2018 in [facility]  AUDIT-C Score  1      Interim medical history since last visit reviewed. Allergies and medications reviewed  Review of Systems Per HPI unless specifically indicated above     Objective:    BP 118/70   Pulse 85   Temp 98 F (36.7 C) (Oral)   Ht 5' 4"  (1.626 m)   Wt 153 lb 11.2 oz (69.7 kg)   SpO2 95%   BMI 26.38 kg/m   Wt Readings from Last 3 Encounters:  09/11/18 153 lb 11.2 oz (69.7 kg)  08/17/18 147 lb 14.4 oz (67.1 kg)  06/01/18 152 lb 11.2 oz (69.3 kg)    Physical Exam  Constitutional: She appears well-developed and well-nourished. No distress.  HENT:  Head: Normocephalic and atraumatic.  Eyes: EOM are normal. No scleral icterus.  Neck: No thyromegaly present.  Cardiovascular: Normal rate, regular rhythm and normal heart sounds.  No murmur heard. Pulmonary/Chest: Effort normal and breath sounds normal. No respiratory distress. She has no wheezes.  Abdominal: Soft. Bowel sounds are normal. She exhibits no distension and no mass. There is no tenderness. There is no guarding.  Musculoskeletal: Normal range of motion. She exhibits no edema.  Neurological: She is alert. She exhibits normal muscle tone.  Skin: Skin is warm and dry. She is not diaphoretic. No pallor.  Psychiatric: She has a normal mood and affect. Her behavior is normal. Judgment and thought content  normal.   Diabetic Foot Form - Detailed   Diabetic Foot Exam - detailed Diabetic Foot exam was performed with the following findings:  Yes 09/11/2018  3:18 PM  Visual Foot Exam completed.:  Yes  Pulse Foot Exam completed.:  Yes  Right Dorsalis Pedis:  Present Left Dorsalis Pedis:  Present  Sensory Foot Exam Completed.:  Yes Semmes-Weinstein Monofilament Test R Site 1-Great Toe:  Pos L Site 1-Great Toe:  Pos        Results for orders placed or performed in visit on 04/30/18  Lipid panel  Result Value Ref Range   Cholesterol, Total 135 100 - 199 mg/dL   Triglycerides 87 0 - 149 mg/dL   HDL 52 >39 mg/dL   VLDL Cholesterol Cal 17 5 - 40 mg/dL   LDL Calculated 66 0 - 99 mg/dL   Chol/HDL Ratio 2.6 0.0 - 4.4 ratio  Hemoglobin A1c  Result Value Ref Range   Hgb A1c MFr Bld 6.8 (H) 4.8 - 5.6 %   Est. average glucose Bld gHb Est-mCnc 148 mg/dL  Comprehensive metabolic panel  Result Value Ref Range   Glucose 112 (H) 65 - 99 mg/dL   BUN 14 6 - 24 mg/dL   Creatinine, Ser 0.96 0.57 - 1.00 mg/dL   GFR calc non Af Amer 66 >59 mL/min/1.73   GFR calc Af Amer 76 >59 mL/min/1.73   BUN/Creatinine Ratio 15 9 - 23   Sodium 142 134 - 144 mmol/L   Potassium 3.9 3.5 - 5.2 mmol/L   Chloride 98 96 - 106 mmol/L   CO2 26 20 - 29 mmol/L   Calcium 9.7 8.7 - 10.2 mg/dL   Total Protein 7.2 6.0 - 8.5 g/dL   Albumin 4.8 3.5 - 5.5 g/dL   Globulin, Total 2.4 1.5 - 4.5 g/dL   Albumin/Globulin Ratio 2.0 1.2 - 2.2   Bilirubin Total 0.4 0.0 - 1.2 mg/dL   Alkaline Phosphatase 72 39 - 117 IU/L   AST 20 0 - 40 IU/L   ALT 18 0 - 32 IU/L  Microalbumin / creatinine urine ratio  Result Value Ref Range   Creatinine, Urine 50.4 Not Estab. mg/dL   Microalbumin, Urine <3.0 Not Estab. ug/mL   Microalb/Creat Ratio <6.0 0.0 - 30.0 mg/g creat      Assessment & Plan:   Problem List Items Addressed This Visit      Cardiovascular and Mediastinum   Hypertension goal BP (blood pressure) < 130/80    Excellent control  today; continue medicines; try to follow the DASH guidelines      Relevant Medications   chlorthalidone (HYGROTON) 25 MG tablet   metoprolol succinate (TOPROL-XL) 25 MG 24 hr tablet     Endocrine   Type 2 diabetes mellitus (HCC) - Primary    Check A1c; foot exam by MD today        Other   Medication monitoring encounter   Hyperlipidemia LDL goal <100    Last LDL was at goal, less than 70; continue statin; limit saturated fats      Relevant Medications   chlorthalidone (HYGROTON) 25 MG tablet   metoprolol succinate (TOPROL-XL) 25 MG 24 hr tablet    Other Visit Diagnoses    Right lower quadrant abdominal pain       will check urine and labs today; reviewed last CT scan; advised that if worse or not resolving, let me know and we'll get imaging, do work-up   Relevant Orders   Urinalysis w microscopic + reflex cultur   CBC with Differential/Platelet   Urine Microscopic   Urinalysis, dipstick only   Need for 23-polyvalent pneumococcal polysaccharide vaccine       Relevant Orders   Pneumococcal polysaccharide vaccine  23-valent greater than or equal to 2yo subcutaneous/IM (Completed)   Need for influenza vaccination       Relevant Orders   Flu Vaccine QUAD 6+ mos PF IM (Fluarix Quad PF) (Completed)   Pain in both upper extremities       sounds like it may be related to her statin; will have her stop for two weeks and then report back; she has already had cardiac work-up this year, saw cards       Follow up plan: Return in about 3 months (around 12/12/2018) for follow-up visit with Dr. Sanda Klein.  An after-visit summary was printed and given to the patient at [place].  Please see the patient instructions which may contain other information and recommendations beyond what is mentioned above in the assessment and plan.  Meds ordered this encounter  Medications  . chlorthalidone (HYGROTON) 25 MG tablet    Sig: Take 1 tablet (25 mg total) by mouth daily.    Dispense:  90 tablet     Refill:  1  . metoprolol succinate (TOPROL-XL) 25 MG 24 hr tablet    Sig: Take 1 tablet (25 mg total) by mouth daily.    Dispense:  90 tablet    Refill:  1    Orders Placed This Encounter  Procedures  . Flu Vaccine QUAD 6+ mos PF IM (Fluarix Quad PF)  . Pneumococcal polysaccharide vaccine 23-valent greater than or equal to 2yo subcutaneous/IM  . Urinalysis w microscopic + reflex cultur  . CBC with Differential/Platelet  . Urine Microscopic  . Urinalysis, dipstick only

## 2018-09-11 NOTE — Assessment & Plan Note (Signed)
Check A1c; foot exam by MD today

## 2018-09-15 ENCOUNTER — Encounter: Payer: Self-pay | Admitting: Family Medicine

## 2018-09-26 LAB — CBC WITH DIFFERENTIAL/PLATELET
Basophils Absolute: 0.1 10*3/uL (ref 0.0–0.2)
Basos: 1 %
EOS (ABSOLUTE): 0.3 10*3/uL (ref 0.0–0.4)
EOS: 4 %
HEMATOCRIT: 39 % (ref 34.0–46.6)
HEMOGLOBIN: 13.6 g/dL (ref 11.1–15.9)
IMMATURE GRANULOCYTES: 0 %
Immature Grans (Abs): 0 10*3/uL (ref 0.0–0.1)
LYMPHS ABS: 3.7 10*3/uL — AB (ref 0.7–3.1)
Lymphs: 47 %
MCH: 26 pg — ABNORMAL LOW (ref 26.6–33.0)
MCHC: 34.9 g/dL (ref 31.5–35.7)
MCV: 75 fL — AB (ref 79–97)
MONOCYTES: 6 %
MONOS ABS: 0.5 10*3/uL (ref 0.1–0.9)
NEUTROS PCT: 42 %
Neutrophils Absolute: 3.4 10*3/uL (ref 1.4–7.0)
PLATELETS: 314 10*3/uL (ref 150–450)
RBC: 5.23 x10E6/uL (ref 3.77–5.28)
RDW: 14.9 % (ref 12.3–15.4)
WBC: 8 10*3/uL (ref 3.4–10.8)

## 2018-09-26 LAB — HEMOGLOBIN A1C
Est. average glucose Bld gHb Est-mCnc: 143 mg/dL
Hgb A1c MFr Bld: 6.6 % — ABNORMAL HIGH (ref 4.8–5.6)

## 2018-09-26 LAB — URINALYSIS, MICROSCOPIC ONLY
BACTERIA UA: NONE SEEN
CASTS: NONE SEEN /LPF
RBC MICROSCOPIC, UA: NONE SEEN /HPF (ref 0–2)

## 2018-09-26 LAB — COMPREHENSIVE METABOLIC PANEL
ALBUMIN: 5.1 g/dL (ref 3.5–5.5)
ALT: 21 IU/L (ref 0–32)
AST: 21 IU/L (ref 0–40)
Albumin/Globulin Ratio: 2 (ref 1.2–2.2)
Alkaline Phosphatase: 76 IU/L (ref 39–117)
BUN / CREAT RATIO: 13 (ref 9–23)
BUN: 14 mg/dL (ref 6–24)
Bilirubin Total: 0.3 mg/dL (ref 0.0–1.2)
CO2: 27 mmol/L (ref 20–29)
Calcium: 10.5 mg/dL — ABNORMAL HIGH (ref 8.7–10.2)
Chloride: 96 mmol/L (ref 96–106)
Creatinine, Ser: 1.05 mg/dL — ABNORMAL HIGH (ref 0.57–1.00)
GFR, EST AFRICAN AMERICAN: 68 mL/min/{1.73_m2} (ref >59)
GFR, EST NON AFRICAN AMERICAN: 59 mL/min/{1.73_m2} — AB (ref >59)
GLUCOSE: 104 mg/dL — AB (ref 65–99)
Globulin, Total: 2.6 g/dL (ref 1.5–4.5)
Potassium: 3.3 mmol/L — ABNORMAL LOW (ref 3.5–5.2)
Sodium: 137 mmol/L (ref 134–144)
TOTAL PROTEIN: 7.7 g/dL (ref 6.0–8.5)

## 2018-09-26 LAB — LIPID PANEL
CHOLESTEROL TOTAL: 235 mg/dL — AB (ref 100–199)
Chol/HDL Ratio: 3.8 ratio (ref 0.0–4.4)
HDL: 62 mg/dL (ref >39)
LDL CALC: 135 mg/dL — AB (ref 0–99)
TRIGLYCERIDES: 190 mg/dL — AB (ref 0–149)
VLDL CHOLESTEROL CAL: 38 mg/dL (ref 5–40)

## 2018-09-27 ENCOUNTER — Other Ambulatory Visit: Payer: Self-pay | Admitting: Family Medicine

## 2018-09-27 ENCOUNTER — Encounter: Payer: Self-pay | Admitting: Family Medicine

## 2018-09-27 DIAGNOSIS — E785 Hyperlipidemia, unspecified: Secondary | ICD-10-CM

## 2018-09-27 DIAGNOSIS — E876 Hypokalemia: Secondary | ICD-10-CM

## 2018-09-27 NOTE — Progress Notes (Signed)
Check BMP and Mg2+ next week

## 2018-09-28 ENCOUNTER — Other Ambulatory Visit: Payer: Self-pay | Admitting: Family Medicine

## 2018-09-28 MED ORDER — ROSUVASTATIN CALCIUM 10 MG PO TABS: ORAL_TABLET | ORAL | 0 refills | Status: DC

## 2018-10-21 ENCOUNTER — Other Ambulatory Visit: Payer: Self-pay | Admitting: Family Medicine

## 2018-10-22 NOTE — Telephone Encounter (Signed)
Lab Results  Component Value Date   HGBA1C 6.6 (H) 09/25/2018   Lab Results  Component Value Date   CREATININE 1.05 (H) 09/25/2018   GFR 68  Approved Rx

## 2018-10-26 ENCOUNTER — Other Ambulatory Visit: Payer: Self-pay | Admitting: Family Medicine

## 2018-11-11 ENCOUNTER — Other Ambulatory Visit: Payer: Self-pay | Admitting: Family Medicine

## 2018-11-19 ENCOUNTER — Telehealth: Payer: Self-pay

## 2018-11-19 NOTE — Telephone Encounter (Signed)
Does she need appt or can you call her in something?

## 2018-11-19 NOTE — Telephone Encounter (Signed)
Pt notified and appt scheduled for tomorrow with [facility]

## 2018-11-19 NOTE — Telephone Encounter (Signed)
Please do offer an appointment with me or Suezanne [facility], DNP first available If she is having severe problems, please refer her to walk-in clinic at [facility] Also recommend grief counselor at [facility]

## 2018-11-19 NOTE — Telephone Encounter (Signed)
Copied from [place] [id number]. Topic: General - Other >> Nov 19, 2018 11:33 AM Samantha Stephens wrote: Reason for CRM: Patient is wanting a call back from a nurse in regards to her problems with having anxiety again  Due to the death of her husband. She is starting to feel overwhelmed she is wanting to know should she come in to be seen or if she can be prescribe medication.

## 2018-11-20 ENCOUNTER — Encounter: Payer: Self-pay | Admitting: Nurse Practitioner

## 2018-11-20 ENCOUNTER — Emergency Department
Admission: EM | Admit: 2018-11-20 | Discharge: 2018-11-20 | Disposition: A | Discharge status: Home / Self Care | Payer: Managed Care, Other (non HMO) | Attending: Emergency Medicine | Admitting: Emergency Medicine

## 2018-11-20 ENCOUNTER — Other Ambulatory Visit: Payer: Self-pay

## 2018-11-20 ENCOUNTER — Emergency Department: Payer: Managed Care, Other (non HMO)

## 2018-11-20 ENCOUNTER — Ambulatory Visit (INDEPENDENT_AMBULATORY_CARE_PROVIDER_SITE_OTHER): Payer: Managed Care, Other (non HMO) | Admitting: Nurse Practitioner

## 2018-11-20 VITALS — BP 122/88 | HR 89 | Temp 98.2°F | Resp 16 | Ht 64.0 in | Wt 153.8 lb

## 2018-11-20 DIAGNOSIS — R002 Palpitations: Secondary | ICD-10-CM | POA: Diagnosis present

## 2018-11-20 DIAGNOSIS — Z7982 Long term (current) use of aspirin: Secondary | ICD-10-CM | POA: Diagnosis not present

## 2018-11-20 DIAGNOSIS — I491 Atrial premature depolarization: Secondary | ICD-10-CM | POA: Diagnosis not present

## 2018-11-20 DIAGNOSIS — Z79899 Other long term (current) drug therapy: Secondary | ICD-10-CM | POA: Diagnosis not present

## 2018-11-20 DIAGNOSIS — Z87891 Personal history of nicotine dependence: Secondary | ICD-10-CM | POA: Insufficient documentation

## 2018-11-20 DIAGNOSIS — F419 Anxiety disorder, unspecified: Secondary | ICD-10-CM | POA: Diagnosis not present

## 2018-11-20 DIAGNOSIS — E876 Hypokalemia: Secondary | ICD-10-CM | POA: Diagnosis not present

## 2018-11-20 DIAGNOSIS — E119 Type 2 diabetes mellitus without complications: Secondary | ICD-10-CM | POA: Diagnosis not present

## 2018-11-20 DIAGNOSIS — I10 Essential (primary) hypertension: Secondary | ICD-10-CM | POA: Diagnosis not present

## 2018-11-20 DIAGNOSIS — Z7984 Long term (current) use of oral hypoglycemic drugs: Secondary | ICD-10-CM | POA: Diagnosis not present

## 2018-11-20 LAB — CBC
HCT: 41.4 % (ref 36.0–46.0)
Hemoglobin: 13.9 g/dL (ref 12.0–15.0)
MCH: 25.4 pg — ABNORMAL LOW (ref 26.0–34.0)
MCHC: 33.6 g/dL (ref 30.0–36.0)
MCV: 75.7 fL — ABNORMAL LOW (ref 80.0–100.0)
NRBC: 0 % (ref 0.0–0.2)
Platelets: 304 10*3/uL (ref 150–400)
RBC: 5.47 MIL/uL — AB (ref 3.87–5.11)
RDW: 13.9 % (ref 11.5–15.5)
WBC: 8.8 10*3/uL (ref 4.0–10.5)

## 2018-11-20 LAB — BASIC METABOLIC PANEL
Anion gap: 10 (ref 5–15)
BUN: 17 mg/dL (ref 6–20)
CO2: 30 mmol/L (ref 22–32)
Calcium: 9.3 mg/dL (ref 8.9–10.3)
Chloride: 98 mmol/L (ref 98–111)
Creatinine, Ser: 0.94 mg/dL (ref 0.44–1.00)
GFR calc non Af Amer: >60 mL/min (ref >60)
Glucose, Bld: 144 mg/dL — ABNORMAL HIGH (ref 70–99)
Potassium: 3 mmol/L — ABNORMAL LOW (ref 3.5–5.1)
SODIUM: 138 mmol/L (ref 135–145)

## 2018-11-20 LAB — MAGNESIUM: Magnesium: 2.2 mg/dL (ref 1.7–2.4)

## 2018-11-20 LAB — TROPONIN I: Troponin I: <0.03 ng/mL (ref <0.03)

## 2018-11-20 MED ORDER — POTASSIUM CHLORIDE ER 10 MEQ PO TBCR: 10.0000 meq | EXTENDED_RELEASE_TABLET | Freq: Every day | ORAL | 0 refills | Status: DC

## 2018-11-20 MED ORDER — SODIUM CHLORIDE 0.9% FLUSH: 3.0000 mL | Freq: Once | INTRAVENOUS | Status: DC

## 2018-11-20 MED ORDER — POTASSIUM CHLORIDE CRYS ER 20 MEQ PO TBCR
40.0000 meq | EXTENDED_RELEASE_TABLET | Freq: Once | ORAL | Status: AC
  Administered 2018-11-20: 40 meq via ORAL
  Filled 2018-11-20: qty 2

## 2018-11-20 MED ORDER — HYDROXYZINE PAMOATE 25 MG PO CAPS: 25.0000 mg | ORAL_CAPSULE | Freq: Three times a day (TID) | ORAL | 0 refills | Status: DC | PRN

## 2018-11-20 NOTE — ED Provider Notes (Signed)
[facility] Emergency Department Provider Note  ____________________________________________   First MD Initiated Contact with Patient 11/20/18 2318     (approximate)  I have reviewed the triage vital signs and the nursing notes.   HISTORY  Chief Complaint Palpitations  HPI Samantha Stephens is a 58 y.o. female here for evaluation of palpitations  Has been noticing for a few days that she will occasionally feel like a beat just skips.  She feels little anxious about it.  Reports this is been more common since losing her husband, she has not been eating as well as normal, and she saw her heart doctor today who suggested she should have her potassium checked   No chest pain or trouble breathing.  No fatigue or muscle weakness.  No nausea vomiting.  Takes chlorthalidone for the last couple of months, has been told her potassium is been slightly low in the past.  Saw her doctor today who referred her here to have her potassium checked.  I recent Holter monitor, reports it did not show any episodes of atrial fibrillation.  Sees Dr. Clayborn Bigness as well.  Past Medical History:  Diagnosis Date  . Arthritis   . Chronic mid back pain 08/18/2015  . 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 Family hx of colon cancer requiring screening colonoscopy 03/08/2016  . Headache   . Hyperlipidemia   . Hypertension   . Hypokalemia   . Palpitations   . Scoliosis 03/08/2016  . Sleep apnea   . Stress headaches     Patient Active Problem List   Diagnosis Date Noted  . Grief reaction 06/01/2018  . Type 2 diabetes mellitus ([place]) 04/22/2018  . 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 OSA on CPAP 09/15/2017  . Hepatitis C antibody test positive 03/12/2017  . Preventative health care 03/10/2017  . Hematuria, gross 11/12/2016  . Medication monitoring encounter 09/09/2016  . Chronic hip pain, right 09/09/2016  . Numbness of right foot 09/09/2016  . Scoliosis 03/08/2016   . 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 Family hx of colon cancer requiring screening colonoscopy 03/08/2016  . Situational anxiety 10/27/2015  . Hypertension goal BP (blood pressure) < 130/80 08/18/2015  . Hyperlipidemia LDL goal <100 08/18/2015  . Insomnia, controlled 08/18/2015  . Chronic mid back pain 08/18/2015    Past Surgical History:  Procedure Laterality Date  . ABDOMINAL HYSTERECTOMY    . CESAREAN SECTION     1  . COLONOSCOPY    . CYSTOSCOPY W/ RETROGRADES Bilateral 11/25/2016   Procedure: CYSTOSCOPY WITH RETROGRADE PYELOGRAM;  Surgeon: Hollice Espy, MD;  Location: [facility] ORS;  Service: Urology;  Laterality: Bilateral;  . EYE SURGERY     lasix eye surgery    Prior to Admission medications   Medication Sig Start Date End Date Taking? Authorizing Provider  albuterol (PROVENTIL HFA;VENTOLIN HFA) 108 (90 Base) MCG/ACT inhaler Inhale 2 puffs into the lungs every 4 (four) hours as needed for wheezing or shortness of breath. Patient not taking: Reported on 08/17/2018 12/04/16   Arnetha Courser, MD  aspirin EC 81 MG tablet Take 1 tablet (81 mg total) by mouth daily. 09/09/16   Arnetha Courser, MD  chlorthalidone (HYGROTON) 25 MG tablet Take 1 tablet (25 mg total) by mouth daily. 09/11/18   Arnetha Courser, MD  Cholecalciferol (VITAMIN D3) 2000 units TABS Take 1 capsule by mouth daily.    [provider]  estradiol (ESTRACE VAGINAL) 0.1 MG/GM vaginal cream  Place 1 Applicatorful vaginally 2 (two) times a week. 04/05/16   Arnetha Courser, MD  glucose blood (ONETOUCH VERIO) test strip Check fingerstick blood sugars once a day before breakfast; Dx E11.9; LON 99 months 06/01/18   Arnetha Courser, MD  hydrOXYzine (VISTARIL) 25 MG capsule Take 1 capsule (25 mg total) by mouth 3 (three) times daily as needed for anxiety. 11/20/18   Poulose, Bethel Born, NP  Lancets (ONETOUCH ULTRASOFT) lancets Check fingerstick blood sugars once a day before breakfast; Dx E11.9, LON 99  months 06/01/18   Arnetha Courser, MD  Melatonin 5 MG CAPS Take 5 mg by mouth daily.    [provider]  metFORMIN (GLUCOPHAGE-XR) 500 MG 24 hr tablet TAKE 1 TABLET(500 MG) BY MOUTH DAILY WITH BREAKFAST 10/22/18   Lada, Satira Anis, MD  metoprolol succinate (TOPROL-XL) 25 MG 24 hr tablet Take 1 tablet (25 mg total) by mouth daily. 09/11/18   Arnetha Courser, MD  Multiple Vitamin (MULTIVITAMIN) tablet Take 1 tablet by mouth daily.    [provider]         rosuvastatin (CRESTOR) 10 MG tablet TAKE 1 TABLET BY MOUTH THREE TIMES DAILY PER WEEK 09/28/18   Lada, Satira Anis, MD  zolpidem (AMBIEN CR) 6.25 MG CR tablet Take 3.125 mg by mouth as needed.  06/29/18   [provider]    Allergies Trazodone and nefazodone and Benicar [olmesartan]  Family History  Problem Relation Age of Onset  . Arthritis Mother   . COPD Mother   . Depression Mother   . Diabetes Mother   . Hyperlipidemia Mother   . Hypertension Mother   . Cataracts Mother   . Heart disease Mother   . Cancer Father        kidney and prostate cancer, colon cancer  . Hearing loss Father   . Kidney disease Father   . Cataracts Father   . Asthma Sister   . Cancer Paternal Grandmother   . Mental illness Sister   . Breast cancer Other     Social History Social History   Tobacco Use  . Smoking status: Former Research scientist (life sciences)  . Smokeless tobacco: Never Used  . Tobacco comment: but only for a short amount of time  Substance Use Topics  . Alcohol use: Yes    Alcohol/week: 0.0 - 1.0 standard drinks    Comment: occasional glass of wine  . Drug use: No    Review of Systems Constitutional: No fever/chills Eyes: No visual changes. ENT: No sore throat. Cardiovascular: Denies chest pain.  Occasional skipped beat. Respiratory: Denies shortness of breath. Gastrointestinal: No abdominal pain.   Genitourinary: Negative for dysuria. Musculoskeletal: Negative for back pain. Skin: Negative for rash. Neurological:  Negative for headaches, areas of focal weakness or numbness.   Does not experience any lightheadedness, weakness or feeling like she is going to pass out when the beats skipped. ____________________________________________   PHYSICAL EXAM:  VITAL SIGNS: ED Triage Vitals  Enc Vitals Group     BP 11/20/18 2131 (!) 175/90     Pulse Rate 11/20/18 2131 88     Resp 11/20/18 2131 17     Temp 11/20/18 2131 98.1 F (36.7 C)     Temp src --      SpO2 11/20/18 2131 98 %     Weight --      Height --      Head Circumference --      Peak Flow --  Pain Score 11/20/18 2129 0     Pain Loc --      Pain Edu? --      Excl. in [place]? --    Constitutional: Alert and oriented. Well appearing and in no acute distress. Eyes: Conjunctivae are normal. Head: Atraumatic. Nose: No congestion/rhinnorhea. Mouth/Throat: Mucous membranes are moist. Neck: No stridor.  Cardiovascular: Normal rate, regular rhythm.  Occasionally irregular, slight PAC noted on occasion. Grossly normal heart sounds.  Good peripheral circulation. Respiratory: Normal respiratory effort.  No retractions. Lungs CTAB. Gastrointestinal: Soft and nontender. No distention. Musculoskeletal: No lower extremity tenderness nor edema. Neurologic:  Normal speech and language. No gross focal neurologic deficits are appreciated.  Skin:  Skin is warm, dry and intact. No rash noted. Psychiatric: Mood and affect are normal. Speech and behavior are normal.  ____________________________________________   LABS (all labs ordered are listed, but only abnormal results are displayed)  Labs Reviewed  BASIC METABOLIC PANEL - Abnormal; Notable for the following components:      Result Value   Potassium 3.0 (*)    Glucose, Bld 144 (*)    All other components within normal limits  CBC - Abnormal; Notable for the following components:   RBC 5.47 (*)    MCV 75.7 (*)    MCH 25.4 (*)    All other components within normal limits  TROPONIN I    MAGNESIUM   ____________________________________________  EKG  Reviewed interpreted at 2130 Heart rate 90 QRS 80 QTc 440 Normal sinus rhythm, occasional PAC.  No evidence ischemia.  QT is normal ____________________________________________  RADIOLOGY  Dg Chest 2 View  Result Date: 11/20/2018 CLINICAL DATA:  c/o intermittent palpitations during the week. Patient reports palpitations began again tonight; declines any other symptoms EXAM: CHEST - 2 VIEW COMPARISON:  04/15/2018 FINDINGS: The heart size and mediastinal contours are within normal limits. Both lungs are clear. No pleural effusion or pneumothorax. The visualized skeletal structures are unremarkable. IMPRESSION: No active cardiopulmonary disease. Electronically Signed   By: Lajean Manes M.D.   On: 11/20/2018 21:49    ____________________________________________   PROCEDURES  Procedure(s) performed: None  Procedures  Critical Care performed: No  ____________________________________________   INITIAL IMPRESSION / ASSESSMENT AND PLAN / ED COURSE  Pertinent labs & imaging results that were available during my care of the patient were reviewed by me and considered in my medical decision making (see chart for details).   Palpitations.  Outpatient work-up with cardiology.  Today her potassium is low, likely indicative of etiology of palpitations.  Normal.  Suspect likely secondary to chlorthalidone use.  She does not exhibit any concerning symptoms associated with the palpitations such as presyncopal symptoms or chest pain.  We will supplement potassium, instructed patient to hold her chlorthalidone over the weekend and call her primary care physician.  Blood pressure 119/85 here.  Patient comfortable with plan for discharge.  Return precautions discussed. Return precautions and treatment recommendations and follow-up discussed with the patient who is agreeable with the plan.        ____________________________________________   FINAL CLINICAL IMPRESSION(S) / ED DIAGNOSES  Final diagnoses:  Palpitation  PAC (premature atrial contraction)  Hypokalemia        Note:  This document was prepared using Dragon voice recognition software and may include unintentional dictation errors       Delman Kitten, MD 11/20/18 2340

## 2018-11-20 NOTE — ED Notes (Signed)
ED Provider at bedside.  pt reports "skip beating" and recent tiredness, husband died of MI last 2023-03-23

## 2018-11-20 NOTE — ED Notes (Signed)
Denies pain, nausea, lightheadedness, dizziness. A&Ox4.

## 2018-11-20 NOTE — ED Triage Notes (Signed)
Patient c/o intermittent palpitations during the week. Patient reports palpitations began again tonight. Patient reports that her neighbor, who is a Restaurant manager, fast food checked her heart rate at home, and told her to go to the ED; patient is unsure of rate.

## 2018-11-20 NOTE — Progress Notes (Signed)
Name: Samantha Stephens   MRN: [id number]    DOB: [birth year]   Date:11/20/2018       Progress Note  Subjective  Chief Complaint  Chief Complaint  Patient presents with  . low Potassium levels    hx of low potassium - started taking some potassium pills and drank some Gatorade  . Palpitations    patient stated that she has had a few episodes of palpitations and is not sure if it is related to her potassium or if it is due to the Metformin.   . Anxiety    patient has had a lot to hit her regarding the death of her husband.  . Weight Loss    patient is concerned that she is continually losing weight even with a stable healthy diet.    HPI  Patient called yesterday due to having palpitations and fluttering in chest, had episodes previously in the summer and followed up with Dr. Clayborn Bigness. ECHO- unremarkable, noted possible paroxysmal atrial fibrillation. Had holter monitor on that didn't show anything of concern per patient.   States since her husband passed away in 2023/04/04 has been more anxious, specifically when she has anything heart related because her husband passed from heart attack. Went through grief share class that has been helpful.Patient states overall anxiety is not high just rarely having an episode a couple of times a month and it happening less often. Usually when anxiety creeps up she can deep breath and relax but notices its happening more often. She noticed the palpitations in relation to anxiety- a lot of people had been reaching out to her about her husband recently due to death of Levan Hurst.   She notes that's her potassium tends to be low when she goes to the ER for chest pains. States yesterday took a potassium supplement and was drinking Gatorade hasn't had an episode since.   She also notes people have told her she looks too thin and like she is losing weight. States initially she was because of no appetite after loss of husband but has regained some appetite and is eating  her normal amount. Discussed last weight results with her.  Wt Readings from Last 3 Encounters:  11/20/18 153 lb 12.8 oz (69.8 kg)  09/11/18 153 lb 11.2 oz (69.7 kg)  08/17/18 147 lb 14.4 oz (67.1 kg)     GAD 7 : Generalized Anxiety Score 11/20/2018 06/01/2018  Nervous, Anxious, on Edge 2 1  Control/stop worrying 1 -  Worry too much - different things 1 1  Trouble relaxing 1 3  Restless 0 1  Easily annoyed or irritable 0 0  Afraid - awful might happen 0 0  Total GAD 7 Score 5 -  Anxiety Difficulty Somewhat difficult Somewhat difficult    Depression screen [facility] 2/9 11/20/2018 09/11/2018 08/17/2018 06/01/2018 05/18/2018  Decreased Interest 0 0 0 0 0  Down, Depressed, Hopeless 2 1 0 0 0  PHQ - 2 Score 2 1 0 0 0  Altered sleeping 1 1 - 3 -  Tired, decreased energy 1 1 - 3 -  Change in appetite 0 0 - 0 -  Feeling bad or failure about yourself  0 0 - 0 -  Trouble concentrating 1 0 - 1 -  Moving slowly or fidgety/restless 0 0 - 0 -  Suicidal thoughts 0 0 - 0 -  PHQ-9 Score 5 3 - 7 -  Difficult doing work/chores Not difficult at all Not difficult at all - Somewhat  difficult -     Patient Active Problem List   Diagnosis Date Noted  . Grief reaction 06/01/2018  . Type 2 diabetes mellitus ([place]) 04/22/2018  . 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 OSA on CPAP 09/15/2017  . Hepatitis C antibody test positive 03/12/2017  . Preventative health care 03/10/2017  . Hematuria, gross 11/12/2016  . Medication monitoring encounter 09/09/2016  . Chronic hip pain, right 09/09/2016  . Numbness of right foot 09/09/2016  . Scoliosis 03/08/2016  . 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 Family hx of colon cancer requiring screening colonoscopy 03/08/2016  . Situational anxiety 10/27/2015  . Hypertension goal BP (blood pressure) < 130/80 08/18/2015  . Hyperlipidemia LDL goal <100 08/18/2015  . Insomnia, controlled 08/18/2015  . Chronic mid back pain 08/18/2015    Past Medical  History:  Diagnosis Date  . Arthritis   . Chronic mid back pain 08/18/2015  . 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 Family hx of colon cancer requiring screening colonoscopy 03/08/2016  . Headache   . Hyperlipidemia   . Hypertension   . Hypokalemia   . Palpitations   . Scoliosis 03/08/2016  . Sleep apnea   . Stress headaches     Past Surgical History:  Procedure Laterality Date  . ABDOMINAL HYSTERECTOMY    . CESAREAN SECTION     1  . COLONOSCOPY    . CYSTOSCOPY W/ RETROGRADES Bilateral 11/25/2016   Procedure: CYSTOSCOPY WITH RETROGRADE PYELOGRAM;  Surgeon: Hollice Espy, MD;  Location: [facility] ORS;  Service: Urology;  Laterality: Bilateral;  . EYE SURGERY     lasix eye surgery    Social History   Tobacco Use  . Smoking status: Former Research scientist (life sciences)  . Smokeless tobacco: Never Used  . Tobacco comment: but only for a short amount of time  Substance Use Topics  . Alcohol use: Yes    Alcohol/week: 0.0 - 1.0 standard drinks    Comment: occasional glass of wine     Current Outpatient Medications:  .  albuterol (PROVENTIL HFA;VENTOLIN HFA) 108 (90 Base) MCG/ACT inhaler, Inhale 2 puffs into the lungs every 4 (four) hours as needed for wheezing or shortness of breath. (Patient not taking: Reported on 08/17/2018), Disp: 1 Inhaler, Rfl: 1 .  aspirin EC 81 MG tablet, Take 1 tablet (81 mg total) by mouth daily., Disp: , Rfl:  .  chlorthalidone (HYGROTON) 25 MG tablet, Take 1 tablet (25 mg total) by mouth daily., Disp: 90 tablet, Rfl: 1 .  Cholecalciferol (VITAMIN D3) 2000 units TABS, Take 1 capsule by mouth daily., Disp: , Rfl:  .  estradiol (ESTRACE VAGINAL) 0.1 MG/GM vaginal cream, Place 1 Applicatorful vaginally 2 (two) times a week., Disp: 127.5 g, Rfl: 3 .  glucose blood (ONETOUCH VERIO) test strip, Check fingerstick blood sugars once a day before breakfast; Dx E11.9; LON 99 months, Disp: 100 each, Rfl: 3 .  Lancets (ONETOUCH ULTRASOFT) lancets, Check fingerstick blood sugars  once a day before breakfast; Dx E11.9, LON 99 months, Disp: 100 each, Rfl: 3 .  Melatonin 5 MG CAPS, Take 5 mg by mouth daily., Disp: , Rfl:  .  metFORMIN (GLUCOPHAGE-XR) 500 MG 24 hr tablet, TAKE 1 TABLET(500 MG) BY MOUTH DAILY WITH BREAKFAST, Disp: 90 tablet, Rfl: 1 .  metoprolol succinate (TOPROL-XL) 25 MG 24 hr tablet, Take 1 tablet (25 mg total) by mouth daily., Disp: 90 tablet, Rfl: 1 .  Multiple Vitamin (MULTIVITAMIN) tablet, Take 1 tablet by mouth daily., Disp: ,  Rfl:  .  rosuvastatin (CRESTOR) 10 MG tablet, TAKE 1 TABLET BY MOUTH THREE TIMES DAILY PER WEEK, Disp: 39 tablet, Rfl: 0 .  zolpidem (AMBIEN CR) 6.25 MG CR tablet, Take 3.125 mg by mouth as needed. , Disp: , Rfl: 2  Allergies  Allergen Reactions  . Trazodone And Nefazodone Other (See Comments)    Syncope; see in ER Feb 2018  . Benicar [Olmesartan] Cough    Review of Systems  Constitutional: Negative for chills and fever.  Eyes: Negative for blurred vision and double vision.  Respiratory: Negative for cough and shortness of breath.   Cardiovascular: Positive for palpitations. Negative for chest pain.  Neurological: Negative for dizziness, sensory change and headaches.  Psychiatric/Behavioral: Negative for depression and suicidal ideas. The patient is nervous/anxious and has insomnia.      No other specific complaints in a complete review of systems (except as listed in HPI above).  Objective  Vitals:   11/20/18 1145  BP: 122/88  Pulse: 89  Resp: 16  Temp: 98.2 F (36.8 C)  TempSrc: Oral  SpO2: 98%  Weight: 153 lb 12.8 oz (69.8 kg)  Height: 5' 4"  (1.626 m)    Body mass index is 26.4 kg/m.  Nursing Note and Vital Signs reviewed.  Physical Exam Constitutional:      Appearance: Normal appearance.  Neck:     Musculoskeletal: Normal range of motion.     Thyroid: No thyroid mass, thyromegaly or thyroid tenderness.  Cardiovascular:     Rate and Rhythm: Normal rate and regular rhythm.  Pulmonary:      Effort: Pulmonary effort is normal.     Breath sounds: Normal breath sounds.  Skin:    General: Skin is warm.  Neurological:     General: No focal deficit present.     Mental Status: She is alert and oriented to person, place, and time.  Psychiatric:        Mood and Affect: Mood normal.        Thought Content: Thought content normal.       No results found for this or any previous visit (from the past 48 hour(s)).  Assessment & Plan  1. Hypokalemia Discussed the dangers of hyperkalemia and taking too much supplementation.  Consider metformin and chlorthalidone as suspect for hypokalemia if this is a chronic issue.  Will check levels today, discussed will check again routinely.  And adjust medications as necessary. - Basic Metabolic Panel (BMET) - Magnesium  2. Palpitations Seems to be associated to anxiety as his episodes have happened as she is received anxiety inducing text from family and friends recently.  Unable to complete EKG due to machine in office and borrowed machine not working.  Through auscultation no abnormalities heard.  Discussed if recurring with dizziness, lightheadedness, shortness of breath, chest pain or if lasting for significant amount of time are becoming more frequent she should go to ER for further evaluation.  Will check electrolyte levels.  If these episodes continue despite using anxiety medications consider follow-up with cardiology. - Basic Metabolic Panel (BMET) - Magnesium  3. Anxiety Patient states she does not want to be on a daily medication feels overall her anxiety is well controlled just having acute episodes a few times a month - hydrOXYzine (VISTARIL) 25 MG capsule; Take 1 capsule (25 mg total) by mouth 3 (three) times daily as needed for anxiety.  Dispense: 30 capsule; Refill: 0

## 2018-11-21 NOTE — ED Notes (Signed)
No peripheral IV placed this visit.   Discharge instructions reviewed with patient. Questions fielded by this RN. Patient verbalizes understanding of instructions. Patient discharged home in stable condition per [facility]. No acute distress noted at time of discharge.

## 2018-11-28 LAB — BASIC METABOLIC PANEL
BUN/Creatinine Ratio: 13 (ref 9–23)
BUN: 12 mg/dL (ref 6–24)
CO2: 27 mmol/L (ref 20–29)
Calcium: 9.9 mg/dL (ref 8.7–10.2)
Chloride: 101 mmol/L (ref 96–106)
Creatinine, Ser: 0.92 mg/dL (ref 0.57–1.00)
GFR calc Af Amer: 79 mL/min/{1.73_m2} (ref >59)
GFR, EST NON AFRICAN AMERICAN: 69 mL/min/{1.73_m2} (ref >59)
Glucose: 105 mg/dL — ABNORMAL HIGH (ref 65–99)
Potassium: 4 mmol/L (ref 3.5–5.2)
Sodium: 145 mmol/L — ABNORMAL HIGH (ref 134–144)

## 2018-11-28 LAB — MAGNESIUM: Magnesium: 2 mg/dL (ref 1.6–2.3)

## 2018-12-08 ENCOUNTER — Other Ambulatory Visit: Payer: Self-pay | Admitting: Family Medicine

## 2018-12-08 DIAGNOSIS — Z1231 Encounter for screening mammogram for malignant neoplasm of breast: Secondary | ICD-10-CM

## 2018-12-10 ENCOUNTER — Emergency Department
Admission: EM | Admit: 2018-12-10 | Discharge: 2018-12-10 | Discharge status: Against Medical Advice | Payer: Managed Care, Other (non HMO) | Attending: Emergency Medicine | Admitting: Emergency Medicine

## 2018-12-10 ENCOUNTER — Other Ambulatory Visit: Payer: Self-pay

## 2018-12-10 ENCOUNTER — Emergency Department: Payer: Managed Care, Other (non HMO)

## 2018-12-10 ENCOUNTER — Encounter: Payer: Self-pay | Admitting: Emergency Medicine

## 2018-12-10 DIAGNOSIS — R002 Palpitations: Secondary | ICD-10-CM | POA: Diagnosis not present

## 2018-12-10 DIAGNOSIS — Z5321 Procedure and treatment not carried out due to patient leaving prior to being seen by health care provider: Secondary | ICD-10-CM | POA: Diagnosis not present

## 2018-12-10 LAB — CBC WITH DIFFERENTIAL/PLATELET
Abs Immature Granulocytes: 0.01 10*3/uL (ref 0.00–0.07)
BASOS PCT: 1 %
Basophils Absolute: 0.1 10*3/uL (ref 0.0–0.1)
EOS ABS: 0.5 10*3/uL (ref 0.0–0.5)
Eosinophils Relative: 6 %
HCT: 38.5 % (ref 36.0–46.0)
Hemoglobin: 13.1 g/dL (ref 12.0–15.0)
Immature Granulocytes: 0 %
LYMPHS ABS: 4.4 10*3/uL — AB (ref 0.7–4.0)
Lymphocytes Relative: 53 %
MCH: 25.7 pg — ABNORMAL LOW (ref 26.0–34.0)
MCHC: 34 g/dL (ref 30.0–36.0)
MCV: 75.5 fL — ABNORMAL LOW (ref 80.0–100.0)
Monocytes Absolute: 0.6 10*3/uL (ref 0.1–1.0)
Monocytes Relative: 7 %
Neutro Abs: 2.7 10*3/uL (ref 1.7–7.7)
Neutrophils Relative %: 33 %
Platelets: 279 10*3/uL (ref 150–400)
RBC: 5.1 MIL/uL (ref 3.87–5.11)
RDW: 13.7 % (ref 11.5–15.5)
WBC: 8.2 10*3/uL (ref 4.0–10.5)
nRBC: 0 % (ref 0.0–0.2)

## 2018-12-10 LAB — COMPREHENSIVE METABOLIC PANEL
ALT: 24 U/L (ref 0–44)
AST: 26 U/L (ref 15–41)
Albumin: 4.5 g/dL (ref 3.5–5.0)
Alkaline Phosphatase: 56 U/L (ref 38–126)
Anion gap: 11 (ref 5–15)
BUN: 14 mg/dL (ref 6–20)
CHLORIDE: 100 mmol/L (ref 98–111)
CO2: 28 mmol/L (ref 22–32)
Calcium: 9.9 mg/dL (ref 8.9–10.3)
Creatinine, Ser: 0.85 mg/dL (ref 0.44–1.00)
GFR calc Af Amer: >60 mL/min (ref >60)
GFR calc non Af Amer: >60 mL/min (ref >60)
Glucose, Bld: 119 mg/dL — ABNORMAL HIGH (ref 70–99)
POTASSIUM: 3.4 mmol/L — AB (ref 3.5–5.1)
Sodium: 139 mmol/L (ref 135–145)
Total Bilirubin: 0.6 mg/dL (ref 0.3–1.2)
Total Protein: 7.6 g/dL (ref 6.5–8.1)

## 2018-12-10 LAB — TROPONIN I: Troponin I: <0.03 ng/mL (ref <0.03)

## 2018-12-10 NOTE — ED Triage Notes (Signed)
Patient ambulatory to triage with steady gait, without difficulty or distress noted; pt st has appt with Dr Clayborn Bigness this morning; palpitations since last night accomp by [facility]; st hx of same with hypokalemia

## 2018-12-14 ENCOUNTER — Ambulatory Visit: Payer: Managed Care, Other (non HMO) | Admitting: Family Medicine

## 2018-12-14 ENCOUNTER — Encounter: Payer: Self-pay | Admitting: Family Medicine

## 2018-12-14 VITALS — BP 122/80 | HR 84 | Temp 98.6°F | Resp 12 | Ht 64.0 in | Wt 156.6 lb

## 2018-12-14 DIAGNOSIS — E119 Type 2 diabetes mellitus without complications: Secondary | ICD-10-CM | POA: Diagnosis not present

## 2018-12-14 DIAGNOSIS — E785 Hyperlipidemia, unspecified: Secondary | ICD-10-CM

## 2018-12-14 DIAGNOSIS — F4321 Adjustment disorder with depressed mood: Secondary | ICD-10-CM

## 2018-12-14 DIAGNOSIS — E876 Hypokalemia: Secondary | ICD-10-CM | POA: Diagnosis not present

## 2018-12-14 DIAGNOSIS — Z5181 Encounter for therapeutic drug level monitoring: Secondary | ICD-10-CM | POA: Diagnosis not present

## 2018-12-14 DIAGNOSIS — I10 Essential (primary) hypertension: Secondary | ICD-10-CM

## 2018-12-14 MED ORDER — PITAVASTATIN CALCIUM 2 MG PO TABS: ORAL_TABLET | ORAL | 1 refills | Status: DC

## 2018-12-14 NOTE — Patient Instructions (Addendum)
Please have labs done at [facility] contact you about the lab results Stop the current cholesterol medicine Start livalo Recheck lipids in 6 weeks

## 2018-12-14 NOTE — Progress Notes (Signed)
BP 122/80   Pulse 84   Temp 98.6 F (37 C) (Oral)   Resp 12   Ht 5' 4"  (1.626 m)   Wt 156 lb 9.6 oz (71 kg)   SpO2 99%   BMI 26.88 kg/m    Subjective:    Patient ID: Samantha Stephens, female    DOB: 11-04-1960, 58 y.o.   MRN: [id number]  HPI: TAMESHIA BONNEVILLE is a 58 y.o. female  Chief Complaint  Patient presents with  . Follow-up    ER for potassium to low    HPI  Patient is here for f/u She was in the ER for low potassium She was in the ER on Feb 20th but left and went to heart doctor instead, and again on January 31st Went to the ER at 4 am She had an appt with Dr. Clayborn Bigness that morning These palpitations are driving her crazy He stopped her chlorthalidone, dropping potassium too much He wanted to give her two BP pills holter monitor just taken off this morning Just had CMP with K+ 3.4 noted Normal magnesium 3 times in a row, 2 weeks ago and 3 weeks ago Glucose 119 She stopped the chlorthalidone, last dose on Thursday She got 5 days of potassium from ER in January; Dr. Clayborn Bigness gave her more  Type 2 diabetes; not checking FSBS with my blessing; she has a meter to check if needed   Depression screen [facility] 2/9 11/20/2018 09/11/2018 08/17/2018 06/01/2018 05/18/2018  Decreased Interest 0 0 0 0 0  Down, Depressed, Hopeless 2 1 0 0 0  PHQ - 2 Score 2 1 0 0 0  Altered sleeping 1 1 - 3 -  Tired, decreased energy 1 1 - 3 -  Change in appetite 0 0 - 0 -  Feeling bad or failure about yourself  0 0 - 0 -  Trouble concentrating 1 0 - 1 -  Moving slowly or fidgety/restless 0 0 - 0 -  Suicidal thoughts 0 0 - 0 -  PHQ-9 Score 5 3 - 7 -  Difficult doing work/chores Not difficult at all Not difficult at all - Somewhat difficult -   Fall Risk  11/20/2018 09/11/2018 08/17/2018 06/01/2018 05/18/2018  Falls in the past year? 0 0 No No No  Number falls in past yr: 0 0 - - -  Injury with Fall? 0 - - - -    Relevant past medical, surgical, family and social history reviewed Past  Medical History:  Diagnosis Date  . Arthritis   . Chronic mid back pain 08/18/2015  . 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 Family hx of colon cancer requiring screening colonoscopy 03/08/2016  . Headache   . Hyperlipidemia   . Hypertension   . Hypokalemia   . Palpitations   . Scoliosis 03/08/2016  . Sleep apnea   . Stress headaches    Past Surgical History:  Procedure Laterality Date  . ABDOMINAL HYSTERECTOMY    . CESAREAN SECTION     1  . COLONOSCOPY    . CYSTOSCOPY W/ RETROGRADES Bilateral 11/25/2016   Procedure: CYSTOSCOPY WITH RETROGRADE PYELOGRAM;  Surgeon: Hollice Espy, MD;  Location: [facility] ORS;  Service: Urology;  Laterality: Bilateral;  . EYE SURGERY     lasix eye surgery   Family History  Problem Relation Age of Onset  . Arthritis Mother   . COPD Mother   . Depression Mother   . Diabetes Mother   . Hyperlipidemia Mother   .  Hypertension Mother   . Cataracts Mother   . Heart disease Mother   . Cancer Father        kidney and prostate cancer, colon cancer  . Hearing loss Father   . Kidney disease Father   . Cataracts Father   . Asthma Sister   . Cancer Paternal Grandmother   . Mental illness Sister   . Breast cancer Other    Social History   Tobacco Use  . Smoking status: Former Research scientist (life sciences)  . Smokeless tobacco: Never Used  . Tobacco comment: but only for a short amount of time  Substance Use Topics  . Alcohol use: Yes    Alcohol/week: 0.0 - 1.0 standard drinks    Comment: occasional glass of wine  . Drug use: No     Office Visit from 11/20/2018 in [facility]  AUDIT-C Score  1      Interim medical history since last visit reviewed. Allergies and medications reviewed  Review of Systems Per HPI unless specifically indicated above     Objective:    BP 122/80   Pulse 84   Temp 98.6 F (37 C) (Oral)   Resp 12   Ht 5' 4"  (1.626 m)   Wt 156 lb 9.6 oz (71 kg)   SpO2 99%   BMI 26.88 kg/m   Wt Readings from Last  3 Encounters:  12/14/18 156 lb 9.6 oz (71 kg)  11/20/18 153 lb 12.8 oz (69.8 kg)  09/11/18 153 lb 11.2 oz (69.7 kg)    Physical Exam Constitutional:      General: She is not in acute distress.    Appearance: She is well-developed. She is not diaphoretic.  HENT:     Head: Normocephalic and atraumatic.  Eyes:     General: No scleral icterus. Neck:     Thyroid: No thyromegaly.  Cardiovascular:     Rate and Rhythm: Normal rate and regular rhythm.     Heart sounds: Normal heart sounds. No murmur.  Pulmonary:     Effort: Pulmonary effort is normal. No respiratory distress.     Breath sounds: Normal breath sounds. No wheezing.  Abdominal:     General: Bowel sounds are normal. There is no distension.     Palpations: Abdomen is soft.  Skin:    General: Skin is warm and dry.     Coloration: Skin is not pale.  Neurological:     Mental Status: She is alert.  Psychiatric:        Behavior: Behavior normal.        Thought Content: Thought content normal.        Judgment: Judgment normal.    Diabetic Foot Form - Detailed   Diabetic Foot Exam - detailed Diabetic Foot exam was performed with the following findings:  Yes 12/14/2018  4:21 AM  Visual Foot Exam completed.:  Yes  Pulse Foot Exam completed.:  Yes  Right Dorsalis Pedis:  Present Left Dorsalis Pedis:  Present  Sensory Foot Exam Completed.:  Yes Semmes-Weinstein Monofilament Test R Site 1-Great Toe:  Pos L Site 1-Great Toe:  Pos           Assessment & Plan:   Problem List Items Addressed This Visit      Cardiovascular and Mediastinum   Hypertension goal BP (blood pressure) < 130/80    Controlled; continue current regimen      Relevant Medications   Pitavastatin Calcium (LIVALO) 2 MG TABS     Endocrine  Type 2 diabetes mellitus (HCC)    Foot exam by MD; a1c every 6 months if controlled, every 3 months if not      Relevant Medications   Pitavastatin Calcium (LIVALO) 2 MG TABS   Other Relevant Orders   Basic  metabolic panel     Other   Medication monitoring encounter   Relevant Orders   ALT   Hyperlipidemia LDL goal <70 - Primary   Relevant Medications   Pitavastatin Calcium (LIVALO) 2 MG TABS   Other Relevant Orders   Lipid panel   Lipid panel   Grief reaction    Let her know I've been thinking about her and her daughter; we discussed how she is adjusting, visting her daughter from time to time       Other Visit Diagnoses    Hypokalemia       recheck potassium; her Mg2+ has been fine when K+ has been low, so don't think that's the issue; seeing cardiologist   Relevant Orders   Basic metabolic panel   Basic metabolic panel       Follow up plan: Return in about 15 weeks (around 03/29/2019) for follow-up visit with Dr. Sanda Klein.  An after-visit summary was printed and given to the patient at [facility].  Please see the patient instructions which may contain other information and recommendations beyond what is mentioned above in the assessment and plan.  Meds ordered this encounter  Medications  . Pitavastatin Calcium (LIVALO) 2 MG TABS    Sig: One by mouth every night for cholesterol; this replaces rosuvastatin1    Dispense:  30 tablet    Refill:  1    Orders Placed This Encounter  Procedures  . Basic metabolic panel  . Lipid panel  . ALT  . Basic metabolic panel  . Lipid panel

## 2018-12-16 DIAGNOSIS — E785 Hyperlipidemia, unspecified: Secondary | ICD-10-CM | POA: Insufficient documentation

## 2018-12-16 NOTE — Assessment & Plan Note (Signed)
Let her know I've been thinking about her and her daughter; we discussed how she is adjusting, visting her daughter from time to time

## 2018-12-16 NOTE — Assessment & Plan Note (Signed)
Controlled- continue current regimen.  

## 2018-12-16 NOTE — Assessment & Plan Note (Signed)
Foot exam by MD; a1c every 6 months if controlled, every 3 months if not

## 2018-12-23 ENCOUNTER — Ambulatory Visit
Admission: RE | Admit: 2018-12-23 | Discharge: 2018-12-23 | Disposition: A | Discharge status: Home / Self Care | Payer: Managed Care, Other (non HMO) | Source: Ambulatory Visit | Attending: Family Medicine | Admitting: Family Medicine

## 2018-12-23 DIAGNOSIS — Z1231 Encounter for screening mammogram for malignant neoplasm of breast: Secondary | ICD-10-CM | POA: Insufficient documentation

## 2019-01-01 ENCOUNTER — Other Ambulatory Visit: Payer: Self-pay | Admitting: Family Medicine

## 2019-01-01 LAB — BASIC METABOLIC PANEL
BUN/Creatinine Ratio: 12 (ref 9–23)
BUN: 12 mg/dL (ref 6–24)
CO2: 25 mmol/L (ref 20–29)
Calcium: 9.5 mg/dL (ref 8.7–10.2)
Chloride: 103 mmol/L (ref 96–106)
Creatinine, Ser: 1.02 mg/dL — ABNORMAL HIGH (ref 0.57–1.00)
GFR calc Af Amer: 70 mL/min/{1.73_m2} (ref >59)
GFR calc non Af Amer: 61 mL/min/{1.73_m2} (ref >59)
Glucose: 102 mg/dL — ABNORMAL HIGH (ref 65–99)
Potassium: 4.1 mmol/L (ref 3.5–5.2)
Sodium: 143 mmol/L (ref 134–144)

## 2019-01-01 LAB — LIPID PANEL
Chol/HDL Ratio: 3 ratio (ref 0.0–4.4)
Cholesterol, Total: 164 mg/dL (ref 100–199)
HDL: 55 mg/dL (ref >39)
LDL CALC: 82 mg/dL (ref 0–99)
Triglycerides: 134 mg/dL (ref 0–149)
VLDL Cholesterol Cal: 27 mg/dL (ref 5–40)

## 2019-01-01 MED ORDER — PITAVASTATIN CALCIUM 4 MG PO TABS: ORAL_TABLET | ORAL | 2 refills | Status: DC

## 2019-01-04 LAB — SPECIMEN STATUS REPORT

## 2019-01-04 LAB — ALT: ALT: 16 IU/L (ref 0–32)

## 2019-01-12 ENCOUNTER — Encounter: Payer: Self-pay | Admitting: Family Medicine

## 2019-02-03 ENCOUNTER — Encounter: Payer: Self-pay | Admitting: Family Medicine

## 2019-02-03 ENCOUNTER — Other Ambulatory Visit: Payer: Self-pay

## 2019-02-03 ENCOUNTER — Ambulatory Visit (INDEPENDENT_AMBULATORY_CARE_PROVIDER_SITE_OTHER): Payer: Managed Care, Other (non HMO) | Admitting: Family Medicine

## 2019-02-03 ENCOUNTER — Telehealth: Payer: Self-pay | Admitting: Family Medicine

## 2019-02-03 VITALS — BP 157/105 | HR 70 | Temp 95.2°F

## 2019-02-03 DIAGNOSIS — M791 Myalgia, unspecified site: Secondary | ICD-10-CM | POA: Diagnosis not present

## 2019-02-03 DIAGNOSIS — E785 Hyperlipidemia, unspecified: Secondary | ICD-10-CM

## 2019-02-03 DIAGNOSIS — E119 Type 2 diabetes mellitus without complications: Secondary | ICD-10-CM

## 2019-02-03 DIAGNOSIS — Z789 Other specified health status: Secondary | ICD-10-CM | POA: Diagnosis not present

## 2019-02-03 DIAGNOSIS — Z5181 Encounter for therapeutic drug level monitoring: Secondary | ICD-10-CM

## 2019-02-03 DIAGNOSIS — I10 Essential (primary) hypertension: Secondary | ICD-10-CM

## 2019-02-03 DIAGNOSIS — F418 Other specified anxiety disorders: Secondary | ICD-10-CM

## 2019-02-03 MED ORDER — SERTRALINE HCL 50 MG PO TABS: ORAL_TABLET | ORAL | 0 refills | Status: DC

## 2019-02-03 MED ORDER — AMLODIPINE BESYLATE 5 MG PO TABS: 5.0000 mg | ORAL_TABLET | Freq: Every day | ORAL | 3 refills | Status: DC

## 2019-02-03 NOTE — Assessment & Plan Note (Signed)
Check labs at her request at [facility], mailing orders

## 2019-02-03 NOTE — Assessment & Plan Note (Signed)
Next A1c is due on or after June 7th, but patient wants it done sooner

## 2019-02-03 NOTE — Telephone Encounter (Signed)
Please schedule patient for an appointment With whom: Dr. Sanda Klein When: 2 weeks How: VIDEO / telephone Why: blood pressure, anxiety Thank you

## 2019-02-03 NOTE — Assessment & Plan Note (Signed)
Unfortunately, she has not tolerate 3 statins

## 2019-02-03 NOTE — Assessment & Plan Note (Signed)
Start sertraline; use hydroxyzine if needed; check in two weeks from now

## 2019-02-03 NOTE — Progress Notes (Signed)
BP (!) 157/105 (BP Location: Left Arm, Patient Position: Sitting, Cuff Size: Normal)   Pulse 70   Temp (!) 95.2 F (35.1 C) (Axillary)    Subjective:    Patient ID: Samantha Stephens, female    DOB: July 09, 1961, 58 y.o.   MRN: [id number]  HPI: Samantha Stephens is a 58 y.o. female  Chief Complaint  Patient presents with  . Medication Problem    pt is still having arm soreness as discussed in her last OV, thinks its statin related.     HPI Virtual Visit via Telephone/Video Note   I connected with the patient via:  Doximity video tried first, telephone was used ultimately I verified that I am speaking with the correct person using two identifiers.  Call started: 11:41 am Call terminated: 12:07 pm Total length of call: 25 minutes 51 seconds   I discussed the limitations, risks, and privacy concerns of performing an evaluation and management service by telephone and the availability of in-person appointments. I explained that he/she may be responsible for charges related to this service. The patient expressed understanding and agreed to proceed.   Provider location: office Patient location: home, office Additional participants: no one   She says her BP has been high; this COVID-19 thing is stressing her out; her BP has been running high She is trying to stay calm and not let it get to her, but then she worries more; socially isolating; working remotely now for several weeks; she has hydroxyzine to use only if needed  Type 2 diabetes; she is aware that she is at risk for complications of [id number] if she gets it; rarely goes out to the grocery store; she has an N-95 mask and gloves when she goes to the store; she does not think she has been exposed to COVID-19  Lab Results  Component Value Date   HGBA1C 6.6 (H) 09/25/2018   Her arms are aching, she thinks it is from the statin; she has been on atorvastatin; could not tolerate rosuvastatin either; goes into shoulders, elbows,  hips, stopped the statin yesterday; she is trying to not be a hypochondriac; just this aching, wondering if something else is going on; she was worried at first that it was her heart; she has been seeing Dr. Clayborn Bigness, February and March 2020 (cardiologist); she is wondering if the anxiety is getting her heart racing; she did have a stress test in February she says; she also wore a holter monitor; she is using ultra-omega joint care supplement; she has not tried coQ10   Fall Risk  02/03/2019 11/20/2018 09/11/2018 08/17/2018 06/01/2018  Falls in the past year? 0 0 0 No No  Number falls in past yr: - 0 0 - -  Injury with Fall? 0 0 - - -    Relevant past medical, surgical, family and social history reviewed Past Medical History:  Diagnosis Date  . Arthritis   . Chronic mid back pain 08/18/2015  . 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 Family hx of colon cancer requiring screening colonoscopy 03/08/2016  . Headache   . Hyperlipidemia   . Hypertension   . Hypokalemia   . Palpitations   . Scoliosis 03/08/2016  . Sleep apnea   . Stress headaches    Past Surgical History:  Procedure Laterality Date  . ABDOMINAL HYSTERECTOMY    . CESAREAN SECTION     1  . COLONOSCOPY    . CYSTOSCOPY W/ RETROGRADES Bilateral 11/25/2016   Procedure: CYSTOSCOPY  WITH RETROGRADE PYELOGRAM;  Surgeon: Hollice Espy, MD;  Location: [facility] ORS;  Service: Urology;  Laterality: Bilateral;  . EYE SURGERY     lasix eye surgery   Family History  Problem Relation Age of Onset  . Arthritis Mother   . COPD Mother   . Depression Mother   . Diabetes Mother   . Hyperlipidemia Mother   . Hypertension Mother   . Cataracts Mother   . Heart disease Mother   . Cancer Father        kidney and prostate cancer, colon cancer  . Hearing loss Father   . Kidney disease Father   . Cataracts Father   . Asthma Sister   . Cancer Paternal Grandmother   . Mental illness Sister   . Breast cancer Other    Social History    Tobacco Use  . Smoking status: Former Research scientist (life sciences)  . Smokeless tobacco: Never Used  . Tobacco comment: but only for a short amount of time  Substance Use Topics  . Alcohol use: Yes    Alcohol/week: 0.0 - 1.0 standard drinks    Comment: occasional glass of wine  . Drug use: No     Office Visit from 02/03/2019 in [facility]  AUDIT-C Score  0      Interim medical history since last visit reviewed. Allergies and medications reviewed  Review of Systems Per HPI unless specifically indicated above     Objective:    BP (!) 157/105 (BP Location: Left Arm, Patient Position: Sitting, Cuff Size: Normal)   Pulse 70   Temp (!) 95.2 F (35.1 C) (Axillary)   Wt Readings from Last 3 Encounters:  12/14/18 156 lb 9.6 oz (71 kg)  11/20/18 153 lb 12.8 oz (69.8 kg)  09/11/18 153 lb 11.2 oz (69.7 kg)    Physical Exam Pulmonary:     Effort: No respiratory distress.  Neurological:     Mental Status: She is alert.     Cranial Nerves: No dysarthria.  Psychiatric:        Mood and Affect: Mood is not anxious or depressed.        Speech: Speech is not rapid and pressured, delayed or slurred.     Comments: Excellent historian     Results for orders placed or performed in visit on 54/27/06  Basic metabolic panel  Result Value Ref Range   Glucose 102 (H) 65 - 99 mg/dL   BUN 12 6 - 24 mg/dL   Creatinine, Ser 1.02 (H) 0.57 - 1.00 mg/dL   GFR calc non Af Amer 61 >59 mL/min/1.73   GFR calc Af Amer 70 >59 mL/min/1.73   BUN/Creatinine Ratio 12 9 - 23   Sodium 143 134 - 144 mmol/L   Potassium 4.1 3.5 - 5.2 mmol/L   Chloride 103 96 - 106 mmol/L   CO2 25 20 - 29 mmol/L   Calcium 9.5 8.7 - 10.2 mg/dL  Lipid panel  Result Value Ref Range   Cholesterol, Total 164 100 - 199 mg/dL   Triglycerides 134 0 - 149 mg/dL   HDL 55 >39 mg/dL   VLDL Cholesterol Cal 27 5 - 40 mg/dL   LDL Calculated 82 0 - 99 mg/dL   Chol/HDL Ratio 3.0 0.0 - 4.4 ratio  ALT  Result Value Ref Range   ALT  16 0 - 32 IU/L  Specimen status report  Result Value Ref Range   specimen status report Comment       Assessment &  Plan:   Problem List Items Addressed This Visit      Cardiovascular and Mediastinum   Hypertension goal BP (blood pressure) < 130/80 - Primary    Continue beta-blocker; add amlodipine; monitor at home and call me if not under 130/80; ameliorate stress, start SSRI; avoid salt, decongestants and NSAID; monitor and re-visit in 2 weeks, call if needed sooner      Relevant Medications   amLODipine (NORVASC) 5 MG tablet   Other Relevant Orders   CBC   Basic metabolic panel     Endocrine   Type 2 diabetes mellitus ([place])    Next A1c is due on or after June 7th, but patient wants it done sooner      Relevant Orders   Microalbumin / creatinine urine ratio   Hemoglobin A1c     Other   Statin intolerance   Relevant Orders   CK (Creatine Kinase)   Situational anxiety    Start sertraline; use hydroxyzine if needed; check in two weeks from now      Relevant Medications   sertraline (ZOLOFT) 50 MG tablet   Medication monitoring encounter    Check labs at her request at [facility], mailing orders      Hyperlipidemia LDL goal <70    Unfortunately, she has not tolerate 3 statins      Relevant Medications   amLODipine (NORVASC) 5 MG tablet    Other Visit Diagnoses    Myalgia       stop statin; call me in 2 weeks if persistent; I asked patient to call cardiologist to make him aware of symptoms; try CoQ10   Relevant Orders   CK (Creatine Kinase)       Follow up plan: Return in about 8 weeks (around 03/29/2019) for follow-up visit with Dr. Sanda Klein (already scheduled); 2 weeks phone/video visit.  An after-visit summary was printed and given to the patient at [place].  Please see the patient instructions which may contain other information and recommendations beyond what is mentioned above in the assessment and plan.  Meds ordered this encounter  Medications  .  amLODipine (NORVASC) 5 MG tablet    Sig: Take 1 tablet (5 mg total) by mouth daily.    Dispense:  90 tablet    Refill:  3  . sertraline (ZOLOFT) 50 MG tablet    Sig: One-half of a pill by mouth every morning for 8 days, then one whole pill    Dispense:  90 tablet    Refill:  0    Orders Placed This Encounter  Procedures  . Microalbumin / creatinine urine ratio  . Hemoglobin A1c  . CBC  . CK (Creatine Kinase)  . Basic metabolic panel

## 2019-02-03 NOTE — Assessment & Plan Note (Addendum)
Continue beta-blocker; add amlodipine; monitor at home and call me if not under 130/80; ameliorate stress, start SSRI; avoid salt, decongestants and NSAID; monitor and re-visit in 2 weeks, call if needed sooner

## 2019-02-03 NOTE — Telephone Encounter (Signed)
done

## 2019-02-17 ENCOUNTER — Other Ambulatory Visit: Payer: Self-pay

## 2019-02-17 ENCOUNTER — Ambulatory Visit (INDEPENDENT_AMBULATORY_CARE_PROVIDER_SITE_OTHER): Payer: Managed Care, Other (non HMO) | Admitting: Nurse Practitioner

## 2019-02-17 ENCOUNTER — Ambulatory Visit: Payer: Managed Care, Other (non HMO) | Admitting: Family Medicine

## 2019-02-17 ENCOUNTER — Encounter: Payer: Self-pay | Admitting: Nurse Practitioner

## 2019-02-17 VITALS — BP 119/78 | HR 69 | Ht 64.0 in | Wt 151.6 lb

## 2019-02-17 DIAGNOSIS — M791 Myalgia, unspecified site: Secondary | ICD-10-CM | POA: Diagnosis not present

## 2019-02-17 DIAGNOSIS — F419 Anxiety disorder, unspecified: Secondary | ICD-10-CM | POA: Diagnosis not present

## 2019-02-17 DIAGNOSIS — R8290 Unspecified abnormal findings in urine: Secondary | ICD-10-CM

## 2019-02-17 DIAGNOSIS — I10 Essential (primary) hypertension: Secondary | ICD-10-CM

## 2019-02-17 NOTE — Progress Notes (Signed)
Virtual Visit via Video Note  I connected with Samantha Stephens on 02/17/19 at  8:40 AM EDT by a video enabled telemedicine application and verified that I am speaking with the correct person using two identifiers.   Staff discussed the limitations of evaluation and management by telemedicine and the availability of in person appointments. The patient expressed understanding and agreed to proceed.  Patient location: home  My location: home office Other people present: none  HPI  Patient presents for 2 week follow up on blood pressure and anxiety. Has had increased anxiety stress due to pandemic. She was started on amlodipine 48m and zoloft  545mdaily.   Patient endorses notes distinct odor in genitals is concerned she has a UTI. Denies urinary frequency, lower back pain or dysuria. Denies vaginal discharge, itching or pain.   Hypertension Patient was additionally prescribed metoprolol 2579maily but she had stopped it when amlodipine was added because she thought it was a replacement medication.  States later in the day her BP does get a little higher 130/90's mid day.  Denies headaches, dizziness, chest pain, blurry vision; states feels much better now that BP is better controlled.  BP Readings from Last 3 Encounters:  02/17/19 119/78  02/03/19 (!) 157/105  12/14/18 122/80    Anxiety Prescribed zoloft 63m36mily and hydroxyzine 25mg55m.  She doesn't like taking medications so she has been going outside more and taking walks, the weather is nicer and she feels overall her anxiety is much improved. Was doing grief share but with pandemic everything has shut down. Had a once a month womens group- going to switch to zoom.  Work has been [employer] : Generalized Anxiety Score 02/17/2019 11/20/2018 06/01/2018  Nervous, Anxious, on Edge 2 2 1   Control/stop worrying 2 1 -  Worry too much - different things 2 1 1   Trouble relaxing 2 1 3   Restless 2 0 1  Easily annoyed or irritable 1 0 0   Afraid - awful might happen 2 0 0  Total GAD 7 Score 13 5 -  Anxiety Difficulty Not difficult at all Somewhat difficult Somewhat difficult    Myalgias Patient was taken off of atorvastatin last time states she can move her arms much better and feel better overall but has some mild stiffness and pain still but has full ROM.  PHQ2/9: Depression screen PHQ [facility] 11/20/2018 09/11/2018 08/17/2018 06/01/2018  Decreased Interest 0 0 0 0 0  Down, Depressed, Hopeless 0 2 1 0 0  PHQ - 2 Score 0 2 1 0 0  Altered sleeping 0 1 1 - 3  Tired, decreased energy 0 1 1 - 3  Change in appetite 0 0 0 - 0  Feeling bad or failure about yourself  0 0 0 - 0  Trouble concentrating 0 1 0 - 1  Moving slowly or fidgety/restless 0 0 0 - 0  Suicidal thoughts 0 0 0 - 0  PHQ-9 Score 0 5 3 - 7  Difficult doing work/chores Not difficult at all Not difficult at all Not difficult at all - Somewhat difficult    PHQ reviewed. Negative  Patient Active Problem List   Diagnosis Date Noted  . Statin intolerance 02/03/2019  . Hyperlipidemia LDL goal <70 12/16/2018  . Grief reaction 06/01/2018  . Type 2 diabetes mellitus ([facility]) [place]03/2019  . 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 OSA on CPAP 09/15/2017  . Hepatitis C antibody test positive 03/12/2017  . Preventative health  care 03/10/2017  . Hematuria, gross 11/12/2016  . Medication monitoring encounter 09/09/2016  . Chronic hip pain, right 09/09/2016  . Numbness of right foot 09/09/2016  . Scoliosis 03/08/2016  . 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 Family hx of colon cancer requiring screening colonoscopy 03/08/2016  . Situational anxiety 10/27/2015  . Hypertension goal BP (blood pressure) < 130/80 08/18/2015  . Insomnia, controlled 08/18/2015  . Chronic mid back pain 08/18/2015    Past Medical History:  Diagnosis Date  . Arthritis   . Chronic mid back pain 08/18/2015  . 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 Family hx  of colon cancer requiring screening colonoscopy 03/08/2016  . Headache   . Hyperlipidemia   . Hypertension   . Hypokalemia   . Palpitations   . Scoliosis 03/08/2016  . Sleep apnea   . Stress headaches     Past Surgical History:  Procedure Laterality Date  . ABDOMINAL HYSTERECTOMY    . CESAREAN SECTION     1  . COLONOSCOPY    . CYSTOSCOPY W/ RETROGRADES Bilateral 11/25/2016   Procedure: CYSTOSCOPY WITH RETROGRADE PYELOGRAM;  Surgeon: Hollice Espy, MD;  Location: [facility] ORS;  Service: Urology;  Laterality: Bilateral;  . EYE SURGERY     lasix eye surgery    Social History   Tobacco Use  . Smoking status: Former Research scientist (life sciences)  . Smokeless tobacco: Never Used  . Tobacco comment: but only for a short amount of time  Substance Use Topics  . Alcohol use: Yes    Alcohol/week: 0.0 - 1.0 standard drinks    Comment: occasional glass of wine     Current Outpatient Medications:  .  amLODipine (NORVASC) 5 MG tablet, Take 1 tablet (5 mg total) by mouth daily., Disp: 90 tablet, Rfl: 3 .  aspirin EC 81 MG tablet, Take 1 tablet (81 mg total) by mouth daily., Disp: , Rfl:  .  Cholecalciferol (VITAMIN D3) 2000 units TABS, Take 1 capsule by mouth daily., Disp: , Rfl:  .  estradiol (ESTRACE VAGINAL) 0.1 MG/GM vaginal cream, Place 1 Applicatorful vaginally 2 (two) times a week., Disp: 127.5 g, Rfl: 3 .  metFORMIN (GLUCOPHAGE-XR) 500 MG 24 hr tablet, TAKE 1 TABLET(500 MG) BY MOUTH DAILY WITH BREAKFAST, Disp: 90 tablet, Rfl: 1 .  Multiple Vitamin (MULTIVITAMIN) tablet, Take 1 tablet by mouth daily., Disp: , Rfl:  .  potassium chloride (K-DUR) 10 MEQ tablet, Take 1 tablet (10 mEq total) by mouth daily., Disp: 5 tablet, Rfl: 0 .  sertraline (ZOLOFT) 50 MG tablet, One-half of a pill by mouth every morning for 8 days, then one whole pill, Disp: 90 tablet, Rfl: 0 .  zolpidem (AMBIEN CR) 6.25 MG CR tablet, Take 3.125 mg by mouth as needed. , Disp: , Rfl: 2 .  albuterol (PROVENTIL HFA;VENTOLIN HFA) 108 (90 Base)  MCG/ACT inhaler, Inhale 2 puffs into the lungs every 4 (four) hours as needed for wheezing or shortness of breath. (Patient not taking: Reported on 02/03/2019), Disp: 1 Inhaler, Rfl: 1 .  glucose blood (ONETOUCH VERIO) test strip, Check fingerstick blood sugars once a day before breakfast; Dx E11.9; LON 99 months, Disp: 100 each, Rfl: 3 .  hydrOXYzine (VISTARIL) 25 MG capsule, Take 1 capsule (25 mg total) by mouth 3 (three) times daily as needed for anxiety. (Patient not taking: Reported on 02/03/2019), Disp: 30 capsule, Rfl: 0 .  Lancets (ONETOUCH ULTRASOFT) lancets, Check fingerstick blood sugars once a day before breakfast; Dx E11.9, LON 99  months, Disp: 100 each, Rfl: 3 .  Melatonin 5 MG CAPS, Take 5 mg by mouth daily., Disp: , Rfl:  .  metoprolol succinate (TOPROL-XL) 25 MG 24 hr tablet, Take 1 tablet (25 mg total) by mouth daily. (Patient not taking: Reported on 02/17/2019), Disp: 90 tablet, Rfl: 1  Allergies  Allergen Reactions  . Trazodone And Nefazodone Other (See Comments)    Syncope; see in ER Feb 2018  . Benicar [Olmesartan] Cough    ROS   No other specific complaints in a complete review of systems (except as listed in HPI above).  Objective  Vitals:   02/17/19 0827  BP: 119/78  Pulse: 69  Weight: 151 lb 9.6 oz (68.8 kg)  Height: 5' 4"  (1.626 m)     Body mass index is 26.02 kg/m.  Nursing Note and Vital Signs reviewed.  Physical Exam  Constitutional: Patient appears well-developed and well-nourished. No distress.  HENT: Head: Normocephalic and atraumatic. Cardiovascular: Normal rate Pulmonary/Chest: Effort normal  Musculoskeletal: Normal range of motion,  Neurological: he is alert and oriented to person, place, and time. speech and gait are normal.  Skin: No rash noted. No erythema.  Psychiatric: Patient has a normal mood and affect. behavior is normal. Judgment and thought content normal.    Assessment & Plan  1. Hypertension goal BP (blood pressure) <  130/80 She stopped her metoprolol for 2 weeks and her BP is at goal we will continue to disconttinue, consider adding it back if BP becomes elevated again   2. Anxiety Discussed in detail, identified community support, explained medications differences and encouraged journaling.   3. Myalgia Much improved since off statin per patients self report   4. Foul smelling urine Will add on, if foul smell from genitals is still present with drinking plenty of water or she develops vaginal discharge will come in for self swab collect and another appointment  - Urinalysis, Routine w reflex microscopic; Future    Follow Up Instructions:   Has routine scheduled   I discussed the assessment and treatment plan with the patient. The patient was provided an opportunity to ask questions and all were answered. The patient agreed with the plan and demonstrated an understanding of the instructions.   The patient was advised to call back or seek an in-person evaluation if the symptoms worsen or if the condition fails to improve as anticipated.  I provided 103mnutes of non-face-to-face time during this encounter. 18 minutes on video and additional time in chart review.    EFredderick Severance NP

## 2019-02-19 ENCOUNTER — Other Ambulatory Visit: Payer: Self-pay

## 2019-02-19 ENCOUNTER — Encounter: Payer: Self-pay | Admitting: Emergency Medicine

## 2019-02-19 DIAGNOSIS — I10 Essential (primary) hypertension: Secondary | ICD-10-CM | POA: Insufficient documentation

## 2019-02-19 DIAGNOSIS — R0602 Shortness of breath: Secondary | ICD-10-CM | POA: Diagnosis present

## 2019-02-19 DIAGNOSIS — E119 Type 2 diabetes mellitus without complications: Secondary | ICD-10-CM | POA: Diagnosis not present

## 2019-02-19 DIAGNOSIS — Z87891 Personal history of nicotine dependence: Secondary | ICD-10-CM | POA: Insufficient documentation

## 2019-02-19 DIAGNOSIS — Z7984 Long term (current) use of oral hypoglycemic drugs: Secondary | ICD-10-CM | POA: Insufficient documentation

## 2019-02-19 DIAGNOSIS — Z79899 Other long term (current) drug therapy: Secondary | ICD-10-CM | POA: Insufficient documentation

## 2019-02-19 DIAGNOSIS — F419 Anxiety disorder, unspecified: Secondary | ICD-10-CM | POA: Diagnosis not present

## 2019-02-19 DIAGNOSIS — Z7982 Long term (current) use of aspirin: Secondary | ICD-10-CM | POA: Diagnosis not present

## 2019-02-19 NOTE — ED Triage Notes (Signed)
Pt arrives ambulatory to triage with c/o SOB today. Pt states that she became anxious today when her BP read high and she felt like her heart was racing. Pt states that this has happened x 3 today. Pt is talking in full sentences without distress and has clear lung sounds throughout all quadrants. Pt is in NAD.

## 2019-02-20 ENCOUNTER — Emergency Department
Admission: EM | Admit: 2019-02-20 | Discharge: 2019-02-20 | Disposition: A | Discharge status: Home / Self Care | Payer: Managed Care, Other (non HMO) | Attending: Emergency Medicine | Admitting: Emergency Medicine

## 2019-02-20 ENCOUNTER — Emergency Department: Payer: Managed Care, Other (non HMO)

## 2019-02-20 DIAGNOSIS — I10 Essential (primary) hypertension: Secondary | ICD-10-CM

## 2019-02-20 DIAGNOSIS — F419 Anxiety disorder, unspecified: Secondary | ICD-10-CM

## 2019-02-20 LAB — URINALYSIS, COMPLETE (UACMP) WITH MICROSCOPIC
Bacteria, UA: NONE SEEN
Bilirubin Urine: NEGATIVE
Glucose, UA: NEGATIVE mg/dL
Ketones, ur: NEGATIVE mg/dL
Leukocytes,Ua: NEGATIVE
Nitrite: NEGATIVE
Protein, ur: NEGATIVE mg/dL
Specific Gravity, Urine: 1.01 (ref 1.005–1.030)
pH: 6 (ref 5.0–8.0)

## 2019-02-20 LAB — COMPREHENSIVE METABOLIC PANEL
ALT: 22 U/L (ref 0–44)
AST: 24 U/L (ref 15–41)
Albumin: 4.7 g/dL (ref 3.5–5.0)
Alkaline Phosphatase: 70 U/L (ref 38–126)
Anion gap: 7 (ref 5–15)
BUN: 15 mg/dL (ref 6–20)
CO2: 27 mmol/L (ref 22–32)
Calcium: 9.4 mg/dL (ref 8.9–10.3)
Chloride: 106 mmol/L (ref 98–111)
Creatinine, Ser: 0.83 mg/dL (ref 0.44–1.00)
GFR calc Af Amer: >60 mL/min (ref >60)
GFR calc non Af Amer: >60 mL/min (ref >60)
Glucose, Bld: 136 mg/dL — ABNORMAL HIGH (ref 70–99)
Potassium: 3.8 mmol/L (ref 3.5–5.1)
Sodium: 140 mmol/L (ref 135–145)
Total Bilirubin: 0.4 mg/dL (ref 0.3–1.2)
Total Protein: 8.1 g/dL (ref 6.5–8.1)

## 2019-02-20 LAB — CBC WITH DIFFERENTIAL/PLATELET
Abs Immature Granulocytes: 0.02 10*3/uL (ref 0.00–0.07)
Basophils Absolute: 0.1 10*3/uL (ref 0.0–0.1)
Basophils Relative: 1 %
Eosinophils Absolute: 0.1 10*3/uL (ref 0.0–0.5)
Eosinophils Relative: 1 %
HCT: 40.8 % (ref 36.0–46.0)
Hemoglobin: 13.8 g/dL (ref 12.0–15.0)
Immature Granulocytes: 0 %
Lymphocytes Relative: 24 %
Lymphs Abs: 2.2 10*3/uL (ref 0.7–4.0)
MCH: 25.4 pg — ABNORMAL LOW (ref 26.0–34.0)
MCHC: 33.8 g/dL (ref 30.0–36.0)
MCV: 75 fL — ABNORMAL LOW (ref 80.0–100.0)
Monocytes Absolute: 0.3 10*3/uL (ref 0.1–1.0)
Monocytes Relative: 3 %
Neutro Abs: 6.4 10*3/uL (ref 1.7–7.7)
Neutrophils Relative %: 71 %
Platelets: 301 10*3/uL (ref 150–400)
RBC: 5.44 MIL/uL — ABNORMAL HIGH (ref 3.87–5.11)
RDW: 14 % (ref 11.5–15.5)
WBC: 9.1 10*3/uL (ref 4.0–10.5)
nRBC: 0 % (ref 0.0–0.2)

## 2019-02-20 LAB — TSH: TSH: 1.858 u[IU]/mL (ref 0.350–4.500)

## 2019-02-20 LAB — T4, FREE: Free T4: 0.72 ng/dL — ABNORMAL LOW (ref 0.82–1.77)

## 2019-02-20 LAB — TROPONIN I: Troponin I: <0.03 ng/mL (ref <0.03)

## 2019-02-20 MED ORDER — LORAZEPAM 2 MG/ML IJ SOLN
0.5000 mg | Freq: Once | INTRAMUSCULAR | Status: AC
  Administered 2019-02-20: 01:00:00 0.5 mg via INTRAVENOUS
  Filled 2019-02-20: qty 1

## 2019-02-20 MED ORDER — CLONIDINE HCL 0.1 MG PO TABS
0.1000 mg | ORAL_TABLET | Freq: Once | ORAL | Status: AC
  Administered 2019-02-20: 01:00:00 0.1 mg via ORAL
  Filled 2019-02-20: qty 1

## 2019-02-20 MED ORDER — LORAZEPAM 1 MG PO TABS: 1.0000 mg | ORAL_TABLET | Freq: Three times a day (TID) | ORAL | 0 refills | Status: DC | PRN

## 2019-02-20 NOTE — ED Provider Notes (Signed)
[facility] Emergency Department Provider Note   ____________________________________________   First MD Initiated Contact with Patient 02/20/19 0106     (approximate)  I have reviewed the triage vital signs and the nursing notes.   HISTORY  Chief Complaint Anxiety and Shortness of Breath    HPI Samantha Stephens is a 58 y.o. female who presents to the ED from home with a chief complaint of elevated blood pressure, shortness of breath and anxiety.  Patient states her PCP switched her from metoprolol to amlodipine 5 mg 2 weeks ago.  Tonight she felt flushed and took her blood pressure which was elevated.  Began to feel anxious with heart racing.  Took an extra half of amlodipine as well as hydroxyzine which she took for the first time.  This is also surrounding the anniversary of her husband's death.  Patient states she already feels better being in the ED.  Denies recent fever, cough, chest pain, abdominal pain, nausea or vomiting.  Denies recent travel, trauma or exposure to persons diagnosed with coronavirus.  She is prescribed estradiol vaginal cream but does not use it.        Past Medical History:  Diagnosis Date  . Arthritis   . Chronic mid back pain 08/18/2015  . 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 Family hx of colon cancer requiring screening colonoscopy 03/08/2016  . Headache   . Hyperlipidemia   . Hypertension   . Hypokalemia   . Palpitations   . Scoliosis 03/08/2016  . Sleep apnea   . Stress headaches     Patient Active Problem List   Diagnosis Date Noted  . Statin intolerance 02/03/2019  . Hyperlipidemia LDL goal <70 12/16/2018  . Grief reaction 06/01/2018  . Type 2 diabetes mellitus ([place]) 04/22/2018  . 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 OSA on CPAP 09/15/2017  . Hepatitis C antibody test positive 03/12/2017  . Preventative health care 03/10/2017  . Hematuria, gross 11/12/2016  . Medication monitoring  encounter 09/09/2016  . Chronic hip pain, right 09/09/2016  . Numbness of right foot 09/09/2016  . Scoliosis 03/08/2016  . 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 Family hx of colon cancer requiring screening colonoscopy 03/08/2016  . Situational anxiety 10/27/2015  . Hypertension goal BP (blood pressure) < 130/80 08/18/2015  . Insomnia, controlled 08/18/2015  . Chronic mid back pain 08/18/2015    Past Surgical History:  Procedure Laterality Date  . ABDOMINAL HYSTERECTOMY    . CESAREAN SECTION     1  . COLONOSCOPY    . CYSTOSCOPY W/ RETROGRADES Bilateral 11/25/2016   Procedure: CYSTOSCOPY WITH RETROGRADE PYELOGRAM;  Surgeon: Hollice Espy, MD;  Location: [facility] ORS;  Service: Urology;  Laterality: Bilateral;  . EYE SURGERY     lasix eye surgery    Prior to Admission medications   Medication Sig Start Date End Date Taking? Authorizing Provider  albuterol (PROVENTIL HFA;VENTOLIN HFA) 108 (90 Base) MCG/ACT inhaler Inhale 2 puffs into the lungs every 4 (four) hours as needed for wheezing or shortness of breath. Patient not taking: Reported on 02/03/2019 12/04/16   Arnetha Courser, MD  amLODipine (NORVASC) 5 MG tablet Take 1 tablet (5 mg total) by mouth daily. 02/03/19   Arnetha Courser, MD  aspirin EC 81 MG tablet Take 1 tablet (81 mg total) by mouth daily. 09/09/16   Arnetha Courser, MD  Cholecalciferol (VITAMIN D3) 2000 units TABS Take 1 capsule by mouth daily.  [provider]  estradiol (ESTRACE VAGINAL) 0.1 MG/GM vaginal cream Place 1 Applicatorful vaginally 2 (two) times a week. 04/05/16   Arnetha Courser, MD  glucose blood (ONETOUCH VERIO) test strip Check fingerstick blood sugars once a day before breakfast; Dx E11.9; LON 99 months 06/01/18   Arnetha Courser, MD  hydrOXYzine (VISTARIL) 25 MG capsule Take 1 capsule (25 mg total) by mouth 3 (three) times daily as needed for anxiety. Patient not taking: Reported on 02/03/2019 11/20/18   Fredderick Severance, NP   Lancets [facility] ULTRASOFT) lancets Check fingerstick blood sugars once a day before breakfast; Dx E11.9, LON 99 months 06/01/18   Arnetha Courser, MD  Melatonin 5 MG CAPS Take 5 mg by mouth daily.    [provider]  metFORMIN (GLUCOPHAGE-XR) 500 MG 24 hr tablet TAKE 1 TABLET(500 MG) BY MOUTH DAILY WITH BREAKFAST 10/22/18   Lada, Satira Anis, MD  metoprolol succinate (TOPROL-XL) 25 MG 24 hr tablet Take 1 tablet (25 mg total) by mouth daily. Patient not taking: Reported on 02/17/2019 09/11/18   Arnetha Courser, MD  Multiple Vitamin (MULTIVITAMIN) tablet Take 1 tablet by mouth daily.    [provider]  potassium chloride (K-DUR) 10 MEQ tablet Take 1 tablet (10 mEq total) by mouth daily. 11/20/18   Delman Kitten, MD  sertraline (ZOLOFT) 50 MG tablet One-half of a pill by mouth every morning for 8 days, then one whole pill 02/03/19   Lada, Satira Anis, MD  zolpidem (AMBIEN CR) 6.25 MG CR tablet Take 3.125 mg by mouth as needed.  06/29/18   [provider]    Allergies Trazodone and nefazodone and Benicar [olmesartan]  Family History  Problem Relation Age of Onset  . Arthritis Mother   . COPD Mother   . Depression Mother   . Diabetes Mother   . Hyperlipidemia Mother   . Hypertension Mother   . Cataracts Mother   . Heart disease Mother   . Cancer Father        kidney and prostate cancer, colon cancer  . Hearing loss Father   . Kidney disease Father   . Cataracts Father   . Asthma Sister   . Cancer Paternal Grandmother   . Mental illness Sister   . Breast cancer Other     Social History Social History   Tobacco Use  . Smoking status: Former Research scientist (life sciences)  . Smokeless tobacco: Never Used  . Tobacco comment: but only for a short amount of time  Substance Use Topics  . Alcohol use: Yes    Alcohol/week: 0.0 - 1.0 standard drinks    Comment: occasional glass of wine  . Drug use: No    Review of Systems  Constitutional: No fever/chills Eyes: No visual changes. ENT:  No sore throat. Cardiovascular: Positive for palpitations.  Denies chest pain. Respiratory: Positive for shortness of breath. Gastrointestinal: No abdominal pain.  No nausea, no vomiting.  No diarrhea.  No constipation. Genitourinary: Negative for dysuria. Musculoskeletal: Negative for back pain. Skin: Negative for rash. Neurological: Negative for headaches, focal weakness or numbness. Psychiatric:  Positive for anxiety.  ____________________________________________   PHYSICAL EXAM:  VITAL SIGNS: ED Triage Vitals  Enc Vitals Group     BP 02/19/19 2242 (!) 170/99     Pulse Rate 02/19/19 2242 94     Resp 02/19/19 2242 20     Temp 02/19/19 2242 98.5 F (36.9 C)     Temp Source 02/19/19 2242 Oral  SpO2 02/19/19 2242 94 %     Weight 02/19/19 2243 152 lb (68.9 kg)     Height 02/19/19 2243 5' 4"  (1.626 m)     Head Circumference --      Peak Flow --      Pain Score 02/19/19 2251 0     Pain Loc --      Pain Edu? --      Excl. in [place]? --     Constitutional: Alert and oriented. Well appearing and in no acute distress. Eyes: Conjunctivae are normal. PERRL. EOMI. Head: Atraumatic. Nose: No congestion/rhinnorhea. Mouth/Throat: Mucous membranes are moist.  Oropharynx non-erythematous. Neck: No stridor.  No thyromegaly. Cardiovascular: Normal rate, regular rhythm. Grossly normal heart sounds.  Good peripheral circulation. Respiratory: Normal respiratory effort.  No retractions. Lungs CTAB. Gastrointestinal: Soft and nontender. No distention. No abdominal bruits. No CVA tenderness. Musculoskeletal: No lower extremity tenderness nor edema.  No joint effusions. Neurologic:  Normal speech and language. No gross focal neurologic deficits are appreciated. No gait instability. Skin:  Skin is warm, dry and intact. No rash noted. Psychiatric: Mood and affect are normal. Speech and behavior are normal.  ____________________________________________   LABS (all labs ordered are listed, but  only abnormal results are displayed)  Labs Reviewed  CBC WITH DIFFERENTIAL/PLATELET - Abnormal; Notable for the following components:      Result Value   RBC 5.44 (*)    MCV 75.0 (*)    MCH 25.4 (*)    All other components within normal limits  COMPREHENSIVE METABOLIC PANEL - Abnormal; Notable for the following components:   Glucose, Bld 136 (*)    All other components within normal limits  URINALYSIS, COMPLETE (UACMP) WITH MICROSCOPIC - Abnormal; Notable for the following components:   Color, Urine STRAW (*)    APPearance CLEAR (*)    Hgb urine dipstick MODERATE (*)    All other components within normal limits  T4, FREE - Abnormal; Notable for the following components:   Free T4 0.72 (*)    All other components within normal limits  TROPONIN I  TSH   ____________________________________________  EKG  ED ECG REPORT I, , J, the attending physician, personally viewed and interpreted this ECG.   Date: 02/20/2019  EKG Time: 2247  Rate: 88  Rhythm: normal EKG, normal sinus rhythm  Axis: Normal  Intervals:none  ST&T Change: Nonspecific  ____________________________________________  RADIOLOGY  ED MD interpretation: No acute cardiopulmonary process  Official radiology report(s): Dg Chest 2 View  Result Date: 02/20/2019 CLINICAL DATA:  Shortness of breath EXAM: CHEST - 2 VIEW COMPARISON:  12/10/2018 FINDINGS: Heart and mediastinal contours are within normal limits. No focal opacities or effusions. No acute bony abnormality. IMPRESSION: No active cardiopulmonary disease. Electronically Signed   By: Rolm Baptise M.D.   On: 02/20/2019 00:49    ____________________________________________   PROCEDURES  Procedure(s) performed (including Critical Care):  Procedures  CRITICAL CARE Performed by: Paulette Blanch   Total critical care time: 30 minutes  Critical care time was exclusive of separately billable procedures and treating other patients.  Critical care was  necessary to treat or prevent imminent or life-threatening deterioration.  Critical care was time spent personally by me on the following activities: development of treatment plan with patient and/or surrogate as well as nursing, discussions with consultants, evaluation of patient's response to treatment, examination of patient, obtaining history from patient or surrogate, ordering and performing treatments and interventions, ordering and review of laboratory studies, ordering and review  of radiographic studies, pulse oximetry and re-evaluation of patient's condition. ____________________________________________   INITIAL IMPRESSION / ASSESSMENT AND PLAN / ED COURSE  As part of my medical decision making, I reviewed the following data within the [place] notes reviewed and incorporated, Labs reviewed, EKG interpreted, Old chart reviewed and Notes from prior ED visits     Jonetta P Castiglia was evaluated in Emergency Department on 02/20/2019 for the symptoms described in the history of present illness. She was evaluated in the context of the global COVID-19 pandemic, which necessitated consideration that the patient might be at risk for infection with the SARS-CoV-2 virus that causes COVID-19. Institutional protocols and algorithms that pertain to the evaluation of patients at risk for COVID-19 are in a state of rapid change based on information released by regulatory bodies including the CDC and federal and state organizations. These policies and algorithms were followed during the patient's care in the ED.   58 year old female with hypertension who presents with elevated blood pressure, anxiety, palpitations and shortness of breath. Differential includes, but is not limited to, viral syndrome, bronchitis including COPD exacerbation, pneumonia, reactive airway disease including asthma, CHF including exacerbation with or without pulmonary/interstitial edema, pneumothorax, ACS,  thoracic trauma, and pulmonary embolism.  Chest x-ray is unremarkable.  Will obtain cardiac blood work and urinalysis.  Administer oral Clonidine for elevated blood pressure and IV Ativan for anxiety.  Reassess.  Clinical Course as of Feb 20 219  Sat Feb 20, 2019  0219 BP 127/86.  Updated patient on all test results.  No complaints currently.  Will discharge home with limited prescription for Ativan to use as needed for anxiety.  She will follow-up closely with her PCP next week.  Strict return precautions given.  Patient verbalizes understanding and agrees with plan of care.   [JS]    Clinical Course User Index [JS] Paulette Blanch, MD     ____________________________________________   FINAL CLINICAL IMPRESSION(S) / ED DIAGNOSES  Final diagnoses:  Anxiety  Essential hypertension     ED Discharge Orders    None       Note:  This document was prepared using Dragon voice recognition software and may include unintentional dictation errors.   Paulette Blanch, MD 02/20/19 [phone]

## 2019-02-20 NOTE — Discharge Instructions (Signed)
Keep a log of your blood pressures morning, noon and evening and take this to your doctor. You may take Ativan as needed for anxiety. 3.  Return to the ER for worsening symptoms, persistent vomiting, difficulty breathing or other concerns.

## 2019-02-22 IMAGING — CR DG CHEST 2V
2 series · 2 of 2 positions shown · non-contrast
Comparison: 08/19/2017

CLINICAL DATA: Chest and left arm pain for 1 day.  Hypertension.

EXAM:
CHEST - 2 VIEW

[chest pa]
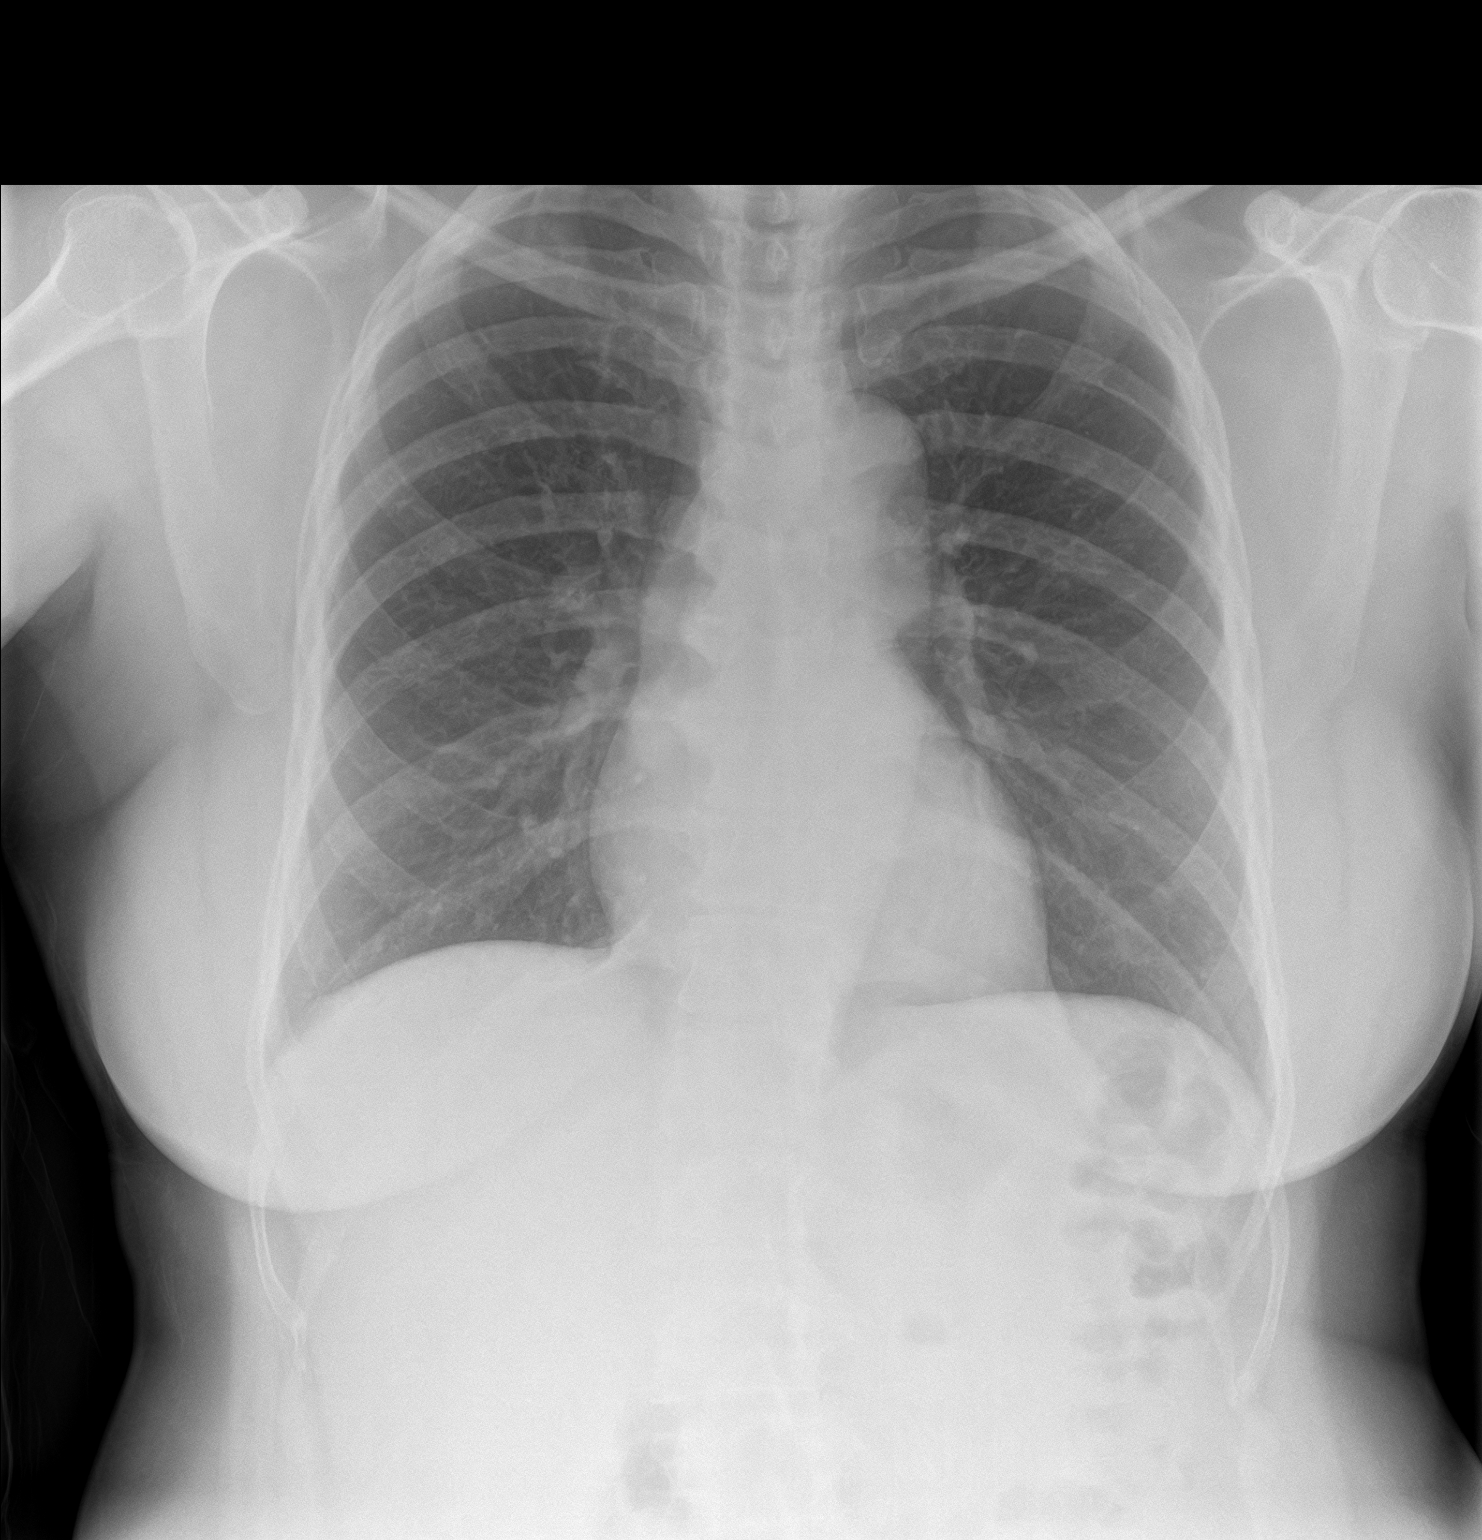

[chest lat]
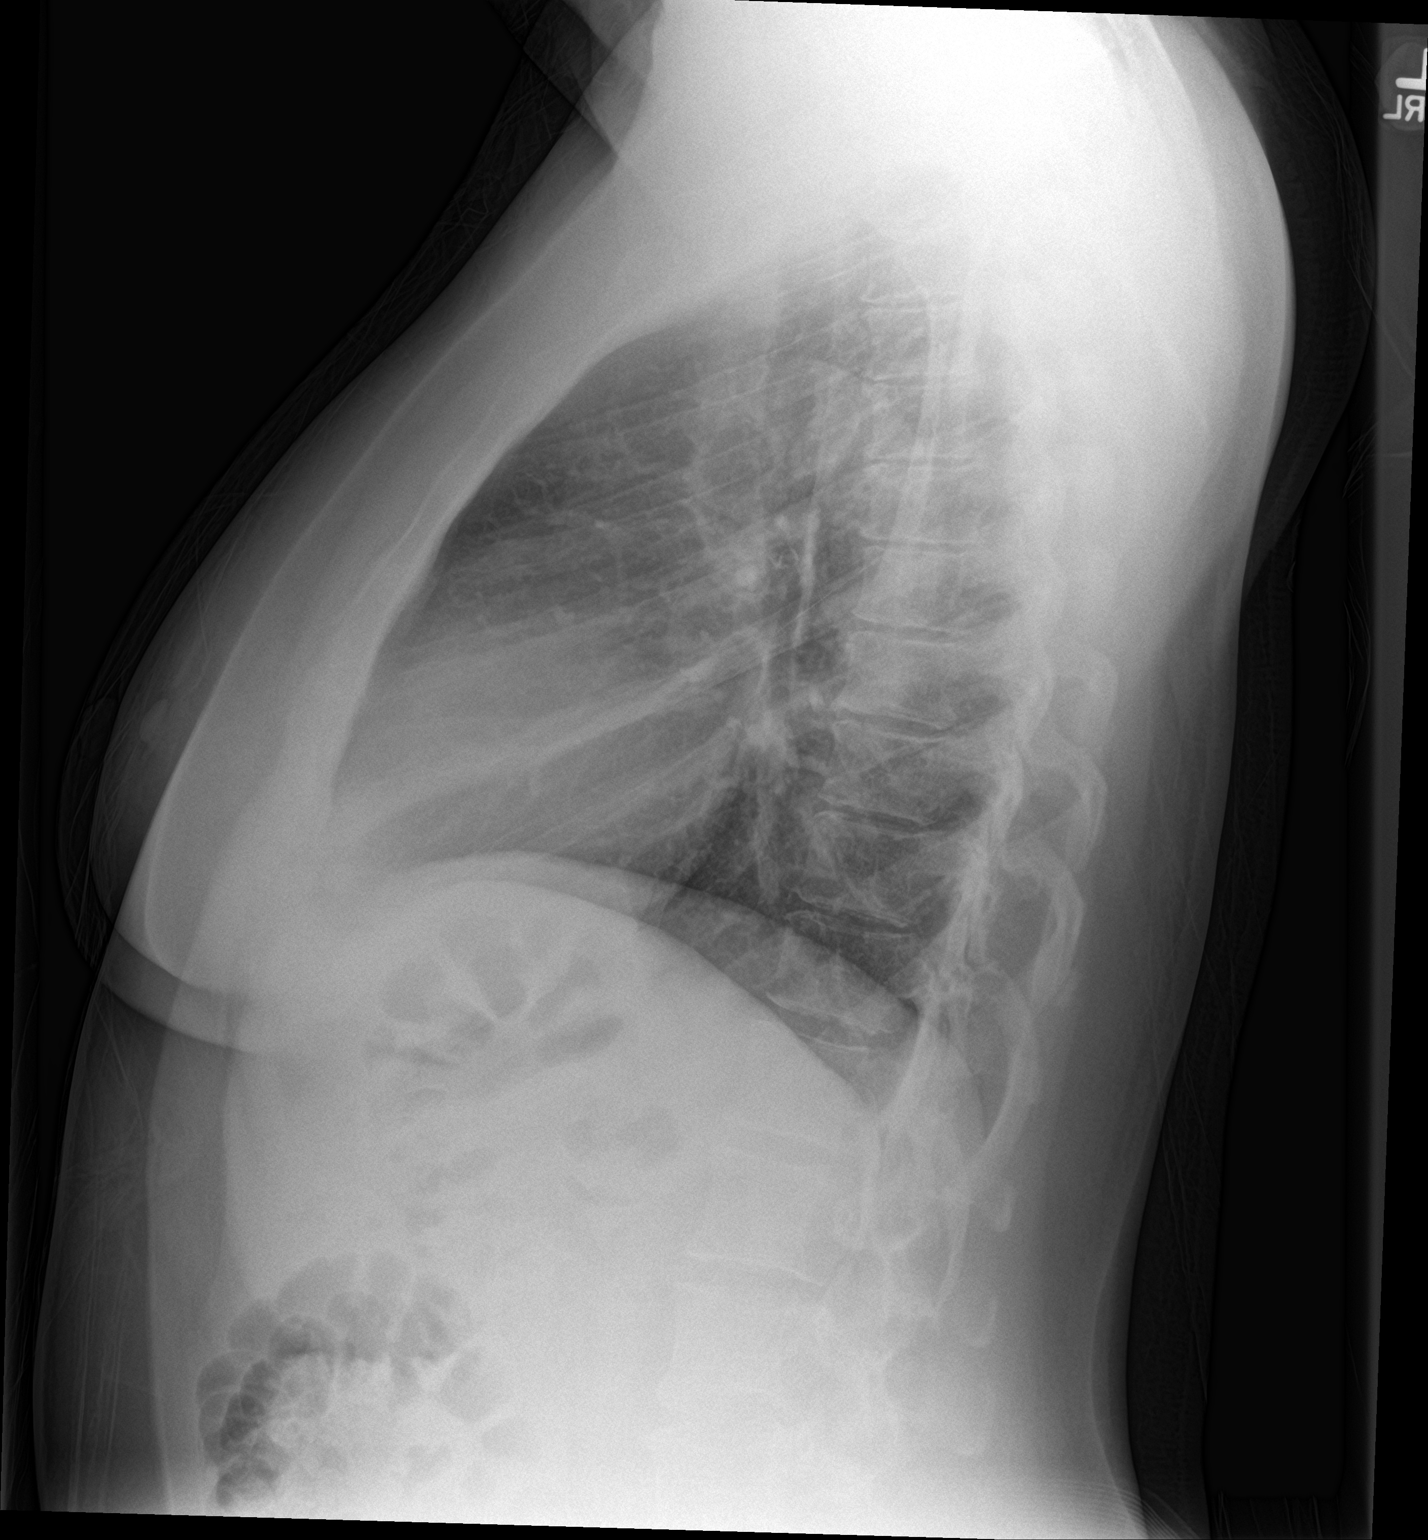

[2 of 2 positions shown; findings below may reference images not displayed]

FINDINGS: The heart size and mediastinal contours are within normal limits.
Both lungs are clear. The visualized skeletal structures are
unremarkable.
IMPRESSION: No active cardiopulmonary disease.

## 2019-02-23 ENCOUNTER — Ambulatory Visit (INDEPENDENT_AMBULATORY_CARE_PROVIDER_SITE_OTHER): Payer: Managed Care, Other (non HMO) | Admitting: Nurse Practitioner

## 2019-02-23 ENCOUNTER — Encounter: Payer: Self-pay | Admitting: Nurse Practitioner

## 2019-02-23 ENCOUNTER — Other Ambulatory Visit: Payer: Self-pay

## 2019-02-23 VITALS — BP 136/92 | HR 72 | Temp 95.8°F | Ht 64.0 in | Wt 149.8 lb

## 2019-02-23 DIAGNOSIS — R3129 Other microscopic hematuria: Secondary | ICD-10-CM

## 2019-02-23 DIAGNOSIS — I10 Essential (primary) hypertension: Secondary | ICD-10-CM | POA: Diagnosis not present

## 2019-02-23 DIAGNOSIS — F419 Anxiety disorder, unspecified: Secondary | ICD-10-CM | POA: Diagnosis not present

## 2019-02-23 MED ORDER — METOPROLOL SUCCINATE ER 25 MG PO TB24: 12.5000 mg | ORAL_TABLET | Freq: Every day | ORAL | 1 refills | Status: DC

## 2019-02-23 NOTE — Progress Notes (Signed)
Virtual Visit via Video Note  I connected with Samantha Stephens on 02/23/19 at  1:40 PM EDT by a video enabled telemedicine application and verified that I am speaking with the correct person using two identifiers.   Staff discussed the limitations of evaluation and management by telemedicine and the availability of in person appointments. The patient expressed understanding and agreed to proceed.  Patient location: home  My location: home office Other people present: none HPI  Patient was seen at [facility] ER on 02/20/2019 due the hypertension, shortness of breath and anxiety. She was feeling flushed and having palpitation so she took and extra half dose of amlodipine and her hydroxyzine and presented to ER. Felt relief being in ER. Thinks anxiety is related to anniversary of husbands death. Her blood pressure was 180/112. EKG- Normal sinus rhythm. Chest x-ray cleared. No anemia or electrolyte disturbances.  States since she has been home her blood pressures having been fluctuating.  125/90, 135/87, 150/98,   Patient states has had some GI upset the past few days, with soft stools. BM today was watery and had bright red blood. States has about 3-4 episodes of diarrhea. Denies nausea, vomiting, fevers. Patient endorses right flank pain- intermittent- states pain was intermittent feels like gas pain.   States she gets attacks with sharp abdominal pain feels gets chest pressure, palpitations, her arms feel cold and she gets shortness breath, head feels tight and her blood pressure gets high. These episodes hit suddenly then lasts for a few minutes. States has had it almost every evening this last week. Sunday will be one year since her husbands death.    PHQ2/9:/ Depression screen [facility] 2/9 02/17/2019 11/20/2018 09/11/2018 08/17/2018 06/01/2018  Decreased Interest 0 0 0 0 0  Down, Depressed, Hopeless 0 2 1 0 0  PHQ - 2 Score 0 2 1 0 0  Altered sleeping 0 1 1 - 3  Tired, decreased energy 0 1 1 - 3   Change in appetite 0 0 0 - 0  Feeling bad or failure about yourself  0 0 0 - 0  Trouble concentrating 0 1 0 - 1  Moving slowly or fidgety/restless 0 0 0 - 0  Suicidal thoughts 0 0 0 - 0  PHQ-9 Score 0 5 3 - 7  Difficult doing work/chores Not difficult at all Not difficult at all Not difficult at all - Somewhat difficult    PHQ reviewed. Negative  Patient Active Problem List   Diagnosis Date Noted  . Statin intolerance 02/03/2019  . Hyperlipidemia LDL goal <70 12/16/2018  . Grief reaction 06/01/2018  . Type 2 diabetes mellitus ([place]) 04/22/2018  . 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 OSA on CPAP 09/15/2017  . Hepatitis C antibody test positive 03/12/2017  . Preventative health care 03/10/2017  . Hematuria, gross 11/12/2016  . Medication monitoring encounter 09/09/2016  . Chronic hip pain, right 09/09/2016  . Numbness of right foot 09/09/2016  . Scoliosis 03/08/2016  . 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 Family hx of colon cancer requiring screening colonoscopy 03/08/2016  . Situational anxiety 10/27/2015  . Hypertension goal BP (blood pressure) < 130/80 08/18/2015  . Insomnia, controlled 08/18/2015  . Chronic mid back pain 08/18/2015    Past Medical History:  Diagnosis Date  . Arthritis   . Chronic mid back pain 08/18/2015  . 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 Family hx of colon cancer requiring screening colonoscopy 03/08/2016  . Headache   . Hyperlipidemia   .  Hypertension   . Hypokalemia   . Palpitations   . Scoliosis 03/08/2016  . Sleep apnea   . Stress headaches     Past Surgical History:  Procedure Laterality Date  . ABDOMINAL HYSTERECTOMY    . CESAREAN SECTION     1  . COLONOSCOPY    . CYSTOSCOPY W/ RETROGRADES Bilateral 11/25/2016   Procedure: CYSTOSCOPY WITH RETROGRADE PYELOGRAM;  Surgeon: Hollice Espy, MD;  Location: [facility] ORS;  Service: Urology;  Laterality: Bilateral;  . EYE SURGERY     lasix eye surgery     Social History   Tobacco Use  . Smoking status: Former Research scientist (life sciences)  . Smokeless tobacco: Never Used  . Tobacco comment: but only for a short amount of time  Substance Use Topics  . Alcohol use: Yes    Alcohol/week: 0.0 - 1.0 standard drinks    Comment: occasional glass of wine     Current Outpatient Medications:  .  LORazepam (ATIVAN) 1 MG tablet, Take 1 tablet (1 mg total) by mouth every 8 (eight) hours as needed for anxiety., Disp: 15 tablet, Rfl: 0 .  albuterol (PROVENTIL HFA;VENTOLIN HFA) 108 (90 Base) MCG/ACT inhaler, Inhale 2 puffs into the lungs every 4 (four) hours as needed for wheezing or shortness of breath. (Patient not taking: Reported on 02/03/2019), Disp: 1 Inhaler, Rfl: 1 .  amLODipine (NORVASC) 5 MG tablet, Take 1 tablet (5 mg total) by mouth daily., Disp: 90 tablet, Rfl: 3 .  aspirin EC 81 MG tablet, Take 1 tablet (81 mg total) by mouth daily., Disp: , Rfl:  .  Cholecalciferol (VITAMIN D3) 2000 units TABS, Take 1 capsule by mouth daily., Disp: , Rfl:  .  estradiol (ESTRACE VAGINAL) 0.1 MG/GM vaginal cream, Place 1 Applicatorful vaginally 2 (two) times a week., Disp: 127.5 g, Rfl: 3 .  glucose blood (ONETOUCH VERIO) test strip, Check fingerstick blood sugars once a day before breakfast; Dx E11.9; LON 99 months, Disp: 100 each, Rfl: 3 .  hydrOXYzine (VISTARIL) 25 MG capsule, Take 1 capsule (25 mg total) by mouth 3 (three) times daily as needed for anxiety. (Patient not taking: Reported on 02/03/2019), Disp: 30 capsule, Rfl: 0 .  Lancets (ONETOUCH ULTRASOFT) lancets, Check fingerstick blood sugars once a day before breakfast; Dx E11.9, LON 99 months, Disp: 100 each, Rfl: 3 .  Melatonin 5 MG CAPS, Take 5 mg by mouth daily., Disp: , Rfl:  .  metFORMIN (GLUCOPHAGE-XR) 500 MG 24 hr tablet, TAKE 1 TABLET(500 MG) BY MOUTH DAILY WITH BREAKFAST, Disp: 90 tablet, Rfl: 1 .  metoprolol succinate (TOPROL-XL) 25 MG 24 hr tablet, Take 0.5 tablets (12.5 mg total) by mouth daily., Disp: 45  tablet, Rfl: 1 .  Multiple Vitamin (MULTIVITAMIN) tablet, Take 1 tablet by mouth daily., Disp: , Rfl:  .  potassium chloride (K-DUR) 10 MEQ tablet, Take 1 tablet (10 mEq total) by mouth daily., Disp: 5 tablet, Rfl: 0 .  sertraline (ZOLOFT) 50 MG tablet, One-half of a pill by mouth every morning for 8 days, then one whole pill, Disp: 90 tablet, Rfl: 0 .  zolpidem (AMBIEN CR) 6.25 MG CR tablet, Take 3.125 mg by mouth as needed. , Disp: , Rfl: 2  Allergies  Allergen Reactions  . Trazodone And Nefazodone Other (See Comments)    Syncope; see in ER Feb 2018  . Benicar [Olmesartan] Cough    ROS   No other specific complaints in a complete review of systems (except as listed in HPI above).  Objective  Vitals:   02/23/19 1311  BP: (!) 136/92  Pulse: 72  Temp: (!) 95.8 F (35.4 C)  TempSrc: Axillary  Weight: 149 lb 12.8 oz (67.9 kg)  Height: 5' 4"  (1.626 m)    Body mass index is 25.71 kg/m.  Nursing Note and Vital Signs reviewed.  Physical Exam   Constitutional: Patient appears well-developed and well-nourished. No distress.  HENT: Head: Normocephalic and atraumatic. Pulmonary/Chest: Effort normal  Musculoskeletal: Normal range of motion,  Neurological: he is alert and oriented to person, place, and time. speech and gait are normal.  Skin: No rash noted. No erythema.  Psychiatric: Patient has a normal mood and affect. behavior is normal. Judgment and thought content normal.    Assessment & Plan  1. Essential hypertension Add on metoprolol 12.23m daily to current regimen; will check at home, provided ER precautions of BP is over 180/110 or elevated with any concerning symptoms.   2. Anxiety - continue zoloft try taking it at night time to reduce symptoms of GI upset, take hydroxyzine at first onset of anxiety symptoms. Reserve ativan for severe panic- cautioned with concurrent use with [place]   3. Other microscopic hematuria Will come by to provide sample within the  next 2 weeks.  - Urinalysis, Routine w reflex microscopic   Follow Up Instructions: Follow up on Friday per patient request.    I discussed the assessment and treatment plan with the patient. The patient was provided an opportunity to ask questions and all were answered. The patient agreed with the plan and demonstrated an understanding of the instructions.   The patient was advised to call back or seek an in-person evaluation if the symptoms worsen or if the condition fails to improve as anticipated.  I provided 34 minutes of non-face-to-face time during this encounter.   EFredderick Severance NP

## 2019-02-25 ENCOUNTER — Encounter: Payer: Self-pay | Admitting: Family Medicine

## 2019-02-26 ENCOUNTER — Telehealth: Payer: Self-pay

## 2019-02-26 NOTE — Telephone Encounter (Signed)
Pt understands to come to the office sometime before 3:30 today and she did state she has loose stools and upset stomach.

## 2019-02-26 NOTE — Telephone Encounter (Signed)
Copied from [place] [id number]. Topic: General - Inquiry >> Feb 26, 2019  9:21 AM [name] wrote: Reason for CRM: Patient said that [name] was going to follow up with her today from her visit with her on 5/5. She wasn't sure if that was going to be a phone call or another virtual visit. She is feeling a little better but still having problems with her stomach. She said she was advised to collect a urine sample and she came by the office right at 4 yesterday and the doors were locked. Advised her the office closes at 4 right now. Does she need to come back and get a cup?  Please advise

## 2019-02-26 NOTE — Telephone Encounter (Signed)
Please call patient

## 2019-02-26 NOTE — Telephone Encounter (Signed)
Yes for urine cup because of hematuria; order in the computer.   If she is having frequent diarrhea, emesis and is coming in we can add on CBC and BMP, if its is abdominal pain and soft stools no labs.

## 2019-02-27 LAB — URINALYSIS, ROUTINE W REFLEX MICROSCOPIC
Bilirubin Urine: NEGATIVE
Glucose, UA: NEGATIVE
Hgb urine dipstick: NEGATIVE
Ketones, ur: NEGATIVE
Leukocytes,Ua: NEGATIVE
Nitrite: NEGATIVE
Protein, ur: NEGATIVE
Specific Gravity, Urine: 1.008 (ref 1.001–1.03)
pH: 7 (ref 5.0–8.0)

## 2019-03-19 ENCOUNTER — Encounter: Payer: Self-pay | Admitting: Nurse Practitioner

## 2019-03-19 ENCOUNTER — Ambulatory Visit: Payer: Managed Care, Other (non HMO) | Admitting: Nurse Practitioner

## 2019-03-19 ENCOUNTER — Other Ambulatory Visit: Payer: Self-pay

## 2019-03-19 VITALS — BP 122/78 | HR 80 | Temp 98.2°F | Resp 14 | Ht 64.0 in | Wt 142.4 lb

## 2019-03-19 DIAGNOSIS — K921 Melena: Secondary | ICD-10-CM

## 2019-03-19 DIAGNOSIS — R634 Abnormal weight loss: Secondary | ICD-10-CM | POA: Diagnosis not present

## 2019-03-19 NOTE — Patient Instructions (Signed)
-   Check daily weights - Complete stool cards from home - Follow- up in one month  Follow these instructions at home:   Eat a balanced diet. In each meal, include at least one food that is high in protein. Foods that are high in protein include: ? Meat. ? Poultry. ? Fish. ? Eggs. ? Cheese. ? Milk. ? Beans. ? Nuts.  Eat nutrient-rich foods that are easy to swallow and digest, such as: ? Fruit and yogurt smoothies. ? Oatmeal with nut butter.  Try to eat six small meals each day instead of three large meals.  Take vitamin and protein supplements as told by your health care provider or dietitian.  Follow your health care provider's recommendations about exercise and activity.  Keep all follow-up visits as told by your health care provider. This is important. Contact a health care provider if you:  Have increased weakness or fatigue.  Faint.  Are a woman and you stop having your period (menstruating).  Have rapid hair loss.  Have unexpected weight loss.  Have diarrhea.  Have nausea and vomiting. Get help right away if you have:  Difficulty breathing.  Chest pain. =

## 2019-03-19 NOTE — Progress Notes (Signed)
Name: Samantha Stephens   MRN: [id number]    DOB: January 19, 1961   Date:03/19/2019       Progress Note  Subjective  Chief Complaint  Chief Complaint  Patient presents with  . Weight Loss    HPI  Weight loss Patient has noticed weight loss without appetite changes over the past few months. No vigorous activity. Walks every day and does yoga. Patient was having some diarrhea the beginning of may but is back to normal. Started to track daily weights on 02/26/2019. Had scant blood with bowel movements that resolved when diarrhea resolved.   Cut out caffeine, is doing a ginger tea with probiotic in the mornings.  No alcohol or illicit drug use.  Denies depression or anxiety at this time.  Up to date on colonoscopy- 2018, mammogram 12/2018, Urinalysis without hematuria on 02/20/2019  Wt Readings from Last 3 Encounters:  03/19/19 142 lb 6.4 oz (64.6 kg)  02/23/19 149 lb 12.8 oz (67.9 kg)  02/19/19 152 lb (68.9 kg)     PHQ2/9: Depression screen [facility] 2/9 03/19/2019 02/17/2019 11/20/2018 09/11/2018 08/17/2018  Decreased Interest 0 0 0 0 0  Down, Depressed, Hopeless 0 0 2 1 0  PHQ - 2 Score 0 0 2 1 0  Altered sleeping 0 0 1 1 -  Tired, decreased energy 0 0 1 1 -  Change in appetite 0 0 0 0 -  Feeling bad or failure about yourself  0 0 0 0 -  Trouble concentrating 0 0 1 0 -  Moving slowly or fidgety/restless 0 0 0 0 -  Suicidal thoughts 0 0 0 0 -  PHQ-9 Score 0 0 5 3 -  Difficult doing work/chores Not difficult at all Not difficult at all Not difficult at all Not difficult at all -  Some recent data might be hidden     PHQ reviewed. Negative   Patient Active Problem List   Diagnosis Date Noted  . Statin intolerance 02/03/2019  . Hyperlipidemia LDL goal <70 12/16/2018  . Grief reaction 06/01/2018  . Type 2 diabetes mellitus ([place]) 04/22/2018  . 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 OSA on CPAP 09/15/2017  . Hepatitis C antibody test positive 03/12/2017  . Preventative health care  03/10/2017  . Hematuria, gross 11/12/2016  . Medication monitoring encounter 09/09/2016  . Chronic hip pain, right 09/09/2016  . Numbness of right foot 09/09/2016  . Scoliosis 03/08/2016  . 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 Family hx of colon cancer requiring screening colonoscopy 03/08/2016  . Situational anxiety 10/27/2015  . Hypertension goal BP (blood pressure) < 130/80 08/18/2015  . Insomnia, controlled 08/18/2015  . Chronic mid back pain 08/18/2015    Past Medical History:  Diagnosis Date  . Arthritis   . Chronic mid back pain 08/18/2015  . 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 Family hx of colon cancer requiring screening colonoscopy 03/08/2016  . Headache   . Hyperlipidemia   . Hypertension   . Hypokalemia   . Palpitations   . Scoliosis 03/08/2016  . Sleep apnea   . Stress headaches     Past Surgical History:  Procedure Laterality Date  . ABDOMINAL HYSTERECTOMY    . CESAREAN SECTION     1  . COLONOSCOPY    . CYSTOSCOPY W/ RETROGRADES Bilateral 11/25/2016   Procedure: CYSTOSCOPY WITH RETROGRADE PYELOGRAM;  Surgeon: Hollice Espy, MD;  Location: [facility] ORS;  Service: Urology;  Laterality: Bilateral;  . EYE SURGERY  lasix eye surgery    Social History   Tobacco Use  . Smoking status: Former Research scientist (life sciences)  . Smokeless tobacco: Never Used  . Tobacco comment: but only for a short amount of time  Substance Use Topics  . Alcohol use: Yes    Alcohol/week: 0.0 - 1.0 standard drinks    Comment: occasional glass of wine     Current Outpatient Medications:  .  amLODipine (NORVASC) 5 MG tablet, Take 1 tablet (5 mg total) by mouth daily., Disp: 90 tablet, Rfl: 3 .  aspirin EC 81 MG tablet, Take 1 tablet (81 mg total) by mouth daily., Disp: , Rfl:  .  Cholecalciferol (VITAMIN D3) 2000 units TABS, Take 1 capsule by mouth daily., Disp: , Rfl:  .  hydrOXYzine (VISTARIL) 25 MG capsule, Take 1 capsule (25 mg total) by mouth 3 (three) times daily  as needed for anxiety., Disp: 30 capsule, Rfl: 0 .  LORazepam (ATIVAN) 1 MG tablet, Take 1 tablet (1 mg total) by mouth every 8 (eight) hours as needed for anxiety., Disp: 15 tablet, Rfl: 0 .  metoprolol succinate (TOPROL-XL) 25 MG 24 hr tablet, Take 0.5 tablets (12.5 mg total) by mouth daily., Disp: 45 tablet, Rfl: 1 .  Multiple Vitamin (MULTIVITAMIN) tablet, Take 1 tablet by mouth daily., Disp: , Rfl:  .  potassium chloride (K-DUR) 10 MEQ tablet, Take 1 tablet (10 mEq total) by mouth daily., Disp: 5 tablet, Rfl: 0 .  zolpidem (AMBIEN CR) 6.25 MG CR tablet, Take 3.125 mg by mouth as needed. , Disp: , Rfl: 2 .  albuterol (PROVENTIL HFA;VENTOLIN HFA) 108 (90 Base) MCG/ACT inhaler, Inhale 2 puffs into the lungs every 4 (four) hours as needed for wheezing or shortness of breath. (Patient not taking: Reported on 02/03/2019), Disp: 1 Inhaler, Rfl: 1 .  estradiol (ESTRACE VAGINAL) 0.1 MG/GM vaginal cream, Place 1 Applicatorful vaginally 2 (two) times a week. (Patient not taking: Reported on 03/19/2019), Disp: 127.5 g, Rfl: 3 .  glucose blood (ONETOUCH VERIO) test strip, Check fingerstick blood sugars once a day before breakfast; Dx E11.9; LON 99 months, Disp: 100 each, Rfl: 3 .  Lancets (ONETOUCH ULTRASOFT) lancets, Check fingerstick blood sugars once a day before breakfast; Dx E11.9, LON 99 months, Disp: 100 each, Rfl: 3 .  Melatonin 5 MG CAPS, Take 5 mg by mouth daily., Disp: , Rfl:  .  metFORMIN (GLUCOPHAGE-XR) 500 MG 24 hr tablet, TAKE 1 TABLET(500 MG) BY MOUTH DAILY WITH BREAKFAST (Patient not taking: Reported on 03/19/2019), Disp: 90 tablet, Rfl: 1 .  sertraline (ZOLOFT) 50 MG tablet, One-half of a pill by mouth every morning for 8 days, then one whole pill (Patient not taking: Reported on 03/19/2019), Disp: 90 tablet, Rfl: 0  Allergies  Allergen Reactions  . Trazodone And Nefazodone Other (See Comments)    Syncope; see in ER Feb 2018  . Benicar [Olmesartan] Cough    Review of Systems   Constitutional: Positive for malaise/fatigue. Negative for chills and fever.  HENT: Negative for congestion, sinus pain, sore throat and tinnitus.   Eyes: Negative for blurred vision and double vision.  Respiratory: Negative for cough and shortness of breath.   Cardiovascular: Positive for palpitations (mild, follows with dr. Clayborn Bigness). Negative for chest pain and leg swelling.  Gastrointestinal: Negative for abdominal pain, constipation, diarrhea and nausea.  Genitourinary: Negative for dysuria and frequency.  Musculoskeletal: Positive for joint pain (right shoulder stiffness and mild pain). Negative for falls and myalgias.  Skin: Negative for rash.  Neurological: Negative for dizziness, tingling and headaches.  Endo/Heme/Allergies: Negative for polydipsia.  Psychiatric/Behavioral: The patient is not nervous/anxious and does not have insomnia.     No other specific complaints in a complete review of systems (except as listed in HPI above).  Objective  Vitals:   03/19/19 1446  BP: 122/78  Pulse: 80  Resp: 14  Temp: 98.2 F (36.8 C)  TempSrc: Oral  SpO2: 95%  Weight: 142 lb 6.4 oz (64.6 kg)  Height: 5' 4"  (1.626 m)    Body mass index is 24.44 kg/m.  Nursing Note and Vital Signs reviewed.  Physical Exam Constitutional:      Appearance: Normal appearance. She is well-developed.  HENT:     Head: Normocephalic and atraumatic.     Right Ear: Hearing normal.     Left Ear: Hearing normal.  Eyes:     Conjunctiva/sclera: Conjunctivae normal.  Cardiovascular:     Rate and Rhythm: Normal rate and regular rhythm.     Heart sounds: Normal heart sounds.  Pulmonary:     Effort: Pulmonary effort is normal.     Breath sounds: Normal breath sounds.  Musculoskeletal: Normal range of motion.  Neurological:     Mental Status: She is alert and oriented to person, place, and time.  Psychiatric:        Speech: Speech normal.        Behavior: Behavior normal. Behavior is cooperative.         Thought Content: Thought content normal.        Judgment: Judgment normal.       No results found for this or any previous visit (from the past 48 hour(s)).  Assessment & Plan  1. Weight loss, unintentional Increase protein in diet, reassurance provided.  - Sed Rate (ESR) - C-reactive protein - Hepatitis panel, acute - HIV antibody (with reflex) - COMPLETE METABOLIC PANEL WITH GFR - CBC with Differential  2. Blood in stool - POC Hemoccult Bld/Stl (3-Cd Home Screen); Future  Follow-up in one month.  Face-to-face time with patient was more than 25 minutes, >50% time spent counseling and coordination of care

## 2019-03-20 LAB — CBC WITH DIFFERENTIAL/PLATELET
Absolute Monocytes: 480 cells/uL (ref 200–950)
Basophils Absolute: 68 cells/uL (ref 0–200)
Basophils Relative: 0.9 %
Eosinophils Absolute: 383 cells/uL (ref 15–500)
Eosinophils Relative: 5.1 %
HCT: 39.7 % (ref 35.0–45.0)
Hemoglobin: 13.5 g/dL (ref 11.7–15.5)
Lymphs Abs: 3285 cells/uL (ref 850–3900)
MCH: 25.9 pg — ABNORMAL LOW (ref 27.0–33.0)
MCHC: 34 g/dL (ref 32.0–36.0)
MCV: 76.1 fL — ABNORMAL LOW (ref 80.0–100.0)
MPV: 10.2 fL (ref 7.5–12.5)
Monocytes Relative: 6.4 %
Neutro Abs: 3285 cells/uL (ref 1500–7800)
Neutrophils Relative %: 43.8 %
Platelets: 315 10*3/uL (ref 140–400)
RBC: 5.22 10*6/uL — ABNORMAL HIGH (ref 3.80–5.10)
RDW: 15.2 % — ABNORMAL HIGH (ref 11.0–15.0)
Total Lymphocyte: 43.8 %
WBC: 7.5 10*3/uL (ref 3.8–10.8)

## 2019-03-20 LAB — COMPLETE METABOLIC PANEL WITH GFR
AG Ratio: 1.8 (calc) (ref 1.0–2.5)
ALT: 15 U/L (ref 6–29)
AST: 17 U/L (ref 10–35)
Albumin: 4.6 g/dL (ref 3.6–5.1)
Alkaline phosphatase (APISO): 74 U/L (ref 37–153)
BUN: 16 mg/dL (ref 7–25)
CO2: 24 mmol/L (ref 20–32)
Calcium: 9.7 mg/dL (ref 8.6–10.4)
Chloride: 105 mmol/L (ref 98–110)
Creat: 0.85 mg/dL (ref 0.50–1.05)
GFR, Est African American: 88 mL/min/{1.73_m2} (ref >=60)
GFR, Est Non African American: 76 mL/min/{1.73_m2} (ref >=60)
Globulin: 2.6 g/dL (calc) (ref 1.9–3.7)
Glucose, Bld: 77 mg/dL (ref 65–99)
Potassium: 4.2 mmol/L (ref 3.5–5.3)
Sodium: 140 mmol/L (ref 135–146)
Total Bilirubin: 0.4 mg/dL (ref 0.2–1.2)
Total Protein: 7.2 g/dL (ref 6.1–8.1)

## 2019-03-20 LAB — C-REACTIVE PROTEIN: CRP: 2.7 mg/L (ref <8.0)

## 2019-03-20 LAB — HIV ANTIBODY (ROUTINE TESTING W REFLEX): HIV 1&2 Ab, 4th Generation: NONREACTIVE

## 2019-03-20 LAB — SEDIMENTATION RATE

## 2019-03-21 ENCOUNTER — Other Ambulatory Visit: Payer: Self-pay | Admitting: Family Medicine

## 2019-03-24 ENCOUNTER — Telehealth: Payer: Self-pay | Admitting: Nurse Practitioner

## 2019-03-24 LAB — SEDIMENTATION RATE: Sed Rate: 22 mm/h (ref 0–30)

## 2019-03-24 NOTE — Telephone Encounter (Signed)
Called lab about sed rate states had instrumentation error during first run and was unable to run sample again. Will need new sample to re-test. ____ Patients CRP levels and sed rate helps look at inflammatory markers, ordered to see if weight loss was contributed to inflammatory condition- her CRP levels were normal and this test is generally more sensitive and accurate for acute inflammation. We are sorry for the numerous draws but I do not think she needs to be stuck again for this test. We can refer to GI if continuing to have weight loss and abdominal pain but for now we will just watch and wait.

## 2019-03-24 NOTE — Telephone Encounter (Signed)
Patient requesting xray of neck, no paresthesias. Recommend heat, ROM exercises and nsaids. Declined muscle relaxers. monitor for 4-6 weeks if not improved follow-up in clinic

## 2019-03-25 NOTE — Telephone Encounter (Signed)
Per [name], sed rate was normal, advised patient to increase protein and routinely follow up.

## 2019-03-29 ENCOUNTER — Ambulatory Visit: Payer: Managed Care, Other (non HMO) | Admitting: Family Medicine

## 2019-04-13 ENCOUNTER — Other Ambulatory Visit: Payer: Self-pay | Admitting: Acute Care

## 2019-04-13 ENCOUNTER — Other Ambulatory Visit (HOSPITAL_COMMUNITY): Payer: Self-pay | Admitting: Acute Care

## 2019-04-13 DIAGNOSIS — M5030 Other cervical disc degeneration, unspecified cervical region: Secondary | ICD-10-CM

## 2019-04-16 ENCOUNTER — Other Ambulatory Visit: Payer: Self-pay | Admitting: Family Medicine

## 2019-04-19 ENCOUNTER — Other Ambulatory Visit: Payer: Self-pay

## 2019-04-19 ENCOUNTER — Encounter: Payer: Self-pay | Admitting: Nurse Practitioner

## 2019-04-19 ENCOUNTER — Ambulatory Visit (INDEPENDENT_AMBULATORY_CARE_PROVIDER_SITE_OTHER): Payer: Managed Care, Other (non HMO) | Admitting: Nurse Practitioner

## 2019-04-19 VITALS — BP 126/85 | HR 80 | Temp 95.0°F | Ht 64.0 in | Wt 144.8 lb

## 2019-04-19 DIAGNOSIS — K5900 Constipation, unspecified: Secondary | ICD-10-CM

## 2019-04-19 DIAGNOSIS — M5030 Other cervical disc degeneration, unspecified cervical region: Secondary | ICD-10-CM | POA: Diagnosis not present

## 2019-04-19 DIAGNOSIS — G4700 Insomnia, unspecified: Secondary | ICD-10-CM | POA: Diagnosis not present

## 2019-04-19 DIAGNOSIS — I10 Essential (primary) hypertension: Secondary | ICD-10-CM | POA: Diagnosis not present

## 2019-04-19 DIAGNOSIS — R634 Abnormal weight loss: Secondary | ICD-10-CM

## 2019-04-19 NOTE — Progress Notes (Signed)
Virtual Visit via Video Note  I connected with Samantha Stephens on 04/19/19 at  1:20 PM EDT by a video enabled telemedicine application and verified that I am speaking with the correct person using two identifiers.   Staff discussed the limitations of evaluation and management by telemedicine and the availability of in person appointments. The patient expressed understanding and agreed to proceed.  Patient location: home  My location: work office Other people present:  none HPI  Overall is feeling much better, has energy and her weight has been maintaining between 145-150. Appetite is good- has been doing ensure every other day. Patient blood pressure 109-132/70-90- checking it daily and complying with low salt diet. Occasionally has a bit of constipation that is improved with increased fiber. Denies palpitations or increased anxiety. Neurology completed xray for neck pain that showed-Cervical osteoarthritis, she has MRI scheduled for next month. She follows up with neurology for insomnia as well- takes [place] and this is working well for her.   PHQ2/9: Depression screen [facility] 2/9 04/19/2019 03/19/2019 02/17/2019 11/20/2018 09/11/2018  Decreased Interest 0 0 0 0 0  Down, Depressed, Hopeless 0 0 0 2 1  PHQ - 2 Score 0 0 0 2 1  Altered sleeping 0 0 0 1 1  Tired, decreased energy 0 0 0 1 1  Change in appetite 0 0 0 0 0  Feeling bad or failure about yourself  0 0 0 0 0  Trouble concentrating 0 0 0 1 0  Moving slowly or fidgety/restless 0 0 0 0 0  Suicidal thoughts 0 0 0 0 0  PHQ-9 Score 0 0 0 5 3  Difficult doing work/chores Not difficult at all Not difficult at all Not difficult at all Not difficult at all Not difficult at all  Some recent data might be hidden     PHQ reviewed. Negative  Patient Active Problem List   Diagnosis Date Noted  . Statin intolerance 02/03/2019  . Hyperlipidemia LDL goal <70 12/16/2018  . Grief reaction 06/01/2018  . Type 2 diabetes mellitus ([place]) 04/22/2018  .  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 OSA on CPAP 09/15/2017  . Hepatitis C antibody test positive 03/12/2017  . Preventative health care 03/10/2017  . Hematuria, gross 11/12/2016  . Medication monitoring encounter 09/09/2016  . Chronic hip pain, right 09/09/2016  . Numbness of right foot 09/09/2016  . Scoliosis 03/08/2016  . 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 Family hx of colon cancer requiring screening colonoscopy 03/08/2016  . Situational anxiety 10/27/2015  . Hypertension goal BP (blood pressure) < 130/80 08/18/2015  . Insomnia, controlled 08/18/2015  . Chronic mid back pain 08/18/2015    Past Medical History:  Diagnosis Date  . Arthritis   . Chronic mid back pain 08/18/2015  . 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 Family hx of colon cancer requiring screening colonoscopy 03/08/2016  . Headache   . Hyperlipidemia   . Hypertension   . Hypokalemia   . Palpitations   . Scoliosis 03/08/2016  . Sleep apnea   . Stress headaches     Past Surgical History:  Procedure Laterality Date  . ABDOMINAL HYSTERECTOMY    . CESAREAN SECTION     1  . COLONOSCOPY    . CYSTOSCOPY W/ RETROGRADES Bilateral 11/25/2016   Procedure: CYSTOSCOPY WITH RETROGRADE PYELOGRAM;  Surgeon: Hollice Espy, MD;  Location: [facility] ORS;  Service: Urology;  Laterality: Bilateral;  . EYE SURGERY     lasix eye surgery  Social History   Tobacco Use  . Smoking status: Former Research scientist (life sciences)  . Smokeless tobacco: Never Used  . Tobacco comment: but only for a short amount of time  Substance Use Topics  . Alcohol use: Yes    Alcohol/week: 0.0 - 1.0 standard drinks    Comment: occasional glass of wine     Current Outpatient Medications:  .  amLODipine (NORVASC) 5 MG tablet, Take 1 tablet (5 mg total) by mouth daily., Disp: 90 tablet, Rfl: 3 .  aspirin EC 81 MG tablet, Take 1 tablet (81 mg total) by mouth daily., Disp: , Rfl:  .  Cholecalciferol (VITAMIN D3) 2000 units TABS, Take  1 capsule by mouth daily., Disp: , Rfl:  .  hydrOXYzine (VISTARIL) 25 MG capsule, Take 1 capsule (25 mg total) by mouth 3 (three) times daily as needed for anxiety., Disp: 30 capsule, Rfl: 0 .  LORazepam (ATIVAN) 1 MG tablet, Take 1 tablet (1 mg total) by mouth every 8 (eight) hours as needed for anxiety., Disp: 15 tablet, Rfl: 0 .  Melatonin 5 MG CAPS, Take 5 mg by mouth daily., Disp: , Rfl:  .  metoprolol succinate (TOPROL-XL) 25 MG 24 hr tablet, TAKE 1 TABLET(25 MG) BY MOUTH DAILY (Patient taking differently: Take 12.5 mg by mouth daily. ), Disp: 90 tablet, Rfl: 0 .  Multiple Vitamin (MULTIVITAMIN) tablet, Take 1 tablet by mouth daily., Disp: , Rfl:  .  potassium chloride (K-DUR) 10 MEQ tablet, Take 1 tablet (10 mEq total) by mouth daily., Disp: 5 tablet, Rfl: 0 .  zolpidem (AMBIEN CR) 6.25 MG CR tablet, Take 3.125 mg by mouth as needed. , Disp: , Rfl: 2 .  glucose blood (ONETOUCH VERIO) test strip, Check fingerstick blood sugars once a day before breakfast; Dx E11.9; LON 99 months, Disp: 100 each, Rfl: 3 .  Lancets (ONETOUCH ULTRASOFT) lancets, Check fingerstick blood sugars once a day before breakfast; Dx E11.9, LON 99 months, Disp: 100 each, Rfl: 3  Allergies  Allergen Reactions  . Trazodone And Nefazodone Other (See Comments)    Syncope; see in ER Feb 2018  . Benicar [Olmesartan] Cough    ROS   No other specific complaints in a complete review of systems (except as listed in HPI above).  Objective  Vitals:   04/19/19 1306  BP: 126/85  Pulse: 80  Temp: (!) 95 F (35 C)  TempSrc: Axillary  Weight: 144 lb 12.8 oz (65.7 kg)  Height: 5' 4"  (1.626 m)     Body mass index is 24.85 kg/m.  Nursing Note and Vital Signs reviewed.  Physical Exam   Constitutional: Patient appears well-developed and well-nourished. No distress.  HENT: Head: Normocephalic and atraumatic. Cardiovascular: Normal rate Pulmonary/Chest: Effort normal  Musculoskeletal: Normal range of motion,   Neurological: alert and oriented, speech normal.  Skin: No rash noted. No erythema.  Psychiatric: Patient has a normal mood and affect. behavior is normal. Judgment and thought content normal.    Assessment & Plan  1. Weight loss Stable, no longer losing weight  2. DDD (degenerative disc disease), cervical Uses massage, ice/heat, discussed OTC management. Has MRI scheduled through neurology   3. Hypertension goal BP (blood pressure) < 130/80 Stable, continue meds  4. Insomnia, controlled Stable continue meds  5. Constipation Discussed diet & hydration, stool softeners and fiber supplements as needed.    Follow Up Instructions:    I discussed the assessment and treatment plan with the patient. The patient was provided an opportunity to ask  questions and all were answered. The patient agreed with the plan and demonstrated an understanding of the instructions.   The patient was advised to call back or seek an in-person evaluation if the symptoms worsen or if the condition fails to improve as anticipated.  I provided 22 minutes of non-face-to-face time during this encounter.   Fredderick Severance, NP

## 2019-04-20 ENCOUNTER — Ambulatory Visit: Payer: Managed Care, Other (non HMO)

## 2019-04-20 ENCOUNTER — Other Ambulatory Visit: Payer: Self-pay

## 2019-04-20 DIAGNOSIS — K921 Melena: Secondary | ICD-10-CM

## 2019-04-20 LAB — POC HEMOCCULT BLD/STL (HOME/3-CARD/SCREEN)
Card #2 Fecal Occult Blod, POC: NEGATIVE
Card #3 Fecal Occult Blood, POC: NEGATIVE
Fecal Occult Blood, POC: NEGATIVE

## 2019-04-30 ENCOUNTER — Ambulatory Visit
Admission: RE | Admit: 2019-04-30 | Discharge: 2019-04-30 | Disposition: A | Discharge status: Home / Self Care | Payer: Managed Care, Other (non HMO) | Source: Ambulatory Visit | Attending: Acute Care | Admitting: Acute Care

## 2019-04-30 ENCOUNTER — Other Ambulatory Visit: Payer: Self-pay

## 2019-04-30 DIAGNOSIS — M5030 Other cervical disc degeneration, unspecified cervical region: Secondary | ICD-10-CM

## 2019-05-12 IMAGING — CR DG CHEST 2V
1 series · 2 of 2 positions shown · non-contrast
Comparison: 01/26/2018

CLINICAL DATA: Chest pain

EXAM:
CHEST - 2 VIEW

[Series 3: w chest pa · 0.14mm/px · 2 of 2 slices shown]
[im 1/2]
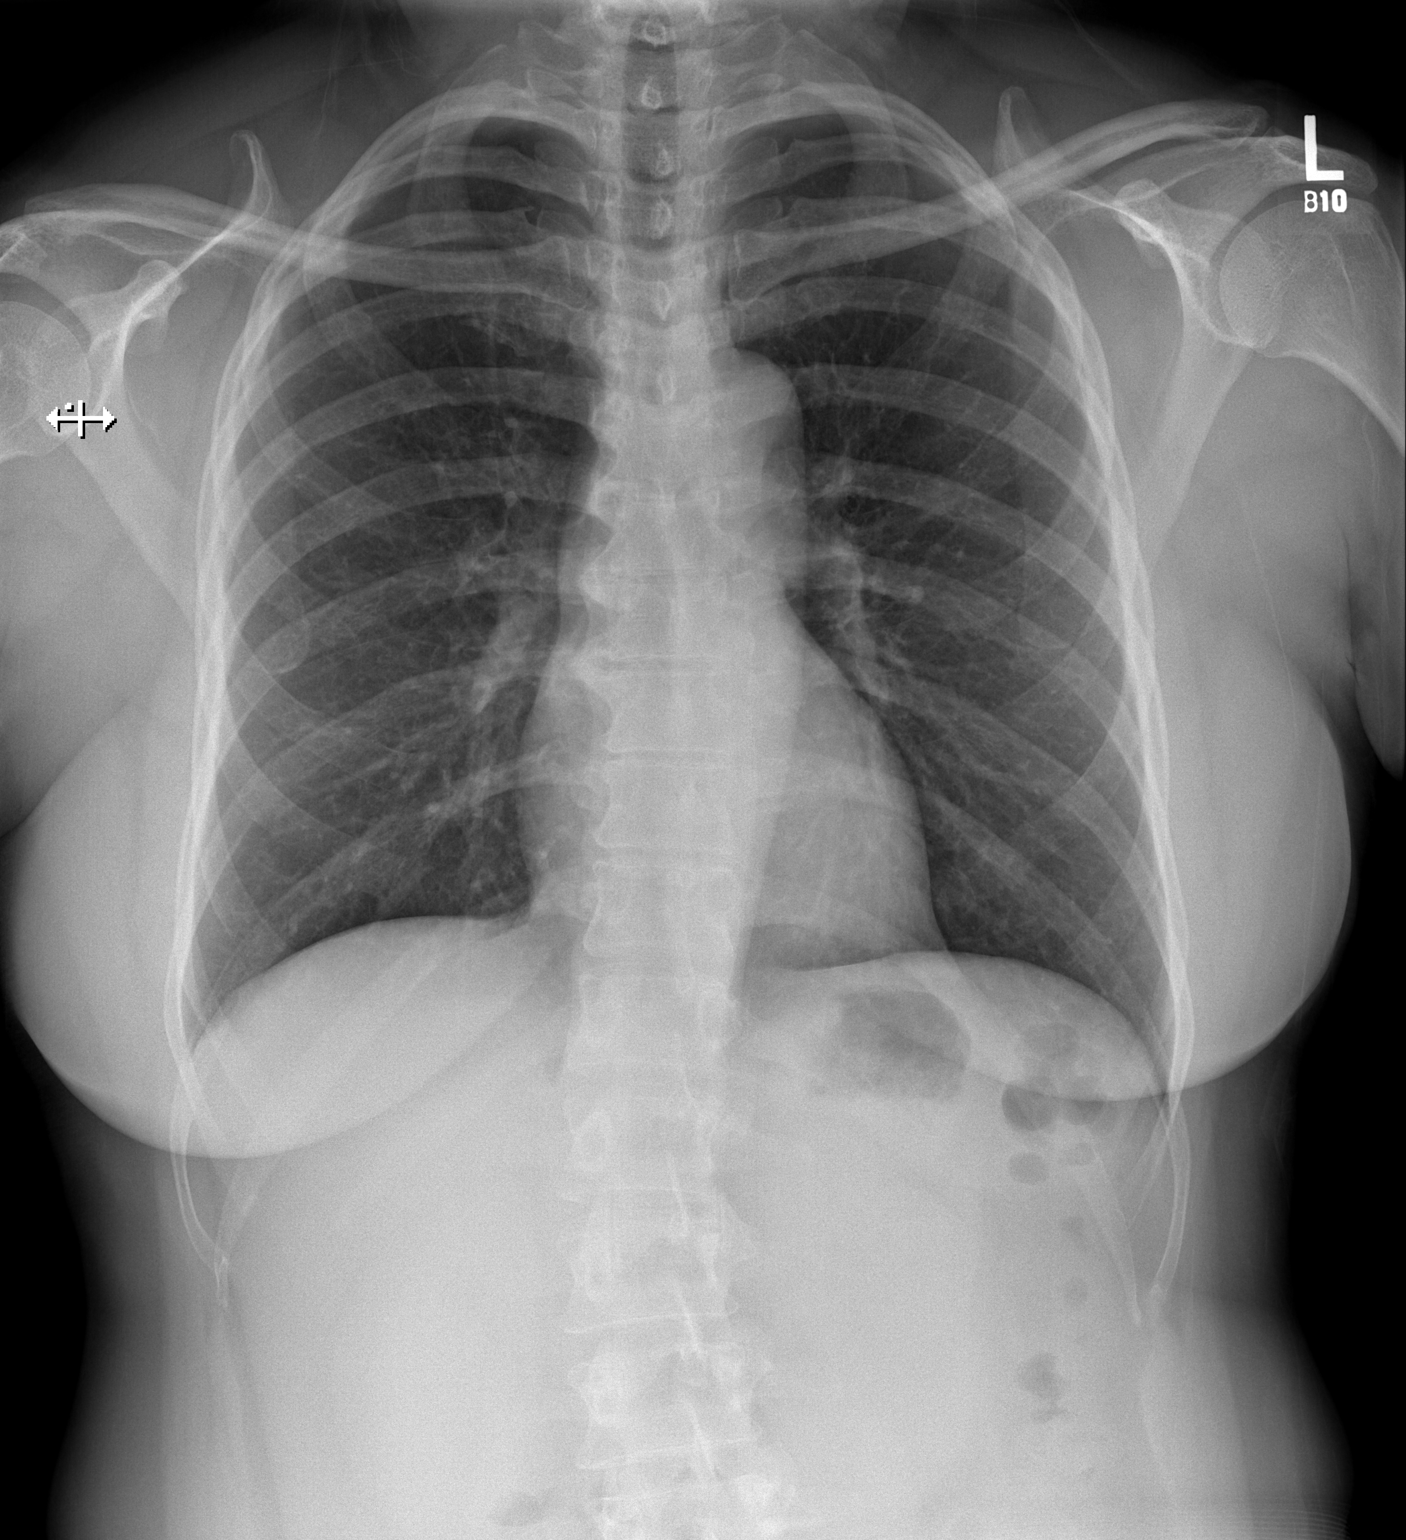
[im 2/2]
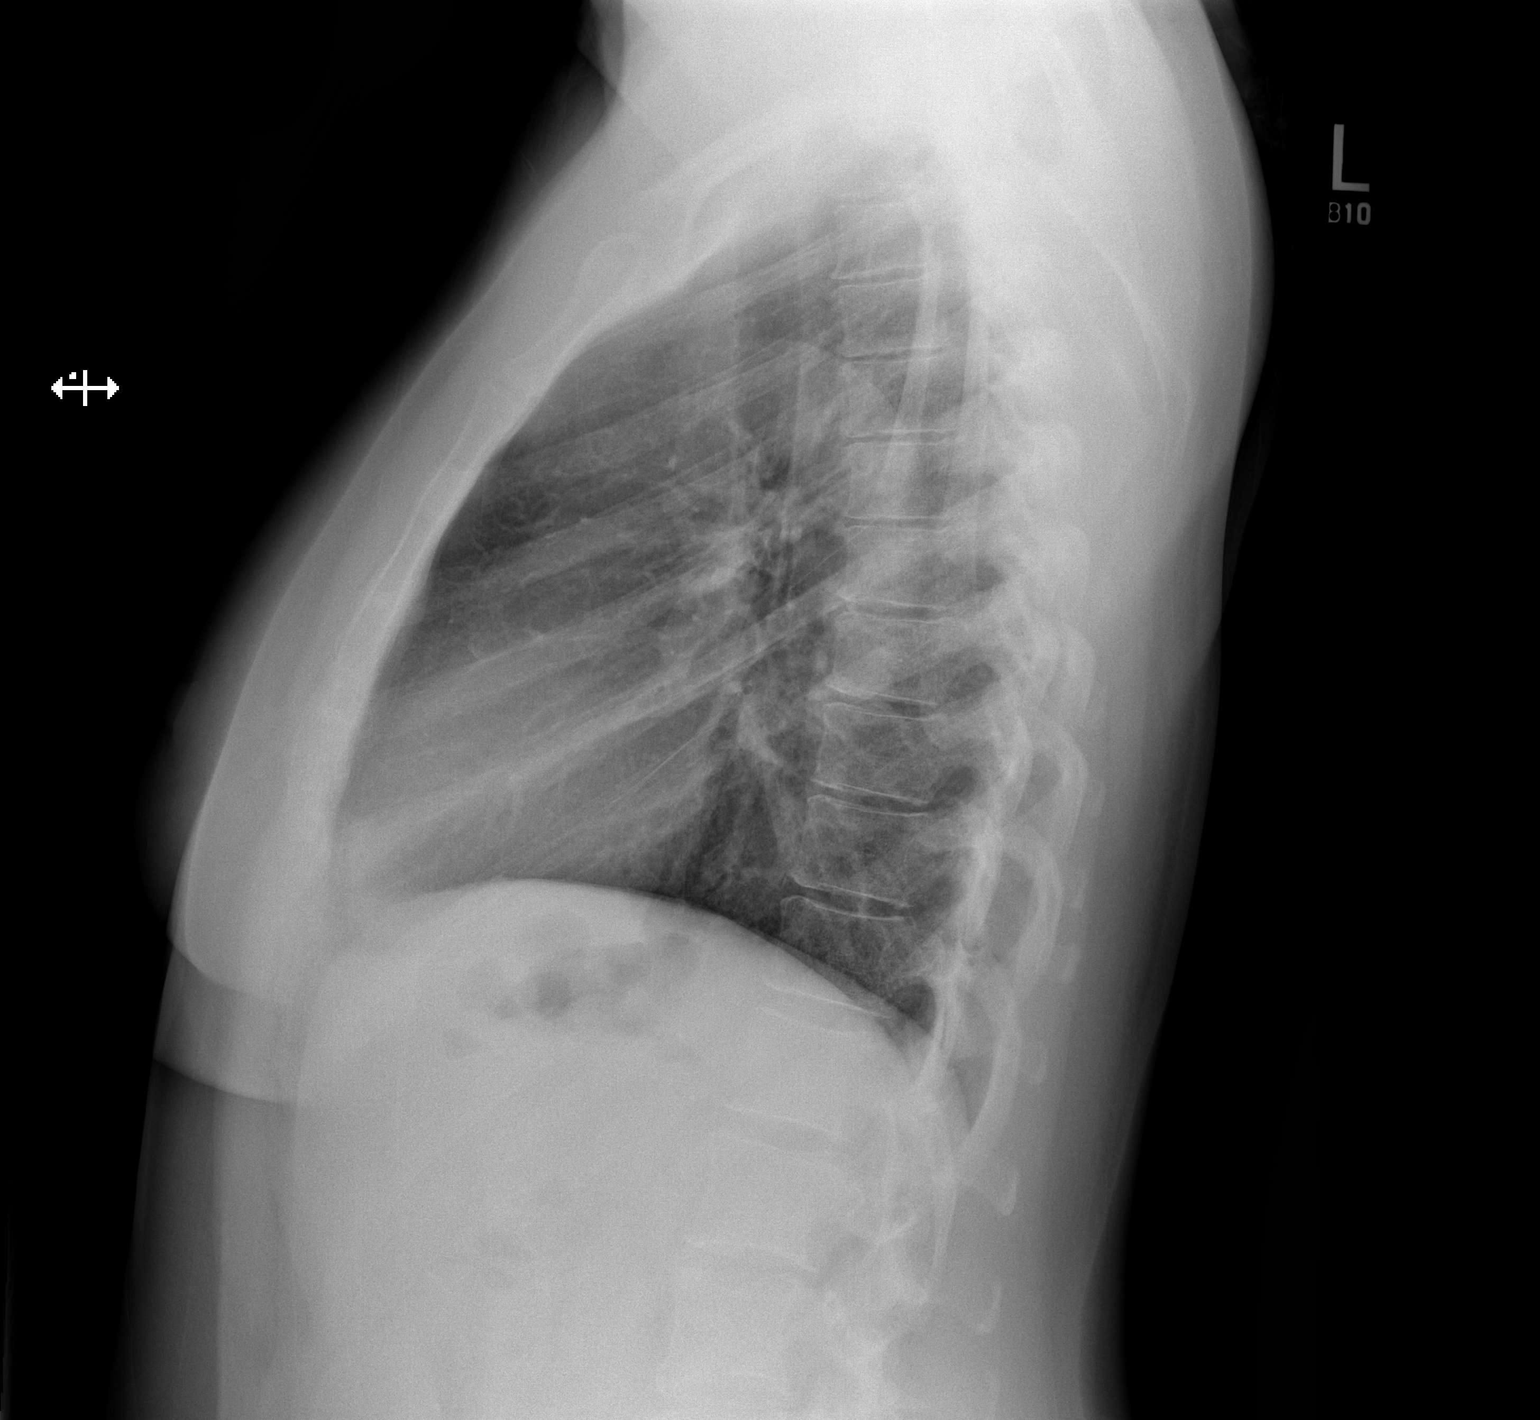

[2 of 2 positions shown; findings below may reference images not displayed]

FINDINGS: The heart size and mediastinal contours are within normal limits.
Both lungs are clear. The visualized skeletal structures are
unremarkable.
IMPRESSION: No active cardiopulmonary disease.

## 2019-07-19 ENCOUNTER — Other Ambulatory Visit: Payer: Self-pay

## 2019-07-19 MED ORDER — METOPROLOL SUCCINATE ER 25 MG PO TB24: ORAL_TABLET | ORAL | 0 refills | Status: DC

## 2019-09-14 LAB — HM DIABETES EYE EXAM

## 2019-09-30 ENCOUNTER — Other Ambulatory Visit: Payer: Self-pay

## 2019-09-30 DIAGNOSIS — Z20822 Contact with and (suspected) exposure to covid-19: Secondary | ICD-10-CM

## 2019-10-02 LAB — NOVEL CORONAVIRUS, NAA: SARS-CoV-2, NAA: NOT DETECTED

## 2019-10-04 ENCOUNTER — Ambulatory Visit: Payer: Managed Care, Other (non HMO) | Admitting: Family Medicine

## 2019-10-04 ENCOUNTER — Encounter: Payer: Self-pay | Admitting: Family Medicine

## 2019-10-04 ENCOUNTER — Other Ambulatory Visit: Payer: Self-pay

## 2019-10-04 VITALS — BP 118/82 | HR 87 | Temp 97.9°F | Resp 14 | Ht 62.0 in | Wt 157.0 lb

## 2019-10-04 DIAGNOSIS — Z5181 Encounter for therapeutic drug level monitoring: Secondary | ICD-10-CM

## 2019-10-04 DIAGNOSIS — E785 Hyperlipidemia, unspecified: Secondary | ICD-10-CM

## 2019-10-04 DIAGNOSIS — Z1231 Encounter for screening mammogram for malignant neoplasm of breast: Secondary | ICD-10-CM | POA: Diagnosis not present

## 2019-10-04 DIAGNOSIS — Z0000 Encounter for general adult medical examination without abnormal findings: Secondary | ICD-10-CM | POA: Diagnosis not present

## 2019-10-04 DIAGNOSIS — I10 Essential (primary) hypertension: Secondary | ICD-10-CM

## 2019-10-04 DIAGNOSIS — E119 Type 2 diabetes mellitus without complications: Secondary | ICD-10-CM | POA: Diagnosis not present

## 2019-10-04 DIAGNOSIS — Z23 Encounter for immunization: Secondary | ICD-10-CM

## 2019-10-04 DIAGNOSIS — M25511 Pain in right shoulder: Secondary | ICD-10-CM

## 2019-10-04 DIAGNOSIS — G4700 Insomnia, unspecified: Secondary | ICD-10-CM

## 2019-10-04 DIAGNOSIS — H9193 Unspecified hearing loss, bilateral: Secondary | ICD-10-CM

## 2019-10-04 LAB — POCT UA - MICROALBUMIN: Microalbumin Ur, POC: NEGATIVE mg/L

## 2019-10-04 NOTE — Patient Instructions (Signed)
Preventing Osteoporosis, Adult Osteoporosis is a condition that causes the bones to get weaker. With osteoporosis, the bones become thinner, and the normal spaces in bone tissue become larger. This can make the bones weak and cause them to break more easily. People who have osteoporosis are more likely to break their wrist, spine, or hip. Even a minor accident or injury can be enough to break weak bones. Osteoporosis can occur with aging. Your body constantly replaces old bone tissue with new tissue. As you get older, you may lose bone tissue more quickly, or it may be replaced more slowly. Osteoporosis is more likely to develop if you have poor nutrition or do not get enough calcium or vitamin D. Other lifestyle factors can also play a role. By making some diet and lifestyle changes, you can help to keep your bones healthy and help to prevent osteoporosis. What nutrition changes can be made? Nutrition plays an important role in maintaining healthy, strong bones.  Make sure you get enough calcium every day from food or from calcium supplements. ? If you are age 64 or younger, aim to get 1,000 mg of calcium every day. ? If you are older than age 31, aim to get 1,200 mg of calcium every day.  Try to get enough vitamin D every day. ? If you are age 42 or younger, aim to get 600 international units (IU) every day. ? If you are older than age 56, aim to get 800 international units every day.  Follow a healthy diet. Eat plenty of foods that contain calcium and vitamin D. ? Calcium is in milk, cheese, yogurt, and other dairy products. Some fish and vegetables are also good sources of calcium. Many foods such as cereals and breads have had calcium added to them (are fortified). Check nutrition labels to see how much calcium is in a food or drink. ? Foods that contain vitamin D include milk, cereals, salmon, and tuna. Your body also makes vitamin D when you are out in the sun. Bare skin exposure to the sun on  your face, arms, legs, or back for no more than 30 minutes a day, 2 times per week is more than enough. Beyond that, it is important to use sunblock to protect your skin from sunburn, which increases your risk for skin cancer. What lifestyle changes can be made? Making changes in your everyday life can also play an important role in preventing osteoporosis.  Stay active and get exercise every day. Ask your health care provider what types of exercise are best for you.  Do not use any products that contain nicotine or tobacco, such as cigarettes and e-cigarettes. If you need help quitting, ask your health care provider.  Limit alcohol intake to no more than 1 drink a day for nonpregnant women and 2 drinks a day for men. One drink equals 12 oz of beer, 5 oz of wine, or 1 oz of hard liquor. Why are these changes important? Making these nutrition and lifestyle changes can:  Help you develop and maintain healthy, strong bones.  Prevent loss of bone mass and the problems that are caused by that loss, such as broken bones and delayed healing.  Make you feel better mentally and physically. What can happen if changes are not made? Problems that can result from osteoporosis can be very serious. These may include:  A higher risk of broken bones that are painful and do not heal well.  Physical malformations, such as a collapsed spine  or a hunched back.  Problems with movement. Where to find support If you need help making changes to prevent osteoporosis, talk with your health care provider. You can ask for a referral to a diet and nutrition specialist (dietitian) and a physical therapist. Where to find more information Learn more about osteoporosis from:  NIH Osteoporosis and Related Baggs: www.niams.GolfingGoddess.com.br  [place] Office on Women's Health:  SouvenirBaseball.es.html  National Osteoporosis Foundation: ProfilePeek.ch Summary  Osteoporosis is a condition that causes weak bones that are more likely to break.  Eating a healthy diet and making sure you get enough calcium and vitamin D can help prevent osteoporosis.  Other ways to reduce your risk of osteoporosis include getting regular exercise and avoiding alcohol and products that contain nicotine or tobacco. This information is not intended to replace advice given to you by your health care provider. Make sure you discuss any questions you have with your health care provider. Document Released: 10/22/2015 Document Revised: 09/19/2017 Document Reviewed: 06/17/2016 Elsevier Patient Education  2020 [employer]   Preventive Care 87-70 Years Old, Female Preventive care refers to visits with your health care provider and lifestyle choices that can promote health and wellness. This includes:  A yearly physical exam. This may also be called an annual well check.  Regular dental visits and eye exams.  Immunizations.  Screening for certain conditions.  Healthy lifestyle choices, such as eating a healthy diet, getting regular exercise, not using drugs or products that contain nicotine and tobacco, and limiting alcohol use. What can I expect for my preventive care visit? Physical exam Your health care provider will check your:  Height and weight. This may be used to calculate body mass index (BMI), which tells if you are at a healthy weight.  Heart rate and blood pressure.  Skin for abnormal spots. Counseling Your health care provider may ask you questions about your:  Alcohol, tobacco, and drug use.  Emotional well-being.  Home and relationship well-being.  Sexual activity.  Eating habits.  Work and work Statistician.  Method of birth control.  Menstrual cycle.  Pregnancy  history. What immunizations do I need?  Influenza (flu) vaccine  This is recommended every year. Tetanus, diphtheria, and pertussis (Tdap) vaccine  You may need a Td booster every 10 years. Varicella (chickenpox) vaccine  You may need this if you have not been vaccinated. Zoster (shingles) vaccine  You may need this after age 68. Measles, mumps, and rubella (MMR) vaccine  You may need at least one dose of MMR if you were born in 1957 or later. You may also need a second dose. Pneumococcal conjugate (PCV13) vaccine  You may need this if you have certain conditions and were not previously vaccinated. Pneumococcal polysaccharide (PPSV23) vaccine  You may need one or two doses if you smoke cigarettes or if you have certain conditions. Meningococcal conjugate (MenACWY) vaccine  You may need this if you have certain conditions. Hepatitis A vaccine  You may need this if you have certain conditions or if you travel or work in places where you may be exposed to hepatitis A. Hepatitis B vaccine  You may need this if you have certain conditions or if you travel or work in places where you may be exposed to hepatitis B. Haemophilus influenzae type b (Hib) vaccine  You may need this if you have certain conditions. Human papillomavirus (HPV) vaccine  If recommended by your health care provider, you may need three doses over 6  months. You may receive vaccines as individual doses or as more than one vaccine together in one shot (combination vaccines). Talk with your health care provider about the risks and benefits of combination vaccines. What tests do I need? Blood tests  Lipid and cholesterol levels. These may be checked every 5 years, or more frequently if you are over 13 years old.  Hepatitis C test.  Hepatitis B test. Screening  Lung cancer screening. You may have this screening every year starting at age 50 if you have a 30-pack-year history of smoking and currently smoke or  have quit within the past 15 years.  Colorectal cancer screening. All adults should have this screening starting at age 76 and continuing until age 47. Your health care provider may recommend screening at age 35 if you are at increased risk. You will have tests every 1-10 years, depending on your results and the type of screening test.  Diabetes screening. This is done by checking your blood sugar (glucose) after you have not eaten for a while (fasting). You may have this done every 1-3 years.  Mammogram. This may be done every 1-2 years. Talk with your health care provider about when you should start having regular mammograms. This may depend on whether you have a family history of breast cancer.  BRCA-related cancer screening. This may be done if you have a family history of breast, ovarian, tubal, or peritoneal cancers.  Pelvic exam and Pap test. This may be done every 3 years starting at age 32. Starting at age 8, this may be done every 5 years if you have a Pap test in combination with an HPV test. Other tests  Sexually transmitted disease (STD) testing.  Bone density scan. This is done to screen for osteoporosis. You may have this scan if you are at high risk for osteoporosis. Follow these instructions at home: Eating and drinking  Eat a diet that includes fresh fruits and vegetables, whole grains, lean protein, and low-fat dairy.  Take vitamin and mineral supplements as recommended by your health care provider.  Do not drink alcohol if: ? Your health care provider tells you not to drink. ? You are pregnant, may be pregnant, or are planning to become pregnant.  If you drink alcohol: ? Limit how much you have to 0-1 drink a day. ? Be aware of how much alcohol is in your drink. In the [place] one drink equals one 12 oz bottle of beer (355 mL), one 5 oz glass of wine (148 mL), or one 1 oz glass of hard liquor (44 mL). Lifestyle  Take daily care of your teeth and gums.  Stay  active. Exercise for at least 30 minutes on 5 or more days each week.  Do not use any products that contain nicotine or tobacco, such as cigarettes, e-cigarettes, and chewing tobacco. If you need help quitting, ask your health care provider.  If you are sexually active, practice safe sex. Use a condom or other form of birth control (contraception) in order to prevent pregnancy and STIs (sexually transmitted infections).  If told by your health care provider, take low-dose aspirin daily starting at age 39. What's next?  Visit your health care provider once a year for a well check visit.  Ask your health care provider how often you should have your eyes and teeth checked.  Stay up to date on all vaccines. This information is not intended to replace advice given to you by your health care provider. Make  you discuss any questions you have with your health care provider. Document Released: 11/03/2015 Document Revised: 06/18/2018 Document Reviewed: 06/18/2018 Elsevier Patient Education  2020 [employer].  

## 2019-10-04 NOTE — Progress Notes (Signed)
Patient: Samantha Stephens, Female    DOB: 05-07-1961, 58 y.o.   MRN: [id number] Arnetha Courser, MD Visit Date: 10/04/2019  Today's Provider: Delsa Grana, PA-C   Chief Complaint  Patient presents with  . Annual Exam   Subjective:   Annual physical exam:  Samantha Stephens is a 58 y.o. female who presents today for complete physical exam:  Exercise/Activity:  Every day walking in the park  Diet/nutrition:  Generally healthy, home cooking, not as many sweets or treats as previously  Sleep: well, occasionally uses ambien  Pt goes to cardiology Dr. Clayborn Bigness for palpitations - last visit was a month ago  Concerns about some decreased hearing - in lower tones and can't hear stuff she used to - like her daughters voice and a bell that was put on their cat . pt also complains of right shoulder pain - stating it was previously evaluated, had xrays and continues to have pain and she states was told she had arthritis, she has pain to biceps groove, worse with lifting arm straight up to by her ear.  No past ortho eval, pt or injections, she would like to see ortho - no preference  Diabetes Mellitus Type II: Currently managing with diet/lifestyle Healthy diet, decreased sweets, mostly cooking at home Fasting CBGs typically run around 90-100, not checking very often  No hypoglycemic episodes Denies: Polyuria, polydipsia, polyphagia, vision changes, or neuropathy  Recent pertinent labs: Lab Results  Component Value Date   HGBA1C 6.6 (H) 09/25/2018   HGBA1C 6.8 (H) 05/22/2018   HGBA1C 6.9 (H) 04/15/2018      Component Value Date/Time   NA 140 03/19/2019 1536   NA 143 12/31/2018 1553   NA 138 10/20/2012 1937   K 4.2 03/19/2019 1536   K 3.5 10/20/2012 1937   CL 105 03/19/2019 1536   CL 103 10/20/2012 1937   CO2 24 03/19/2019 1536   CO2 29 10/20/2012 1937   GLUCOSE 77 03/19/2019 1536   GLUCOSE 118 (H) 10/20/2012 1937   BUN 16 03/19/2019 1536   BUN 12 12/31/2018 1553   BUN  13 10/20/2012 1937   CREATININE 0.85 03/19/2019 1536   CALCIUM 9.7 03/19/2019 1536   CALCIUM 8.9 10/20/2012 1937   PROT 7.2 03/19/2019 1536   PROT 7.7 09/25/2018 1621   PROT 8.1 10/20/2012 1937   ALBUMIN 4.7 02/20/2019 0118   ALBUMIN 5.1 09/25/2018 1621   ALBUMIN 4.1 10/20/2012 1937   AST 17 03/19/2019 1536   AST 27 10/20/2012 1937   ALT 15 03/19/2019 1536   ALT 33 10/20/2012 1937   ALKPHOS 70 02/20/2019 0118   ALKPHOS 93 10/20/2012 1937   BILITOT 0.4 03/19/2019 1536   BILITOT 0.3 09/25/2018 1621   BILITOT 0.2 10/20/2012 1937   GFRNONAA 76 03/19/2019 1536   GFRAA 88 03/19/2019 1536   UTD on DM foot exam and eye exam  Hypertension:  Currently managed on norvasc 5 mg Pt reports good med compliance and denies any SE.  No lightheadedness, hypotension, syncope. Blood pressure today is well controlled. BP Readings from Last 3 Encounters:  10/04/19 118/82  04/19/19 126/85  03/19/19 122/78  Pt denies CP, SOB, exertional sx, LE edema, palpitation, Ha's, visual disturbances  Hyperlipidemia:  Current Medication Regimen:  Diet controlled, statin intolerance in the past Last Lipids: Lab Results  Component Value Date   CHOL 164 12/31/2018   HDL 55 12/31/2018   LDLCALC 82 12/31/2018   TRIG 134 12/31/2018   CHOLHDL 3.0  12/31/2018  The 10-year ASCVD risk score Mikey Bussing DC Brooke Bonito., et al., 2013) is: 9.7%   Values used to calculate the score:     Age: 23 years     Sex: Female     Is Non-Hispanic African American: Yes     Diabetic: Yes     Tobacco smoker: No     Systolic Blood Pressure: 537 mmHg     Is BP treated: Yes     HDL Cholesterol: 55 mg/dL     Total Cholesterol: 164 mg/dL  - Denies: Chest pain, shortness of breath, myalgias. - Documented aortic atherosclerosis? No - Risk factors for atherosclerosis: diabetes mellitus, hypercholesterolemia and hypertension  USPSTF grade A and B recommendations - reviewed and addressed today  Depression:  Phq 9 completed today by patient,  was reviewed by me with patient in the room, score is  negative, pt feels good, denies depression or poor moods PHQ 2/9 Scores 10/04/2019 04/19/2019 03/19/2019 02/17/2019  PHQ - 2 Score 0 0 0 0  PHQ- 9 Score 0 0 0 0  Exception Documentation - - - -  Not completed - - - -   Depression screen [facility] 2/9 10/04/2019 04/19/2019 03/19/2019 02/17/2019 11/20/2018  Decreased Interest 0 0 0 0 0  Down, Depressed, Hopeless 0 0 0 0 2  PHQ - 2 Score 0 0 0 0 2  Altered sleeping 0 0 0 0 1  Tired, decreased energy 0 0 0 0 1  Change in appetite 0 0 0 0 0  Feeling bad or failure about yourself  0 0 0 0 0  Trouble concentrating 0 0 0 0 1  Moving slowly or fidgety/restless 0 0 0 0 0  Suicidal thoughts 0 0 0 0 0  PHQ-9 Score 0 0 0 0 5  Difficult doing work/chores Not difficult at all Not difficult at all Not difficult at all Not difficult at all Not difficult at all  Some recent data might be hidden   Alcohol screening:   Office Visit from 10/04/2019 in [facility]  AUDIT-C Score  0      Immunizations and Health Maintenance: Health Maintenance  Topic Date Due  . HEMOGLOBIN A1C  03/27/2019  . URINE MICROALBUMIN  05/07/2019  . INFLUENZA VACCINE  05/22/2019  . FOOT EXAM  12/15/2019  . MAMMOGRAM  12/23/2019  . OPHTHALMOLOGY EXAM  09/13/2020  . COLONOSCOPY  06/18/2027  . TETANUS/TDAP  08/30/2027  . PNEUMOCOCCAL POLYSACCHARIDE VACCINE AGE 77-64 HIGH RISK  Completed  . Hepatitis C Screening  Completed  . HIV Screening  Completed  . PAP SMEAR-Modifier  Discontinued     Hep C Screening: completed  STD testing and prevention (HIV/chl/gon/syphilis): done, declines any repeat testing  Intimate partner violence:  denies  Sexual History/Pain during Intercourse: Widowed  Menstrual History/LMP/Abnormal Bleeding:   No abnormal vaginal bleeding No LMP recorded. Patient has had a hysterectomy.  Incontinence Symptoms: none  Breast cancer:  Last Mammogram: 12/2018 BRCA gene screening:  n/a  Cervical cancer screening: d/c hysterecomy Family hx of cancers - breast, ovarian, uterine, colon:   Pt has colon cancer in father - dx at young age - 35's maybe  Osteoporosis:   Discussed high calcium and vitamin D supplementation, weight bearing exercises Pt is supplementing with multivitamin  Skin cancer:  Hx of skin CA -  NO Discussed atypical lesions   Colorectal cancer:   colonoscopy is done 2018  Lung cancer:   Low Dose CT Chest recommended if Age 34-80  years, 30 pack-year currently smoking OR have quit w/in 15years. Patient does not qualify.   Social History   Tobacco Use  . Smoking status: Former Research scientist (life sciences)  . Smokeless tobacco: Never Used  . Tobacco comment: but only for a short amount of time  Substance Use Topics  . Alcohol use: Yes    Alcohol/week: 0.0 - 1.0 standard drinks    Comment: occasional glass of wine     ECG:  Done in the past - sees cardiology  Blood pressure/Hypertension: BP Readings from Last 3 Encounters:  10/04/19 118/82  04/19/19 126/85  03/19/19 122/78    Weight/Obesity: Wt Readings from Last 3 Encounters:  10/04/19 157 lb (71.2 kg)  04/19/19 144 lb 12.8 oz (65.7 kg)  03/19/19 142 lb 6.4 oz (64.6 kg)   BMI Readings from Last 3 Encounters:  10/04/19 28.72 kg/m  04/19/19 24.85 kg/m  03/19/19 24.44 kg/m     Lipids:  Lab Results  Component Value Date   CHOL 164 12/31/2018   CHOL 235 (H) 09/25/2018   CHOL 135 05/22/2018   Lab Results  Component Value Date   HDL 55 12/31/2018   HDL 62 09/25/2018   HDL 52 05/22/2018   Lab Results  Component Value Date   LDLCALC 82 12/31/2018   LDLCALC 135 (H) 09/25/2018   LDLCALC 66 05/22/2018   Lab Results  Component Value Date   TRIG 134 12/31/2018   TRIG 190 (H) 09/25/2018   TRIG 87 05/22/2018   Lab Results  Component Value Date   CHOLHDL 3.0 12/31/2018   CHOLHDL 3.8 09/25/2018   CHOLHDL 2.6 05/22/2018   No results found for: LDLDIRECT Based on the results of lipid panel  his/her cardiovascular risk factor ( using [place] )  in the next 10 years is: The 10-year ASCVD risk score Mikey Bussing DC Brooke Bonito., et al., 2013) is: 9.7%   Values used to calculate the score:     Age: 50 years     Sex: Female     Is Non-Hispanic African American: Yes     Diabetic: Yes     Tobacco smoker: No     Systolic Blood Pressure: 283 mmHg     Is BP treated: Yes     HDL Cholesterol: 55 mg/dL     Total Cholesterol: 164 mg/dL  Glucose:  Glucose  Date Value Ref Range Status  12/31/2018 102 (H) 65 - 99 mg/dL Final  10/20/2012 118 (H) 65 - 99 mg/dL Final   Glucose, Bld  Date Value Ref Range Status  03/19/2019 77 65 - 99 mg/dL Final    Comment:    .            Fasting reference interval .   02/20/2019 136 (H) 70 - 99 mg/dL Final  12/10/2018 119 (H) 70 - 99 mg/dL Final   Glucose-Capillary  Date Value Ref Range Status  11/21/2016 77 65 - 99 mg/dL Final      [facility] Visit from 10/04/2019 in [facility]  AUDIT-C Score  0     Depression: Phq 9 is  negative Depression screen [facility] 2/9 10/04/2019 04/19/2019 03/19/2019 02/17/2019 11/20/2018  Decreased Interest 0 0 0 0 0  Down, Depressed, Hopeless 0 0 0 0 2  PHQ - 2 Score 0 0 0 0 2  Altered sleeping 0 0 0 0 1  Tired, decreased energy 0 0 0 0 1  Change in appetite 0 0 0 0 0  Feeling bad or failure about yourself  0 0 0 0 0  Trouble concentrating 0 0 0 0 1  Moving slowly or fidgety/restless 0 0 0 0 0  Suicidal thoughts 0 0 0 0 0  PHQ-9 Score 0 0 0 0 5  Difficult doing work/chores Not difficult at all Not difficult at all Not difficult at all Not difficult at all Not difficult at all  Some recent data might be hidden   Hypertension: BP Readings from Last 3 Encounters:  10/04/19 118/82  04/19/19 126/85  03/19/19 122/78   Obesity: Wt Readings from Last 3 Encounters:  10/04/19 157 lb (71.2 kg)  04/19/19 144 lb 12.8 oz (65.7 kg)  03/19/19 142 lb 6.4 oz (64.6 kg)   BMI Readings from Last 3 Encounters:   10/04/19 28.72 kg/m  04/19/19 24.85 kg/m  03/19/19 24.44 kg/m    Social History      She  reports that she has quit smoking. She has never used smokeless tobacco. She reports current alcohol use. She reports that she does not use drugs.       Social History   Socioeconomic History  . Marital status: Widowed    Spouse name: Not on file  . Number of children: 1  . Years of education: Not on file  . Highest education level: Bachelor's degree (e.g., BA, AB, BS)  Occupational History  . Not on file  Tobacco Use  . Smoking status: Former Research scientist (life sciences)  . Smokeless tobacco: Never Used  . Tobacco comment: but only for a short amount of time  Substance and Sexual Activity  . Alcohol use: Yes    Alcohol/week: 0.0 - 1.0 standard drinks    Comment: occasional glass of wine  . Drug use: No  . Sexual activity: Yes    Partners: Male  Other Topics Concern  . Not on file  Social History Narrative   Father is back on Chemo - stated that the situation is not looking good   Social Determinants of Health   Financial Resource Strain:   . Difficulty of Paying Living Expenses: Not on file  Food Insecurity:   . Worried About Charity fundraiser in the Last Year: Not on file  . Ran Out of Food in the Last Year: Not on file  Transportation Needs:   . Lack of Transportation (Medical): Not on file  . Lack of Transportation (Non-Medical): Not on file  Physical Activity:   . Days of Exercise per Week: Not on file  . Minutes of Exercise per Session: Not on file  Stress:   . Feeling of Stress : Not on file  Social Connections:   . Frequency of Communication with Friends and Family: Not on file  . Frequency of Social Gatherings with Friends and Family: Not on file  . Attends Religious Services: Not on file  . Active Member of Clubs or Organizations: Not on file  . Attends Archivist Meetings: Not on file  . Marital Status: Not on file    Family History        Family Status  Relation  Name Status  . Mother  Deceased  . Father  Alive  . Sister  Alive  . Daughter  Alive  . MGM  Deceased  . MGF  Deceased  . PGM  Deceased  . PGF  Deceased  . Sister  Alive  . Other  (Not Specified)        Her family history includes Arthritis in her mother; Asthma in her sister; Breast  cancer in an other family member; COPD in her mother; Cancer in her father and paternal grandmother; Cataracts in her father and mother; Depression in her mother; Diabetes in her mother; Hearing loss in her father; Heart disease in her mother; Hyperlipidemia in her mother; Hypertension in her mother; Kidney disease in her father; Mental illness in her sister.       Family History  Problem Relation Age of Onset  . Arthritis Mother   . COPD Mother   . Depression Mother   . Diabetes Mother   . Hyperlipidemia Mother   . Hypertension Mother   . Cataracts Mother   . Heart disease Mother   . Cancer Father        kidney and prostate cancer, colon cancer  . Hearing loss Father   . Kidney disease Father   . Cataracts Father   . Asthma Sister   . Cancer Paternal Grandmother   . Mental illness Sister   . Breast cancer Other     Patient Active Problem List   Diagnosis Date Noted  . DDD (degenerative disc disease), cervical 04/19/2019  . Hyperlipidemia LDL goal <70 12/16/2018  . Type 2 diabetes mellitus ([place]) 04/22/2018  . OSA on CPAP 09/15/2017  . Chronic hip pain, right 09/09/2016  . Numbness of right foot 09/09/2016  . Scoliosis 03/08/2016  . 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 Situational anxiety 10/27/2015  . Hypertension goal BP (blood pressure) < 130/80 08/18/2015  . Insomnia, controlled 08/18/2015    Past Surgical History:  Procedure Laterality Date  . ABDOMINAL HYSTERECTOMY    . CESAREAN SECTION     1  . COLONOSCOPY    . CYSTOSCOPY W/ RETROGRADES Bilateral 11/25/2016   Procedure: CYSTOSCOPY WITH RETROGRADE PYELOGRAM;  Surgeon: Hollice Espy, MD;  Location: [facility] ORS;   Service: Urology;  Laterality: Bilateral;  . EYE SURGERY     lasix eye surgery     Current Outpatient Medications:  .  amLODipine (NORVASC) 5 MG tablet, Take 1 tablet (5 mg total) by mouth daily., Disp: 90 tablet, Rfl: 3 .  glucose blood (ONETOUCH VERIO) test strip, Check fingerstick blood sugars once a day before breakfast; Dx E11.9; LON 99 months, Disp: 100 each, Rfl: 3 .  Lancets (ONETOUCH ULTRASOFT) lancets, Check fingerstick blood sugars once a day before breakfast; Dx E11.9, LON 99 months, Disp: 100 each, Rfl: 3 .  metoprolol succinate (TOPROL-XL) 25 MG 24 hr tablet, TAKE 1 TABLET(25 MG) BY MOUTH DAILY, Disp: 90 tablet, Rfl: 0 .  Multiple Vitamin (MULTIVITAMIN) tablet, Take 1 tablet by mouth daily., Disp: , Rfl:  .  potassium chloride (K-DUR) 10 MEQ tablet, Take 1 tablet (10 mEq total) by mouth daily., Disp: 5 tablet, Rfl: 0 .  zolpidem (AMBIEN CR) 6.25 MG CR tablet, Take 3.125 mg by mouth as needed. , Disp: , Rfl: 2 .  aspirin EC 81 MG tablet, Take 1 tablet (81 mg total) by mouth daily., Disp: , Rfl:  .  Cholecalciferol (VITAMIN D3) 2000 units TABS, Take 1 capsule by mouth daily., Disp: , Rfl:  .  hydrOXYzine (VISTARIL) 25 MG capsule, Take 1 capsule (25 mg total) by mouth 3 (three) times daily as needed for anxiety. (Patient not taking: Reported on 10/04/2019), Disp: 30 capsule, Rfl: 0 .  LORazepam (ATIVAN) 1 MG tablet, Take 1 tablet (1 mg total) by mouth every 8 (eight) hours as needed for anxiety. (Patient not taking: Reported on 10/04/2019), Disp: 15 tablet, Rfl: 0 .  Melatonin  5 MG CAPS, Take 5 mg by mouth daily., Disp: , Rfl:   Allergies  Allergen Reactions  . Trazodone And Nefazodone Other (See Comments)    Syncope; see in ER Feb 2018  . Benicar [Olmesartan] Cough  . Statins     Patient Care Team: Lada, Satira Anis, MD as PCP - General (Family Medicine) Bobetta Lime, MD (Family Medicine)  Review of Systems  Constitutional: Negative.  Negative for activity change,  appetite change, fatigue and unexpected weight change.  HENT: Negative.   Eyes: Negative.   Respiratory: Negative.  Negative for shortness of breath.   Cardiovascular: Negative.  Negative for chest pain, palpitations and leg swelling.  Gastrointestinal: Negative.  Negative for abdominal pain and blood in stool.  Endocrine: Negative.   Genitourinary: Negative.   Musculoskeletal: Negative.  Negative for arthralgias, gait problem, joint swelling and myalgias.  Skin: Negative.  Negative for color change, pallor and rash.  Allergic/Immunologic: Negative.   Neurological: Negative.  Negative for syncope and weakness.  Hematological: Negative.   Psychiatric/Behavioral: Negative.  Negative for confusion, dysphoric mood, self-injury and suicidal ideas. The patient is not nervous/anxious.   All other systems reviewed and are negative.    I personally reviewed active problem list, medication list, allergies, family history, social history, health maintenance, notes from last encounter, lab results, imaging with the patient/caregiver today.        Objective:   Vitals:  Vitals:   10/04/19 0817  BP: 118/82  Pulse: 87  Resp: 14  Temp: 97.9 F (36.6 C)  SpO2: 97%  Weight: 157 lb (71.2 kg)  Height: _0  (1.575 m)    Body mass index is 28.72 kg/m.  Physical Exam Constitutional:      General: She is not in acute distress.    Appearance: Normal appearance. She is well-developed. She is not toxic-appearing or diaphoretic.  HENT:     Head: Normocephalic and atraumatic.     Right Ear: External ear normal.     Left Ear: External ear normal.     Nose: Nose normal.     Mouth/Throat:     Pharynx: Uvula midline.  Eyes:     General: Lids are normal.     Conjunctiva/sclera: Conjunctivae normal.     Pupils: Pupils are equal, round, and reactive to light.  Neck:     Trachea: Phonation normal. No tracheal deviation.  Cardiovascular:     Rate and Rhythm: Normal rate and regular rhythm.      Pulses: Normal pulses.          Radial pulses are 2+ on the right side and 2+ on the left side.       Posterior tibial pulses are 2+ on the right side and 2+ on the left side.     Heart sounds: Normal heart sounds. No murmur. No friction rub. No gallop.   Pulmonary:     Effort: Pulmonary effort is normal. No respiratory distress.     Breath sounds: Normal breath sounds. No stridor. No wheezing, rhonchi or rales.  Chest:     Chest wall: No tenderness.  Abdominal:     General: Bowel sounds are normal. There is no distension.     Palpations: Abdomen is soft.     Tenderness: There is no abdominal tenderness. There is no guarding or rebound.  Musculoskeletal:        General: No deformity. Normal range of motion.     Right shoulder: Tenderness present. No swelling, deformity, effusion or bony  tenderness. Normal range of motion. Normal strength. Normal pulse.     Cervical back: Normal range of motion and neck supple.     Comments: Right shoulder ttp to biceps groove and anterior glenohumeral fossa  Lymphadenopathy:     Cervical: No cervical adenopathy.  Skin:    General: Skin is warm and dry.     Capillary Refill: Capillary refill takes less than 2 seconds.     Coloration: Skin is not pale.     Findings: No rash.  Neurological:     Mental Status: She is alert and oriented to person, place, and time.     Motor: No abnormal muscle tone.     Gait: Gait normal.  Psychiatric:        Speech: Speech normal.        Behavior: Behavior normal.       Fall Risk: Fall Risk  10/04/2019 04/19/2019 03/19/2019 02/23/2019 02/03/2019  Falls in the past year? 0 0 0 0 0  Number falls in past yr: 0 0 - 0 -  Injury with Fall? 0 0 - 0 0    Functional Status Survey:     Assessment & Plan:    CPE completed today  . USPSTF grade A and B recommendations reviewed with patient; age-appropriate recommendations, preventive care, screening tests, etc discussed and encouraged; healthy living encouraged; see  AVS for patient education given to patient  . Discussed importance of 150 minutes of physical activity weekly, AHA exercise recommendations given to pt in AVS/handout  . Discussed importance of healthy diet:  eating lean meats and proteins, avoiding trans fats and saturated fats, avoid simple sugars and excessive carbs in diet, eat 6 servings of fruit/vegetables daily and drink plenty of water and avoid sweet beverages.    . Recommended pt to do annual eye exam and routine dental exams/cleanings  . Depression, alcohol, fall screening completed as documented above and per flowsheets  . Reviewed Health Maintenance: Health Maintenance  Topic Date Due  . HEMOGLOBIN A1C  03/27/2019  . URINE MICROALBUMIN  05/07/2019  . INFLUENZA VACCINE  05/22/2019  . FOOT EXAM  12/15/2019  . MAMMOGRAM  12/23/2019  . OPHTHALMOLOGY EXAM  09/13/2020  . COLONOSCOPY  06/18/2027  . TETANUS/TDAP  08/30/2027  . PNEUMOCOCCAL POLYSACCHARIDE VACCINE AGE 40-64 HIGH RISK  Completed  . Hepatitis C Screening  Completed  . HIV Screening  Completed  . PAP SMEAR-Modifier  Discontinued    . Immunizations: Immunization History  Administered Date(s) Administered  . Influenza,inj,Quad PF,6+ Mos 09/09/2016, 08/29/2017, 09/11/2018  . Pneumococcal Polysaccharide-23 09/11/2018  . Tdap 08/29/2017    1. Adult general medical exam Done today  - CBC w/ Diff - CMP w GFR - Lipid Panel  2. Encounter for screening mammogram for breast cancer - MM 3D SCREEN BREAST BILATERAL; Future  3. Type 2 diabetes mellitus without complication, without long-term current use of insulin (HCC) Diet controlled - watching blood sugars occasionally last fasting was 99 Eyes checked last week - CMP w GFR - A1C - Microalbumin, urine  4. Hyperlipidemia LDL goal <70 Recheck labs - CMP w GFR - Lipid Panel  5. Essential hypertension norvasc 5 mg well controlled - CMP w GFR  6. Insomnia, controlled ambien prn  7. Need for influenza  vaccination - Flu Vaccine QUAD 6+ mos PF IM (Fluarix Quad PF)    ICD-10-CM   1. Adult general medical exam  Z00.00 Lipid Panel    CBC w/ Diff    CANCELED: CBC  w/ Diff    CANCELED: CMP w GFR    CANCELED: Lipid Panel  2. Encounter for screening mammogram for breast cancer  Z12.31 MM 3D SCREEN BREAST BILATERAL  3. Type 2 diabetes mellitus without complication, without long-term current use of insulin (HCC)  E11.9 A1C    CMET - [facility]    POCT UA - Microalbumin    CANCELED: CMP w GFR    CANCELED: A1C    CANCELED: Microalbumin, urine   due for labs, well controlled  4. Hyperlipidemia LDL goal <70  E78.5 Lipid Panel    CMET - [facility]    CANCELED: CMP w GFR    CANCELED: Lipid Panel   compliant with meds, due for labs  5. Essential hypertension  I10 CMET - [facility]    CANCELED: CMP w GFR   well controlled  6. Insomnia, controlled  G47.00    prn use of ambien  7. Need for influenza vaccination  Z23 Flu Vaccine QUAD 6+ mos PF IM (Fluarix Quad PF)  8. Decreased hearing of both ears  H91.93 Hearing screening   hearing test done, normal - discussed tx for ETD antihistamine, steroid nasal sprays and decongestants  9. Right shoulder pain, unspecified chronicity  M25.511 Ambulatory referral to Orthopedics   pt requests ortho referral - chronic intermittent right shoulder pain  10. Encounter for medication monitoring  Z51.81 Lipid Panel    CBC w/ Diff    A1C    CMET - [facility]    POCT UA - Microalbumin       Delsa Grana, PA-C 10/04/19 8:22 AM  [facility]

## 2019-10-13 ENCOUNTER — Other Ambulatory Visit: Payer: Self-pay | Admitting: Family Medicine

## 2019-10-14 LAB — COMPREHENSIVE METABOLIC PANEL
ALT: 21 IU/L (ref 0–32)
AST: 19 IU/L (ref 0–40)
Albumin/Globulin Ratio: 1.8 (ref 1.2–2.2)
Albumin: 4.4 g/dL (ref 3.8–4.9)
Alkaline Phosphatase: 93 IU/L (ref 39–117)
BUN/Creatinine Ratio: 18 (ref 9–23)
BUN: 18 mg/dL (ref 6–24)
Bilirubin Total: 0.3 mg/dL (ref 0.0–1.2)
CO2: 27 mmol/L (ref 20–29)
Calcium: 9.6 mg/dL (ref 8.7–10.2)
Chloride: 101 mmol/L (ref 96–106)
Creatinine, Ser: 0.98 mg/dL (ref 0.57–1.00)
GFR calc Af Amer: 73 mL/min/{1.73_m2} (ref >59)
GFR calc non Af Amer: 63 mL/min/{1.73_m2} (ref >59)
Globulin, Total: 2.4 g/dL (ref 1.5–4.5)
Glucose: 122 mg/dL — ABNORMAL HIGH (ref 65–99)
Potassium: 4.8 mmol/L (ref 3.5–5.2)
Sodium: 140 mmol/L (ref 134–144)
Total Protein: 6.8 g/dL (ref 6.0–8.5)

## 2019-10-14 LAB — CBC WITH DIFFERENTIAL/PLATELET
Basophils Absolute: 0.1 10*3/uL (ref 0.0–0.2)
Basos: 1 %
EOS (ABSOLUTE): 0.5 10*3/uL — ABNORMAL HIGH (ref 0.0–0.4)
Eos: 6 %
Hematocrit: 38.1 % (ref 34.0–46.6)
Hemoglobin: 12.9 g/dL (ref 11.1–15.9)
Immature Grans (Abs): 0 10*3/uL (ref 0.0–0.1)
Immature Granulocytes: 0 %
Lymphocytes Absolute: 3.5 10*3/uL — ABNORMAL HIGH (ref 0.7–3.1)
Lymphs: 44 %
MCH: 26.1 pg — ABNORMAL LOW (ref 26.6–33.0)
MCHC: 33.9 g/dL (ref 31.5–35.7)
MCV: 77 fL — ABNORMAL LOW (ref 79–97)
Monocytes Absolute: 0.5 10*3/uL (ref 0.1–0.9)
Monocytes: 7 %
Neutrophils Absolute: 3.2 10*3/uL (ref 1.4–7.0)
Neutrophils: 42 %
Platelets: 273 10*3/uL (ref 150–450)
RBC: 4.94 x10E6/uL (ref 3.77–5.28)
RDW: 15 % (ref 11.7–15.4)
WBC: 7.8 10*3/uL (ref 3.4–10.8)

## 2019-10-14 LAB — HEMOGLOBIN A1C
Est. average glucose Bld gHb Est-mCnc: 140 mg/dL
Hgb A1c MFr Bld: 6.5 % — ABNORMAL HIGH (ref 4.8–5.6)

## 2019-10-14 LAB — LIPID PANEL
Chol/HDL Ratio: 3.2 ratio (ref 0.0–4.4)
Cholesterol, Total: 214 mg/dL — ABNORMAL HIGH (ref 100–199)
HDL: 66 mg/dL (ref >39)
LDL Chol Calc (NIH): 124 mg/dL — ABNORMAL HIGH (ref 0–99)
Triglycerides: 135 mg/dL (ref 0–149)
VLDL Cholesterol Cal: 24 mg/dL (ref 5–40)

## 2019-11-12 DIAGNOSIS — M169 Osteoarthritis of hip, unspecified: Secondary | ICD-10-CM | POA: Insufficient documentation

## 2019-12-02 ENCOUNTER — Emergency Department: Payer: Managed Care, Other (non HMO)

## 2019-12-02 ENCOUNTER — Emergency Department
Admission: EM | Admit: 2019-12-02 | Discharge: 2019-12-02 | Disposition: A | Discharge status: Home / Self Care | Payer: Managed Care, Other (non HMO) | Attending: Emergency Medicine | Admitting: Emergency Medicine

## 2019-12-02 ENCOUNTER — Other Ambulatory Visit: Payer: Self-pay

## 2019-12-02 DIAGNOSIS — E119 Type 2 diabetes mellitus without complications: Secondary | ICD-10-CM | POA: Insufficient documentation

## 2019-12-02 DIAGNOSIS — Z7982 Long term (current) use of aspirin: Secondary | ICD-10-CM | POA: Insufficient documentation

## 2019-12-02 DIAGNOSIS — Z79899 Other long term (current) drug therapy: Secondary | ICD-10-CM | POA: Diagnosis not present

## 2019-12-02 DIAGNOSIS — Z87891 Personal history of nicotine dependence: Secondary | ICD-10-CM | POA: Diagnosis not present

## 2019-12-02 DIAGNOSIS — I10 Essential (primary) hypertension: Secondary | ICD-10-CM | POA: Insufficient documentation

## 2019-12-02 DIAGNOSIS — G43809 Other migraine, not intractable, without status migrainosus: Secondary | ICD-10-CM | POA: Diagnosis not present

## 2019-12-02 DIAGNOSIS — R519 Headache, unspecified: Secondary | ICD-10-CM | POA: Diagnosis present

## 2019-12-02 NOTE — ED Notes (Signed)
See triage note. Pt here with HA that started yesterday on the right side on her head and has gotten progressively worse today.

## 2019-12-02 NOTE — ED Triage Notes (Signed)
Pt comes via pOV from home with c/o severe headache. Pt states this started yesterday and has since gotten worse.  Pt denies any blurred vision, SOB or CP.  Pt states it might be a tension headache but she is not sure.

## 2019-12-02 NOTE — ED Provider Notes (Signed)
[facility] Emergency Department Provider Note  ____________________________________________  Time seen: Approximately 5:35 PM  I have reviewed the triage vital signs and the nursing notes.   HISTORY  Chief Complaint Headache    HPI Samantha Stephens is a 59 y.o. female that presents to the emergency department for evaluation of right-sided headache for 2 days.  She does not regularly get headaches.  Headache has been fairly constant.  It has not been worsening throughout the day.  Patient states that it has actually improved while sitting in the emergency department with the lights off.  Lights are semi bothering her but she is able to look at lights and leave the light on.  No trauma.  No dizziness or visual changes.  She does have some sinus congestion, that is worse on the right.  Patient does have a history of anxiety.  Patient states that she lost a friend over the weekend due to COVID-19.  She just found out yesterday that she is planning funeral arrangements.  She assumed that the headache was coming from stress due to this but her daughter encouraged her to get checked out.  Patient has taken Tylenol for pain.  She does not request any medications in the emergency department.  She has had no recent illness.  She was recently tested for COVID-19 but states that she regularly gets tested every 2 to 3 months as a precaution, not because she has had any exposures or symptoms.  Patient states that she is very anxious about Covid.  No fevers, neck pain, visual changes, nausea, vomiting.  Past Medical History:  Diagnosis Date  . Arthritis   . Chronic mid back pain 08/18/2015  . 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 Family hx of colon cancer requiring screening colonoscopy 03/08/2016  . Headache   . Hyperlipidemia   . Hypertension   . Hypokalemia   . Palpitations   . Scoliosis 03/08/2016  . Sleep apnea   . Stress headaches     Patient Active  Problem List   Diagnosis Date Noted  . DDD (degenerative disc disease), cervical 04/19/2019  . Hyperlipidemia LDL goal <70 12/16/2018  . Type 2 diabetes mellitus ([place]) 04/22/2018  . OSA on CPAP 09/15/2017  . Chronic hip pain, right 09/09/2016  . Numbness of right foot 09/09/2016  . Scoliosis 03/08/2016  . 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 Situational anxiety 10/27/2015  . Hypertension goal BP (blood pressure) < 130/80 08/18/2015  . Insomnia, controlled 08/18/2015    Past Surgical History:  Procedure Laterality Date  . ABDOMINAL HYSTERECTOMY    . CESAREAN SECTION     1  . COLONOSCOPY    . CYSTOSCOPY W/ RETROGRADES Bilateral 11/25/2016   Procedure: CYSTOSCOPY WITH RETROGRADE PYELOGRAM;  Surgeon: Hollice Espy, MD;  Location: [facility] ORS;  Service: Urology;  Laterality: Bilateral;  . EYE SURGERY     lasix eye surgery    Prior to Admission medications   Medication Sig Start Date End Date Taking? Authorizing Provider  amLODipine (NORVASC) 5 MG tablet Take 1 tablet (5 mg total) by mouth daily. 02/03/19   Arnetha Courser, MD  aspirin EC 81 MG tablet Take 1 tablet (81 mg total) by mouth daily. 09/09/16   Arnetha Courser, MD  Cholecalciferol (VITAMIN D3) 2000 units TABS Take 1 capsule by mouth daily.    [provider]  glucose blood (ONETOUCH VERIO) test strip Check fingerstick blood sugars once a day before breakfast; Dx  E11.9; LON 99 months 06/01/18   Arnetha Courser, MD  Lancets [facility] ULTRASOFT) lancets Check fingerstick blood sugars once a day before breakfast; Dx E11.9, LON 99 months 06/01/18   Arnetha Courser, MD  Melatonin 5 MG CAPS Take 5 mg by mouth daily.    [provider]  Multiple Vitamin (MULTIVITAMIN) tablet Take 1 tablet by mouth daily.    [provider]  potassium chloride (K-DUR) 10 MEQ tablet Take 1 tablet (10 mEq total) by mouth daily. 11/20/18   Delman Kitten, MD    Allergies Trazodone and nefazodone, Benicar [olmesartan],  and Statins  Family History  Problem Relation Age of Onset  . Arthritis Mother   . COPD Mother   . Depression Mother   . Diabetes Mother   . Hyperlipidemia Mother   . Hypertension Mother   . Cataracts Mother   . Heart disease Mother   . Cancer Father        kidney and prostate cancer, colon cancer  . Hearing loss Father   . Kidney disease Father   . Cataracts Father   . Asthma Sister   . Cancer Paternal Grandmother   . Mental illness Sister   . Breast cancer Other     Social History Social History   Tobacco Use  . Smoking status: Former Research scientist (life sciences)  . Smokeless tobacco: Never Used  . Tobacco comment: but only for a short amount of time  Substance Use Topics  . Alcohol use: Yes    Alcohol/week: 0.0 - 1.0 standard drinks    Comment: occasional glass of wine  . Drug use: No     Review of Systems  Constitutional: No fever/chills ENT: Positive for mild sinus congestion. Cardiovascular: No chest pain. Respiratory: No SOB. Gastrointestinal: No abdominal pain.  No nausea, no vomiting.  Musculoskeletal: Negative for musculoskeletal pain. Skin: Negative for rash, abrasions, lacerations, ecchymosis. Neurological: Negative for numbness or tingling.  Positive for headache.   ____________________________________________   PHYSICAL EXAM:  VITAL SIGNS: ED Triage Vitals  Enc Vitals Group     BP 12/02/19 1627 (!) 159/90     Pulse Rate 12/02/19 1627 90     Resp 12/02/19 1627 18     Temp 12/02/19 1627 98.1 F (36.7 C)     Temp src --      SpO2 12/02/19 1627 99 %     Weight 12/02/19 1626 155 lb (70.3 kg)     Height 12/02/19 1626 5' 4"  (1.626 m)     Head Circumference --      Peak Flow --      Pain Score 12/02/19 1626 10     Pain Loc --      Pain Edu? --      Excl. in [place]? --      Constitutional: Alert and oriented. Well appearing and in no acute distress. Eyes: Conjunctivae are normal. PERRL. EOMI. Head: Atraumatic. No tenderness to palpation to temporals. ENT:       Ears:      Nose: No congestion/rhinnorhea.      Mouth/Throat: Mucous membranes are moist.  Neck: No stridor. Cardiovascular: Normal rate, regular rhythm.  Good peripheral circulation. Respiratory: Normal respiratory effort without tachypnea or retractions. Lungs CTAB. Good air entry to the bases with no decreased or absent breath sounds. Gastrointestinal: Bowel sounds 4 quadrants. Soft and nontender to palpation. No guarding or rigidity. No palpable masses. No distention.  Musculoskeletal: Full range of motion to all extremities. No gross deformities appreciated.  Neurologic: Normal speech and language. No gross focal neurologic deficits are appreciated.  Cranial nerves: 2-10 normal as tested. Strength 5/5 in upper and lower extremities Cerebellar: Finger-nose-finger WNL, Heel to shin WNL Sensorimotor: No pronator drift, clonus, sensory loss or abnormal reflexes. No vision deficits noted to confrontation bilaterally.  Speech: No dysarthria or expressive aphasia Skin:  Skin is warm, dry and intact. No rash noted. Psychiatric: Mood and affect are normal. Speech and behavior are normal. Patient exhibits appropriate insight and judgement.   ____________________________________________   LABS (all labs ordered are listed, but only abnormal results are displayed)  Labs Reviewed - No data to display ____________________________________________  EKG   ____________________________________________  RADIOLOGY Robinette Haines, personally viewed and evaluated these images (plain radiographs) as part of my medical decision making, as well as reviewing the written report by the radiologist.  CT Head Wo Contrast  Result Date: 12/02/2019 CLINICAL DATA:  Headache, chronic, new features or increased frequency. Patient reports progressive right-sided headache since yesterday. EXAM: CT HEAD WITHOUT CONTRAST TECHNIQUE: Contiguous axial images were obtained from the base of the skull through the vertex  without intravenous contrast. COMPARISON:  Head CT 04/09/2014 FINDINGS: Brain: No intracranial hemorrhage, mass effect, or midline shift. No hydrocephalus. The basilar cisterns are patent. No evidence of territorial infarct or acute ischemia. No extra-axial or intracranial fluid collection. Vascular: No hyperdense vessel or unexpected calcification. Skull: No fracture or focal lesion. Sinuses/Orbits: Paranasal sinuses and mastoid air cells are clear. The visualized orbits are unremarkable. Other: None. IMPRESSION: Unremarkable noncontrast head CT. Electronically Signed   By: Keith Rake M.D.   On: 12/02/2019 18:02    ____________________________________________    PROCEDURES  Procedure(s) performed:    Procedures    Medications - No data to display   ____________________________________________   INITIAL IMPRESSION / ASSESSMENT AND PLAN / ED COURSE  Pertinent labs & imaging results that were available during my care of the patient were reviewed by me and considered in my medical decision making (see chart for details).  Review of the [place] CSRS was performed in accordance of the [place] prior to dispensing any controlled drugs.   Differential diagnosis includes, but is not limited to, intracranial hemorrhage, meningitis/encephalitis, previous head trauma,  tension headache, temporal arteritis, migraine or migraine equivalent, idiopathic intracranial hypertension, and non-specific headache.   Patient presented to emergency department for evaluation of headache that started yesterday.  Vital signs and exam are reassuring.  Neuro exam is unremarkable.  CT head negative for acute abnormalities.  No fever or neck pain to suggest infectious etiology. No fever, vision changes or pain to temporal artery to suggest GCA.  Patient declines any pain medications in the emergency department and would like to go home and rest.  Patient declines any symptomatic medications in the emergency  department.  Patient is to follow up with PCP as directed. Patient is given ED precautions to return to the ED for any worsening or new symptoms.   Samantha Stephens was evaluated in Emergency Department on 12/02/2019 for the symptoms described in the history of present illness. She was evaluated in the context of the global COVID-19 pandemic, which necessitated consideration that the patient might be at risk for infection with the SARS-CoV-2 virus that causes COVID-19. Institutional protocols and algorithms that pertain to the evaluation of patients at risk for COVID-19 are in a state of rapid change based on information released by regulatory bodies including the CDC and federal and state organizations. These policies and  algorithms were followed during the patient's care in the ED.  ____________________________________________  FINAL CLINICAL IMPRESSION(S) / ED DIAGNOSES  Final diagnoses:  Other migraine without status migrainosus, not intractable      NEW MEDICATIONS STARTED DURING THIS VISIT:  ED Discharge Orders    None          This chart was dictated using voice recognition software/Dragon. Despite best efforts to proofread, errors can occur which can change the meaning. Any change was purely unintentional.    Laban Emperor, PA-C 12/02/19 [id number]    Blake Divine, MD 12/02/19 [id number]

## 2019-12-02 NOTE — Discharge Instructions (Signed)
Your CT scan was normal.  Please return to the emergency department for any worsening of symptoms over the weekend.

## 2020-01-06 ENCOUNTER — Ambulatory Visit: Payer: Managed Care, Other (non HMO) | Attending: Internal Medicine

## 2020-01-06 DIAGNOSIS — Z23 Encounter for immunization: Secondary | ICD-10-CM

## 2020-01-06 IMAGING — CR DG CHEST 2V
2 series · 2 of 2 positions shown · non-contrast
Comparison: 11/20/2018

CLINICAL DATA: Palpitations

EXAM:
CHEST - 2 VIEW

[chest pa]
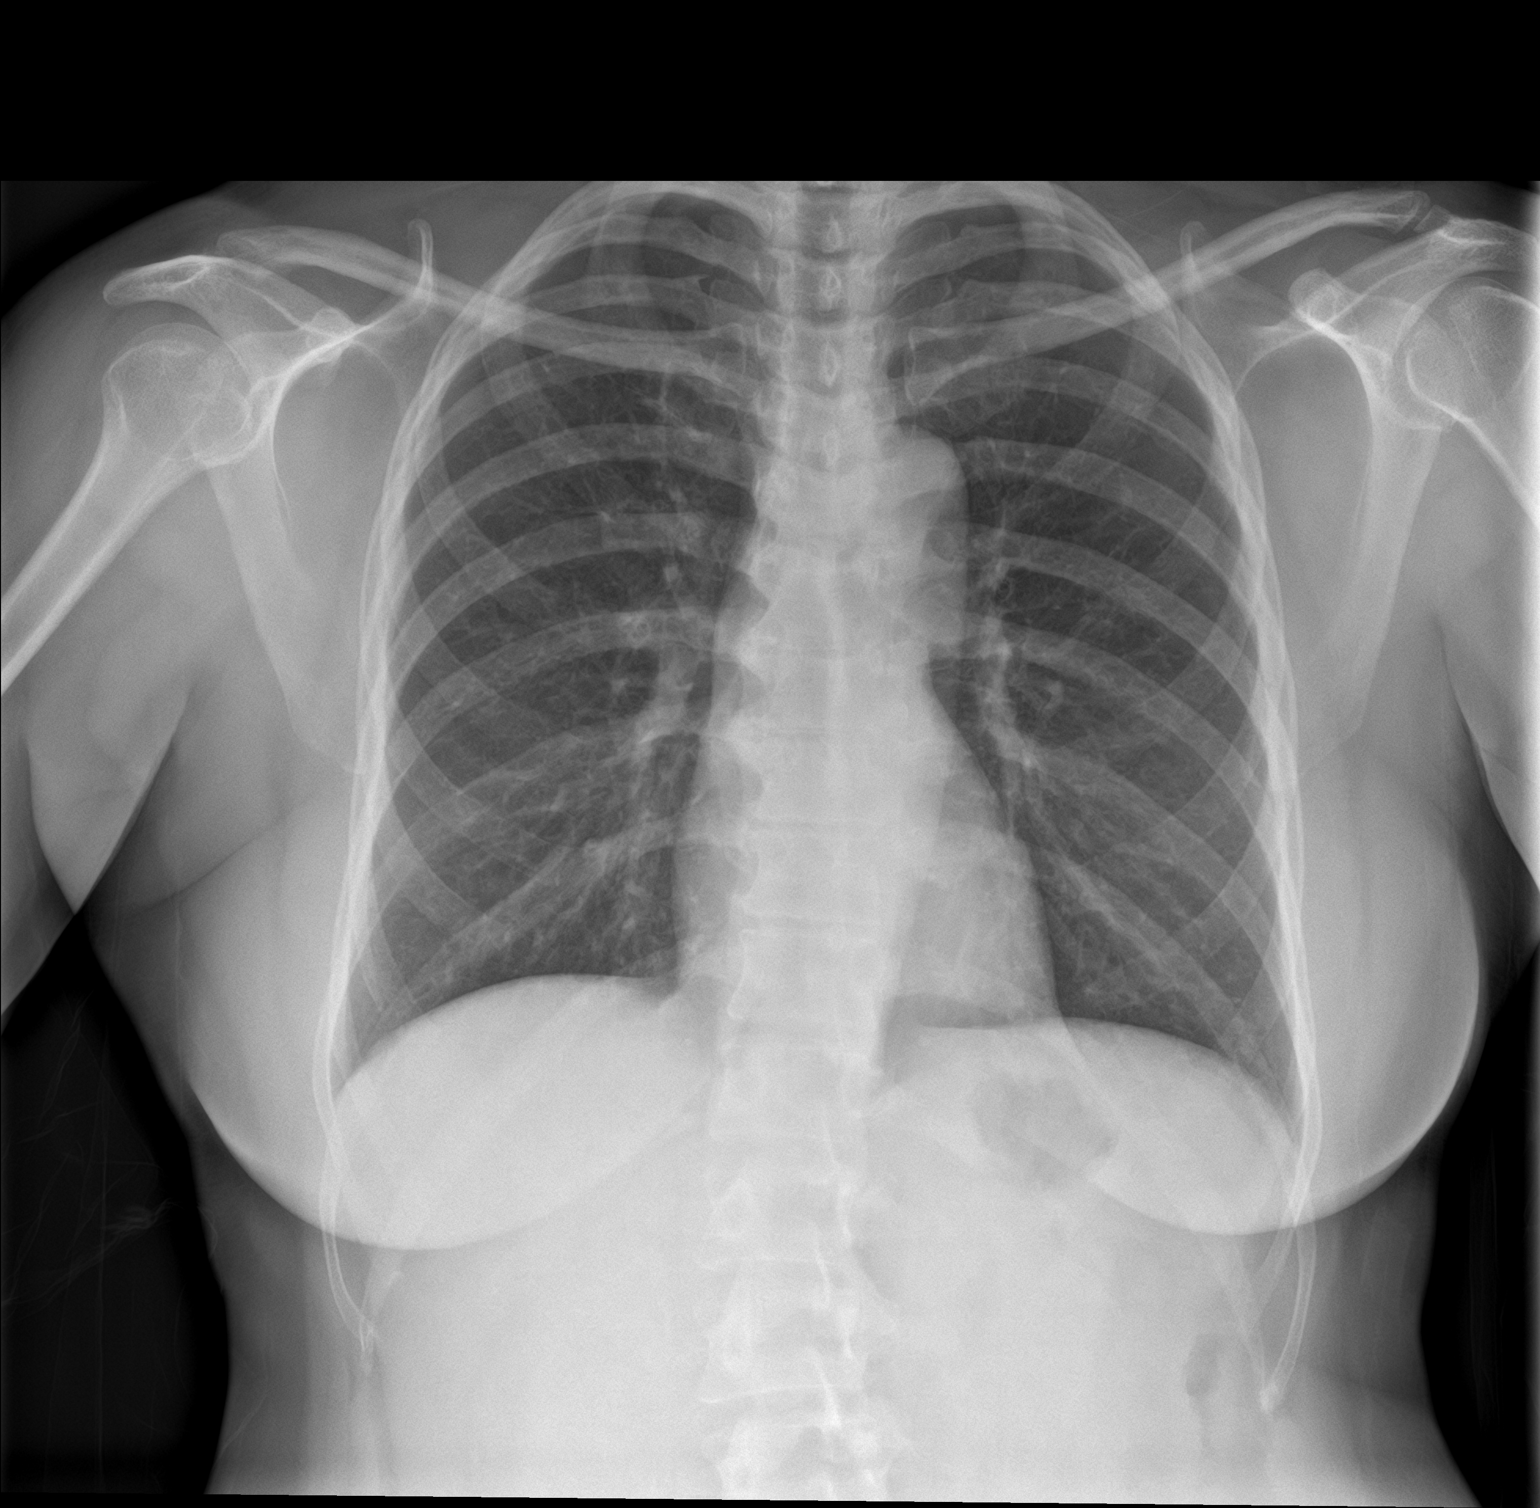

[chest lat]
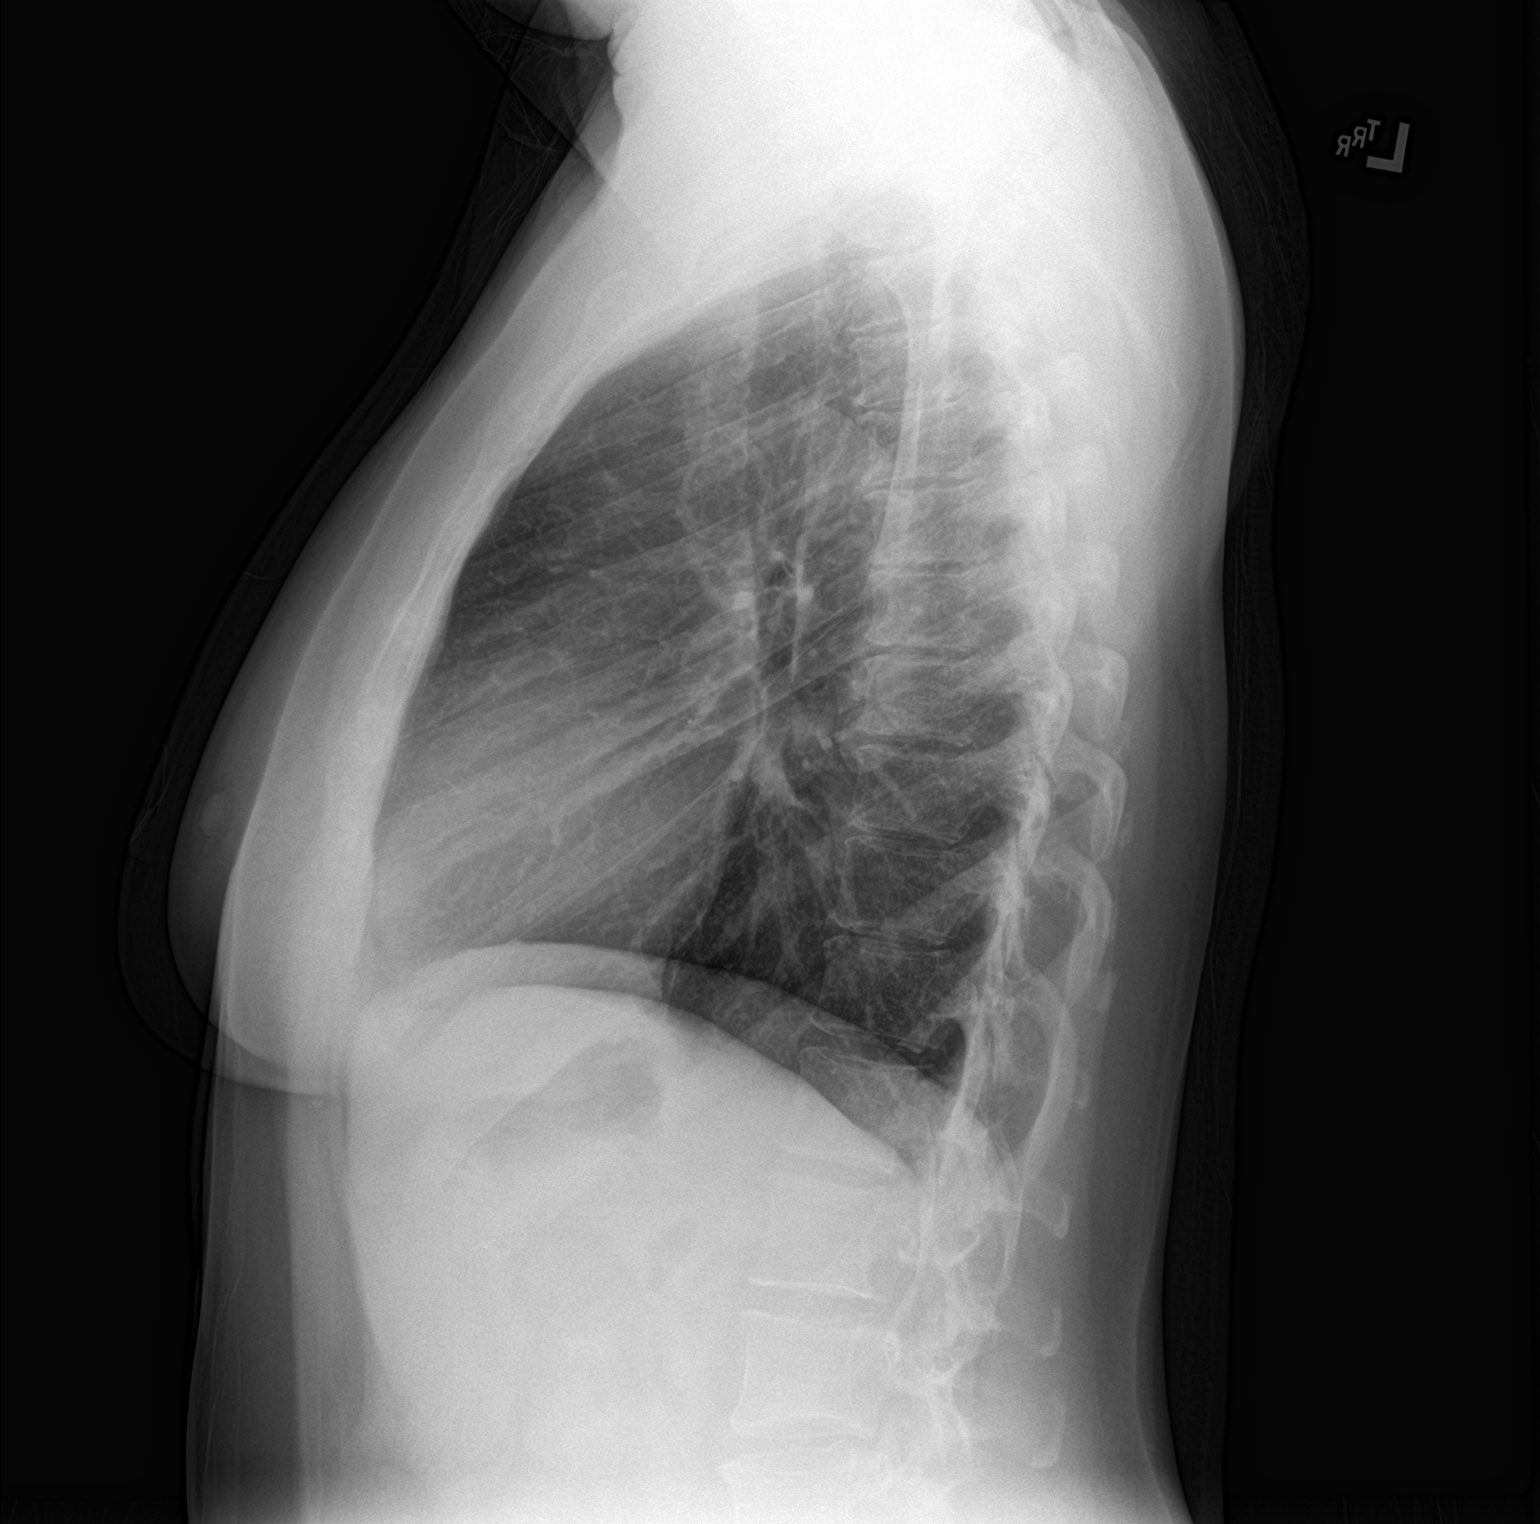

[2 of 2 positions shown; findings below may reference images not displayed]

FINDINGS: Normal heart size and mediastinal contours. No acute infiltrate or
edema. No effusion or pneumothorax. Spondylosis and mild lumbar
scoliosis. No acute osseous findings.
IMPRESSION: Negative chest.

## 2020-01-06 NOTE — Progress Notes (Signed)
   Covid-19 Vaccination Clinic  Name:  Samantha Stephens    MRN: [id number] DOB: 1961-05-19  01/06/2020  Samantha Stephens was observed post Covid-19 immunization for 15 minutes without incident. She was provided with Vaccine Information Sheet and instruction to access the V-Safe system.   Samantha Stephens was instructed to call 911 with any severe reactions post vaccine: Marland Kitchen Difficulty breathing  . Swelling of face and throat  . A fast heartbeat  . A bad rash all over body  . Dizziness and weakness   Immunizations Administered    Name Date Dose VIS Date Route   Pfizer COVID-19 Vaccine 01/06/2020  9:10 AM 0.3 mL 10/01/2019 Intramuscular   Manufacturer: [place]   Lot: [id number]   [place]: [id number]

## 2020-01-07 DIAGNOSIS — E559 Vitamin D deficiency, unspecified: Secondary | ICD-10-CM | POA: Insufficient documentation

## 2020-01-07 DIAGNOSIS — E538 Deficiency of other specified B group vitamins: Secondary | ICD-10-CM | POA: Insufficient documentation

## 2020-01-19 ENCOUNTER — Other Ambulatory Visit: Payer: Self-pay | Admitting: Family Medicine

## 2020-01-19 NOTE — Telephone Encounter (Signed)
Requested Prescriptions  Pending Prescriptions Disp Refills  . amLODipine (NORVASC) 5 MG tablet [Pharmacy Med Name: AMLODIPINE BESYLATE 5MG TABLETS] 90 tablet 3    Sig: TAKE 1 TABLET(5 MG) BY MOUTH DAILY     Cardiovascular:  Calcium Channel Blockers Failed - 01/19/2020  3:31 AM      Failed - Last BP in normal range    BP Readings from Last 1 Encounters:  12/02/19 (!) 159/90         Passed - Valid encounter within last 6 months    Recent Outpatient Visits          3 months ago Adult general medical exam   [facility] [place], Kristeen Miss, PA-C   9 months ago Weight loss   [place], NP   10 months ago Weight loss, unintentional   [place], NP   11 months ago Essential hypertension   [place], NP   11 months ago Hypertension goal BP (blood pressure) < 130/80   [place], NP

## 2020-02-01 ENCOUNTER — Ambulatory Visit: Payer: Managed Care, Other (non HMO) | Attending: Internal Medicine

## 2020-02-01 DIAGNOSIS — Z23 Encounter for immunization: Secondary | ICD-10-CM

## 2020-02-01 NOTE — Progress Notes (Signed)
   Covid-19 Vaccination Clinic  Name:  Samantha Stephens    MRN: [id number] DOB: 29-Aug-1961  02/01/2020  [name] was observed post Covid-19 immunization for 30 minutes based on pre-vaccination screening without incident. She was provided with Vaccine Information Sheet and instruction to access the V-Safe system.   [name] was instructed to call 911 with any severe reactions post vaccine: Marland Kitchen Difficulty breathing  . Swelling of face and throat  . A fast heartbeat  . A bad rash all over body  . Dizziness and weakness   Immunizations Administered    Name Date Dose VIS Date Route   Pfizer COVID-19 Vaccine 02/01/2020  9:00 AM 0.3 mL 10/01/2019 Intramuscular   Manufacturer: [place]   Lot: [id number]   [place]: [id number]

## 2020-03-18 IMAGING — CR CHEST - 2 VIEW
1 series · 2 of 2 positions shown · non-contrast
Comparison: 12/10/2018

CLINICAL DATA: Shortness of breath

EXAM:
CHEST - 2 VIEW

[Series 1: dg chest 2 view · 0.14mm/px · 2 of 2 slices shown]
[im 1/2]
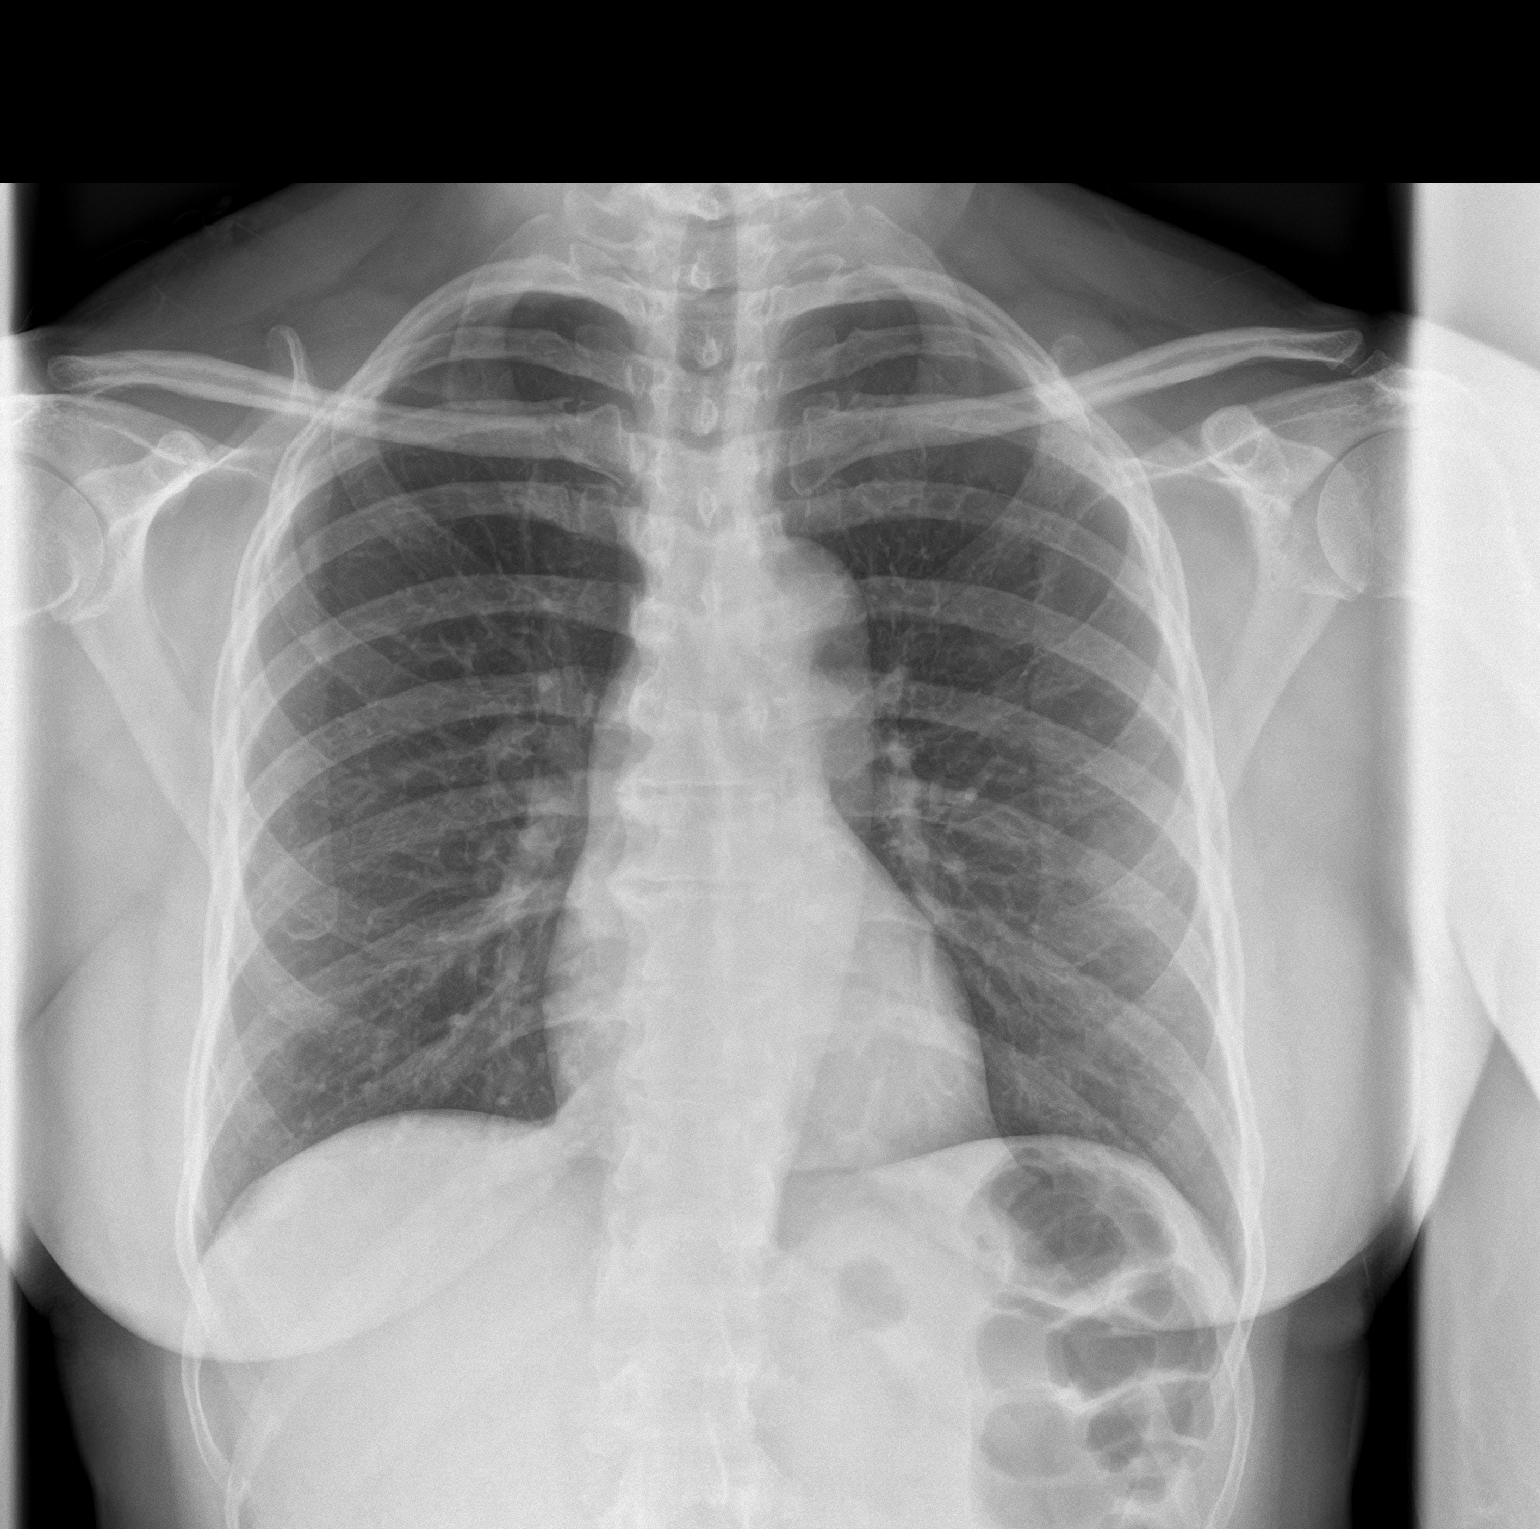
[im 2/2]
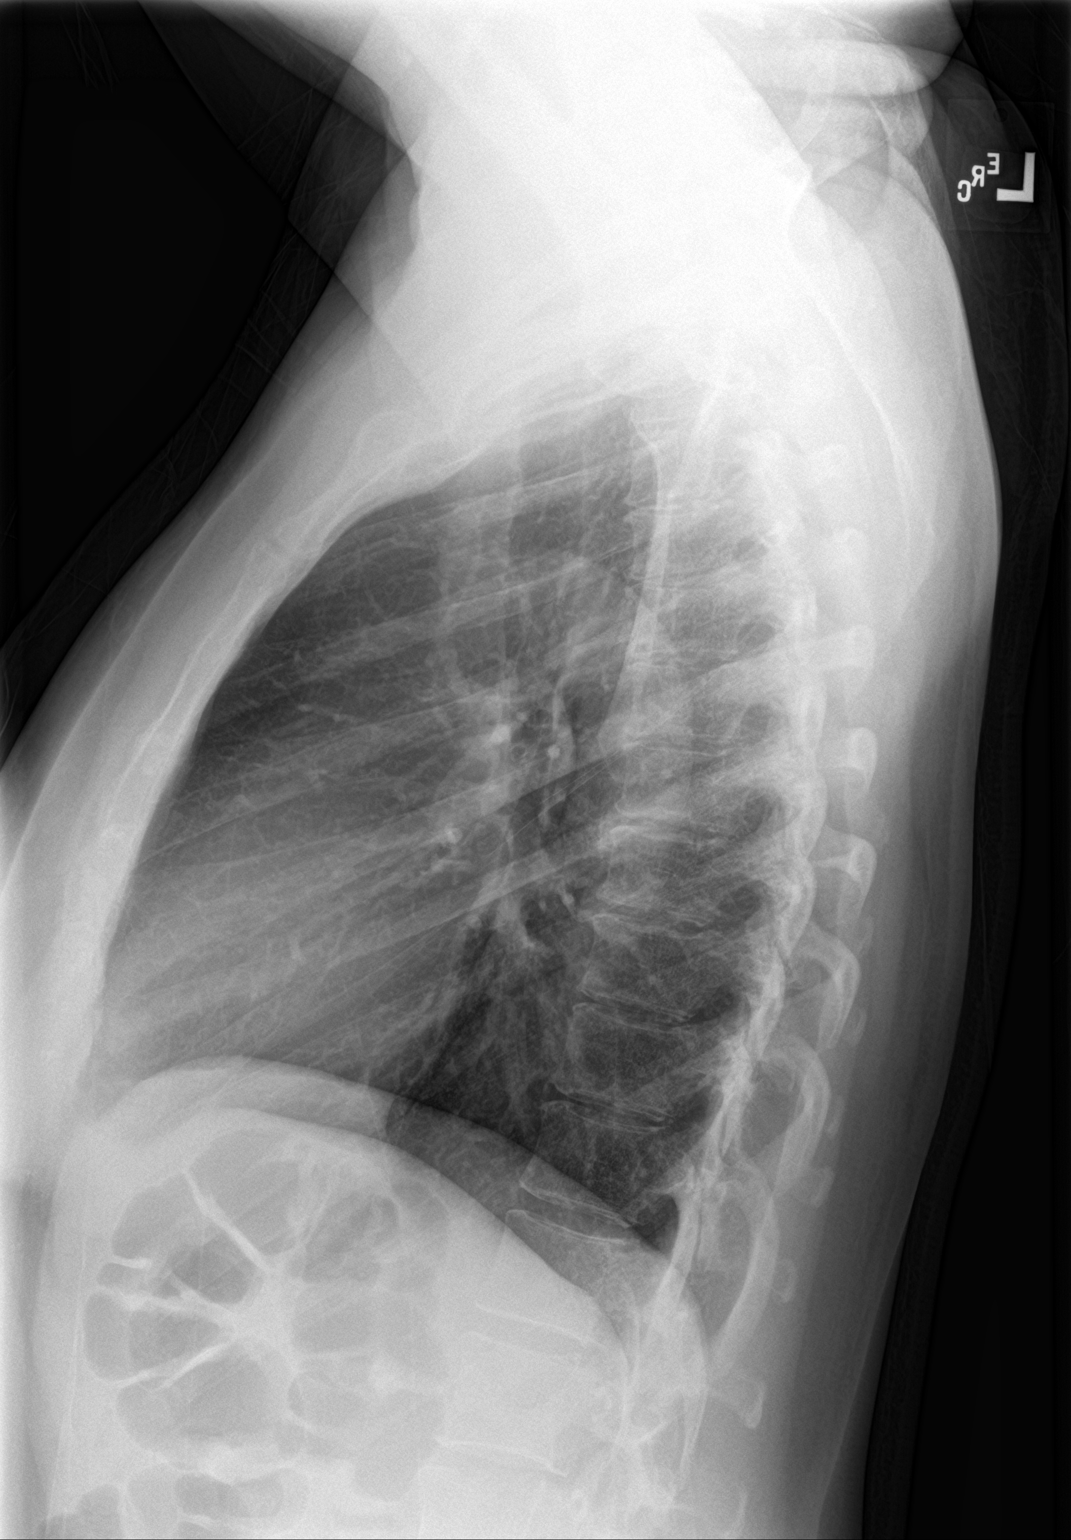

[2 of 2 positions shown; findings below may reference images not displayed]

FINDINGS: Heart and mediastinal contours are within normal limits. No focal
opacities or effusions. No acute bony abnormality.
IMPRESSION: No active cardiopulmonary disease.

## 2020-04-06 ENCOUNTER — Encounter: Payer: Managed Care, Other (non HMO) | Admitting: Family Medicine

## 2020-04-13 ENCOUNTER — Ambulatory Visit: Payer: Managed Care, Other (non HMO) | Admitting: Family Medicine

## 2020-04-13 ENCOUNTER — Other Ambulatory Visit: Payer: Self-pay

## 2020-04-13 ENCOUNTER — Encounter: Payer: Self-pay | Admitting: Family Medicine

## 2020-04-13 ENCOUNTER — Other Ambulatory Visit: Payer: Self-pay | Admitting: Family Medicine

## 2020-04-13 VITALS — BP 140/90 | HR 85 | Temp 97.1°F | Resp 16 | Ht 62.0 in | Wt 153.0 lb

## 2020-04-13 DIAGNOSIS — E1169 Type 2 diabetes mellitus with other specified complication: Secondary | ICD-10-CM

## 2020-04-13 DIAGNOSIS — F5104 Psychophysiologic insomnia: Secondary | ICD-10-CM

## 2020-04-13 DIAGNOSIS — E785 Hyperlipidemia, unspecified: Secondary | ICD-10-CM | POA: Diagnosis not present

## 2020-04-13 DIAGNOSIS — F419 Anxiety disorder, unspecified: Secondary | ICD-10-CM | POA: Diagnosis not present

## 2020-04-13 DIAGNOSIS — I10 Essential (primary) hypertension: Secondary | ICD-10-CM

## 2020-04-13 LAB — POCT GLYCOSYLATED HEMOGLOBIN (HGB A1C): Hemoglobin A1C: 6.8 % — AB (ref 4.0–5.6)

## 2020-04-13 MED ORDER — AMLODIPINE BESYLATE 2.5 MG PO TABS: 7.5000 mg | ORAL_TABLET | Freq: Every day | ORAL | 0 refills | Status: DC

## 2020-04-13 MED ORDER — EZETIMIBE 10 MG PO TABS: 10.0000 mg | ORAL_TABLET | Freq: Every day | ORAL | 3 refills | Status: DC

## 2020-04-13 MED ORDER — BUSPIRONE HCL 5 MG PO TABS: 5.0000 mg | ORAL_TABLET | Freq: Three times a day (TID) | ORAL | 0 refills | Status: DC

## 2020-04-13 NOTE — Progress Notes (Signed)
Name: Samantha Stephens   MRN: [id number]    DOB: May 10, 1961   Date:04/13/2020       Progress Note  Subjective  Chief Complaint  Chief Complaint  Patient presents with  . Anxiety  . Diabetes  . Hypertension    She has been checking her BP daily and noticed that it has been higher than nml. She also notices that she gets headaches when her bp is high.    HPI  DMII: she is on diet only, A1C still at goal today , not on ARB because of history of angioedema . She has dyslipidemia but unable to tolerate statins because it causes severe myalgia. . We will try adding zetia today. She used to Metformin but she stopped because she felt bad , A1C is good so we will not resume any  medications at this time.   HTN: she has been taking norvasc 5 mg daily, she used to take Metoprolol and Chlorthalidone and at one point had hypokalemia, she has been off diuretic and is still taking potassium. Advised to stop potassium  She is not sure why she stopped Metoprolol. BP at home has been going up to 164/100 , usually in the 136/90, she has noticed increase in headaches, and during a recent girls trip to [place] she felt flushed and palpitation and her sister was very concerned. Unable to tolerate Benicar. She does not want to take extra medications, we will go up on dose to 7.5 mg and monitor  Anxiety: she seems to getting more nervous lately, she has been given zoloft , hydroxyzine . Symptoms started after her husband died December 31, 2017. She felt like she was doing well but has been anxious again. She does not like medication, she was given some Ambien but ran out, discussed changing to trazodone but she states she had a reaction and had to go to [facility]. We will try buspar prn   Patient Active Problem List   Diagnosis Date Noted  . Vitamin D deficiency 01/07/2020  . Vitamin B12 deficiency 01/07/2020  . Osteoarthritis of hip 11/12/2019  . DDD (degenerative disc disease), cervical 04/19/2019  . Hyperlipidemia LDL goal <70  12/16/2018  . Grief reaction 06/01/2018  . Type 2 diabetes mellitus ([place]) 04/22/2018  . OSA on CPAP 09/15/2017  . Chronic hip pain, right 09/09/2016  . Numbness of right foot 09/09/2016  . Scoliosis 03/08/2016  . 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 Situational anxiety 10/27/2015  . Hypertension goal BP (blood pressure) < 130/80 08/18/2015  . Insomnia, controlled 08/18/2015    Past Surgical History:  Procedure Laterality Date  . ABDOMINAL HYSTERECTOMY    . CESAREAN SECTION     1  . COLONOSCOPY    . CYSTOSCOPY W/ RETROGRADES Bilateral 11/25/2016   Procedure: CYSTOSCOPY WITH RETROGRADE PYELOGRAM;  Surgeon: Hollice Espy, MD;  Location: [facility] ORS;  Service: Urology;  Laterality: Bilateral;  . EYE SURGERY     lasix eye surgery    Family History  Problem Relation Age of Onset  . Arthritis Mother   . COPD Mother   . Depression Mother   . Diabetes Mother   . Hyperlipidemia Mother   . Hypertension Mother   . Cataracts Mother   . Heart disease Mother   . Cancer Father        kidney and prostate cancer, colon cancer  . Hearing loss Father   . Kidney disease Father   . Cataracts Father   . Asthma Sister   .  Cancer Paternal Grandmother   . Mental illness Sister   . Breast cancer Other     Social History   Tobacco Use  . Smoking status: Former Research scientist (life sciences)  . Smokeless tobacco: Never Used  . Tobacco comment: but only for a short amount of time  Substance Use Topics  . Alcohol use: Yes    Alcohol/week: 0.0 - 1.0 standard drinks    Comment: occasional glass of wine     Current Outpatient Medications:  .  amLODipine (NORVASC) 2.5 MG tablet, Take 3 tablets (7.5 mg total) by mouth daily., Disp: 90 tablet, Rfl: 0 .  Cholecalciferol (VITAMIN D3) 2000 units TABS, Take 1 capsule by mouth daily., Disp: , Rfl:  .  glucose blood (ONETOUCH VERIO) test strip, Check fingerstick blood sugars once a day before breakfast; Dx E11.9; LON 99 months, Disp: 100 each, Rfl: 3 .   Lancets (ONETOUCH ULTRASOFT) lancets, Check fingerstick blood sugars once a day before breakfast; Dx E11.9, LON 99 months, Disp: 100 each, Rfl: 3 .  Multiple Vitamin (MULTIVITAMIN) tablet, Take 1 tablet by mouth daily., Disp: , Rfl:  .  aspirin EC 81 MG tablet, Take 1 tablet (81 mg total) by mouth daily., Disp: , Rfl:  .  busPIRone (BUSPAR) 5 MG tablet, Take 1 tablet (5 mg total) by mouth 3 (three) times daily., Disp: 90 tablet, Rfl: 0 .  ezetimibe (ZETIA) 10 MG tablet, Take 1 tablet (10 mg total) by mouth daily. For cholesterol, Disp: 90 tablet, Rfl: 3 .  Melatonin 5 MG CAPS, Take 5 mg by mouth daily. (Patient not taking: Reported on 04/13/2020), Disp: , Rfl:   Allergies  Allergen Reactions  . Trazodone And Nefazodone Other (See Comments)    Syncope; see in ER Feb 2018  . Benicar [Olmesartan] Cough  . Statins     I personally reviewed active problem list, medication list, allergies, family history, social history, health maintenance with the patient/caregiver today.   ROS  Constitutional: Negative for fever or weight change.  Respiratory: Negative for cough and shortness of breath.   Cardiovascular: Negative for chest pain or palpitations.  Gastrointestinal: Negative for abdominal pain, no bowel changes.  Musculoskeletal: Negative for gait problem or joint swelling.  Skin: Negative for rash.  Neurological: Negative for dizziness , positive for intermittent  headache.  No other specific complaints in a complete review of systems (except as listed in HPI above).  Objective  Vitals:   04/13/20 0932  BP: 140/90  Pulse: 85  Resp: 16  Temp: (!) 97.1 F (36.2 C)  TempSrc: Temporal  SpO2: 99%  Weight: 153 lb (69.4 kg)  Height: 5' 2"  (1.575 m)    Body mass index is 27.98 kg/m.  Physical Exam  Constitutional: Patient appears well-developed and well-nourished. Overweight. No distress.  HEENT: head atraumatic, normocephalic, pupils equal and reactive to light, neck  supple Cardiovascular: Normal rate, regular rhythm and normal heart sounds.  No murmur heard. No BLE edema. Pulmonary/Chest: Effort normal and breath sounds normal. No respiratory distress. Abdominal: Soft.  There is no tenderness. Psychiatric: Patient has a normal mood and affect. behavior is normal. Judgment and thought content normal.  Recent Results (from the past 2160 hour(s))  POCT HgB A1C     Status: Abnormal   Collection Time: 04/13/20  9:38 AM  Result Value Ref Range   Hemoglobin A1C 6.8 (A) 4.0 - 5.6 %   HbA1c POC (<> result, manual entry)     HbA1c, POC (prediabetic range)  HbA1c, POC (controlled diabetic range)      Diabetic Foot Exam: Diabetic Foot Exam - Simple   Simple Foot Form Diabetic Foot exam was performed with the following findings: Yes 04/13/2020 10:29 AM  Visual Inspection No deformities, no ulcerations, no other skin breakdown bilaterally: Yes Sensation Testing Intact to touch and monofilament testing bilaterally: Yes Pulse Check Posterior Tibialis and Dorsalis pulse intact bilaterally: Yes Comments     PHQ2/9: Depression screen [facility] 2/9 04/13/2020 10/04/2019 04/19/2019 03/19/2019 02/17/2019  Decreased Interest 0 0 0 0 0  Down, Depressed, Hopeless 0 0 0 0 0  PHQ - 2 Score 0 0 0 0 0  Altered sleeping 0 0 0 0 0  Tired, decreased energy 0 0 0 0 0  Change in appetite 0 0 0 0 0  Feeling bad or failure about yourself  0 0 0 0 0  Trouble concentrating 0 0 0 0 0  Moving slowly or fidgety/restless 0 0 0 0 0  Suicidal thoughts 0 0 0 0 0  PHQ-9 Score 0 0 0 0 0  Difficult doing work/chores - Not difficult at all Not difficult at all Not difficult at all Not difficult at all  Some recent data might be hidden    phq 9 is negative   Fall Risk: Fall Risk  04/13/2020 10/04/2019 04/19/2019 03/19/2019 02/23/2019  Falls in the past year? 0 0 0 0 0  Number falls in past yr: 0 0 0 - 0  Injury with Fall? 0 0 0 - 0     Functional Status Survey: Is the patient deaf  or have difficulty hearing?: Yes Does the patient have difficulty seeing, even when wearing glasses/contacts?: No Does the patient have difficulty concentrating, remembering, or making decisions?: No Does the patient have difficulty walking or climbing stairs?: No Does the patient have difficulty dressing or bathing?: No Does the patient have difficulty doing errands alone such as visiting a doctor's office or shopping?: No   Assessment & Plan  1. Dyslipidemia associated with type 2 diabetes mellitus (HCC)  - POCT HgB A1C - ezetimibe (ZETIA) 10 MG tablet; Take 1 tablet (10 mg total) by mouth daily. For cholesterol  Dispense: 90 tablet; Refill: 3  2. Anxiety  - busPIRone (BUSPAR) 5 MG tablet; Take 1 tablet (5 mg total) by mouth 3 (three) times daily.  Dispense: 90 tablet; Refill: 0  3. Hyperlipidemia LDL goal <70  - ezetimibe (ZETIA) 10 MG tablet; Take 1 tablet (10 mg total) by mouth daily. For cholesterol  Dispense: 90 tablet; Refill: 3  4. Essential hypertension  - amLODipine (NORVASC) 2.5 MG tablet; Take 3 tablets (7.5 mg total) by mouth daily.  Dispense: 90 tablet; Refill: 0  5. Psychophysiological insomnia  - busPIRone (BUSPAR) 5 MG tablet; Take 1 tablet (5 mg total) by mouth 3 (three) times daily.  Dispense: 90 tablet; Refill: 0

## 2020-05-03 ENCOUNTER — Ambulatory Visit
Admission: RE | Admit: 2020-05-03 | Discharge: 2020-05-03 | Disposition: A | Discharge status: Home / Self Care | Payer: Managed Care, Other (non HMO) | Source: Ambulatory Visit | Attending: Family Medicine | Admitting: Family Medicine

## 2020-05-03 DIAGNOSIS — Z1231 Encounter for screening mammogram for malignant neoplasm of breast: Secondary | ICD-10-CM | POA: Insufficient documentation

## 2020-05-10 ENCOUNTER — Other Ambulatory Visit: Payer: Self-pay | Admitting: Family Medicine

## 2020-05-10 DIAGNOSIS — I10 Essential (primary) hypertension: Secondary | ICD-10-CM

## 2020-05-10 NOTE — Telephone Encounter (Signed)
Requested Prescriptions  Pending Prescriptions Disp Refills  . amLODipine (NORVASC) 2.5 MG tablet [Pharmacy Med Name: AMLODIPINE BESYLATE 2.5MG TABLETS] 90 tablet 1    Sig: TAKE 3 TABLETS(7.5 MG) BY MOUTH DAILY     Cardiovascular:  Calcium Channel Blockers Failed - 05/10/2020  3:30 AM      Failed - Last BP in normal range    BP Readings from Last 1 Encounters:  04/13/20 140/90         Passed - Valid encounter within last 6 months    Recent Outpatient Visits          3 weeks ago [facility] Steele Sizer, MD   7 months ago Adult general medical exam   [facility] Delsa Grana, PA-C   1 year ago Weight loss   [place], NP   1 year ago Weight loss, unintentional   [place], [place], NP   1 year ago Essential hypertension   [place], [place], NP      Future Appointments            In 2 months Steele Sizer, MD [place]           new dose for patient as of 04/13/2020.

## 2020-05-17 ENCOUNTER — Encounter: Payer: Managed Care, Other (non HMO) | Admitting: Family Medicine

## 2020-05-19 ENCOUNTER — Encounter: Payer: Self-pay | Admitting: Family Medicine

## 2020-05-19 ENCOUNTER — Ambulatory Visit (INDEPENDENT_AMBULATORY_CARE_PROVIDER_SITE_OTHER): Payer: Managed Care, Other (non HMO) | Admitting: Family Medicine

## 2020-05-19 ENCOUNTER — Other Ambulatory Visit: Payer: Self-pay

## 2020-05-19 VITALS — BP 130/84 | HR 95 | Temp 97.1°F | Resp 16 | Ht 63.0 in | Wt 152.5 lb

## 2020-05-19 DIAGNOSIS — Z0000 Encounter for general adult medical examination without abnormal findings: Secondary | ICD-10-CM | POA: Diagnosis not present

## 2020-05-19 NOTE — Patient Instructions (Signed)
Preventive Care 40-60 Years Old, Female °Preventive care refers to visits with your health care provider and lifestyle choices that can promote health and wellness. This includes: °· A yearly physical exam. This may also be called an annual well check. °· Regular dental visits and eye exams. °· Immunizations. °· Screening for certain conditions. °· Healthy lifestyle choices, such as eating a healthy diet, getting regular exercise, not using drugs or products that contain nicotine and tobacco, and limiting alcohol use. °What can I expect for my preventive care visit? °Physical exam °Your health care provider will check your: °· Height and weight. This may be used to calculate body mass index (BMI), which tells if you are at a healthy weight. °· Heart rate and blood pressure. °· Skin for abnormal spots. °Counseling °Your health care provider may ask you questions about your: °· Alcohol, tobacco, and drug use. °· Emotional well-being. °· Home and relationship well-being. °· Sexual activity. °· Eating habits. °· Work and work environment. °· Method of birth control. °· Menstrual cycle. °· Pregnancy history. °What immunizations do I need? ° °Influenza (flu) vaccine °· This is recommended every year. °Tetanus, diphtheria, and pertussis (Tdap) vaccine °· You may need a Td booster every 10 years. °Varicella (chickenpox) vaccine °· You may need this if you have not been vaccinated. °Zoster (shingles) vaccine °· You may need this after age 60. °Measles, mumps, and rubella (MMR) vaccine °· You may need at least one dose of MMR if you were born in 1957 or later. You may also need a second dose. °Pneumococcal conjugate (PCV13) vaccine °· You may need this if you have certain conditions and were not previously vaccinated. °Pneumococcal polysaccharide (PPSV23) vaccine °· You may need one or two doses if you smoke cigarettes or if you have certain conditions. °Meningococcal conjugate (MenACWY) vaccine °· You may need this if you  have certain conditions. °Hepatitis A vaccine °· You may need this if you have certain conditions or if you travel or work in places where you may be exposed to hepatitis A. °Hepatitis B vaccine °· You may need this if you have certain conditions or if you travel or work in places where you may be exposed to hepatitis B. °Haemophilus influenzae type b (Hib) vaccine °· You may need this if you have certain conditions. °Human papillomavirus (HPV) vaccine °· If recommended by your health care provider, you may need three doses over 6 months. °You may receive vaccines as individual doses or as more than one vaccine together in one shot (combination vaccines). Talk with your health care provider about the risks and benefits of combination vaccines. °What tests do I need? °Blood tests °· Lipid and cholesterol levels. These may be checked every 5 years, or more frequently if you are over 60 years old. °· Hepatitis C test. °· Hepatitis B test. °Screening °· Lung cancer screening. You may have this screening every year starting at age 60 if you have a 30-pack-year history of smoking and currently smoke or have quit within the past 15 years. °· Colorectal cancer screening. All adults should have this screening starting at age 60 and continuing until age 75. Your health care provider may recommend screening at age 45 if you are at increased risk. You will have tests every 1-10 years, depending on your results and the type of screening test. °· Diabetes screening. This is done by checking your blood sugar (glucose) after you have not eaten for a while (fasting). You may have this   done every 1-3 years.  Mammogram. This may be done every 1-2 years. Talk with your health care provider about when you should start having regular mammograms. This may depend on whether you have a family history of breast cancer.  BRCA-related cancer screening. This may be done if you have a family history of breast, ovarian, tubal, or peritoneal  cancers.  Pelvic exam and Pap test. This may be done every 3 years starting at age 60. Starting at age 60, this may be done every 5 years if you have a Pap test in combination with an HPV test. Other tests  Sexually transmitted disease (STD) testing.  Bone density scan. This is done to screen for osteoporosis. You may have this scan if you are at high risk for osteoporosis. Follow these instructions at home: Eating and drinking  Eat a diet that includes fresh fruits and vegetables, whole grains, lean protein, and low-fat dairy.  Take vitamin and mineral supplements as recommended by your health care provider.  Do not drink alcohol if: ? Your health care provider tells you not to drink. ? You are pregnant, may be pregnant, or are planning to become pregnant.  If you drink alcohol: ? Limit how much you have to 0-1 drink a day. ? Be aware of how much alcohol is in your drink. In the [place] one drink equals one 12 oz bottle of beer (355 mL), one 5 oz glass of wine (148 mL), or one 1 oz glass of hard liquor (44 mL). Lifestyle  Take daily care of your teeth and gums.  Stay active. Exercise for at least 30 minutes on 5 or more days each week.  Do not use any products that contain nicotine or tobacco, such as cigarettes, e-cigarettes, and chewing tobacco. If you need help quitting, ask your health care provider.  If you are sexually active, practice safe sex. Use a condom or other form of birth control (contraception) in order to prevent pregnancy and STIs (sexually transmitted infections).  If told by your health care provider, take low-dose aspirin daily starting at age 60. What's next?  Visit your health care provider once a year for a well check visit.  Ask your health care provider how often you should have your eyes and teeth checked.  Stay up to date on all vaccines. This information is not intended to replace advice given to you by your health care provider. Make sure you  discuss any questions you have with your health care provider. Document Revised: 06/18/2018 Document Reviewed: 06/18/2018 Elsevier Patient Education  2020 [employer].

## 2020-05-19 NOTE — Addendum Note (Signed)
Addended by: Steele Sizer F on: 05/19/2020 02:59 PM   Modules accepted: Orders

## 2020-05-19 NOTE — Progress Notes (Signed)
Name: Samantha Stephens   MRN: [id number]    DOB: 02/07/1961   Date:05/19/2020       Progress Note  Subjective  Chief Complaint  Chief Complaint  Patient presents with  . Annual Exam    HPI  Patient presents for annual CPE.  Diet: she eats a balanced diet  Exercise: continue regular physical activity   USPSTF grade A and B recommendations    [facility] Visit from 05/19/2020 in [facility]  AUDIT-C Score 1     Depression: Phq 9 is  positive Depression screen [facility] 2/9 05/19/2020 04/13/2020 10/04/2019 04/19/2019 03/19/2019  Decreased Interest 3 0 0 0 0  Down, Depressed, Hopeless 1 0 0 0 0  PHQ - 2 Score 4 0 0 0 0  Altered sleeping 2 0 0 0 0  Tired, decreased energy 1 0 0 0 0  Change in appetite 0 0 0 0 0  Feeling bad or failure about yourself  0 0 0 0 0  Trouble concentrating 1 0 0 0 0  Moving slowly or fidgety/restless 0 0 0 0 0  Suicidal thoughts 0 0 0 0 0  PHQ-9 Score 8 0 0 0 0  Difficult doing work/chores - - Not difficult at all Not difficult at all Not difficult at all  Some recent data might be hidden   Hypertension: BP Readings from Last 3 Encounters:  05/19/20 (!) 130/84  04/13/20 140/90  12/02/19 (!) 159/90   Obesity: Wt Readings from Last 3 Encounters:  05/19/20 152 lb 8 oz (69.2 kg)  04/13/20 153 lb (69.4 kg)  12/02/19 155 lb (70.3 kg)   BMI Readings from Last 3 Encounters:  05/19/20 27.01 kg/m  04/13/20 27.98 kg/m  12/02/19 26.61 kg/m     Hep C Screening: negative  STD testing and prevention (HIV/chl/gon/syphilis): she would like to have it repeated  Intimate partner violence: negative screen  Sexual History (Partners/Practices/Protection from [employer] hx STI/Pregnancy Plans): not currently sexually active  Pain during Intercourse: not sexually active  Menstrual History/LMP/Abnormal Bleeding: discussed post menopausal bleeding  Incontinence Symptoms: no problems   Breast cancer:  - Last Mammogram: 2021  - BRCA gene screening:  N/A  Osteoporosis: Discussed high calcium and vitamin D supplementation, weight bearing exercises  Cervical cancer screening: N/A  Skin cancer: Discussed monitoring for atypical lesions  Colorectal cancer: repeat in 2023 - family history of colon cancer also has a history of chron's  Lung cancer:  Low Dose CT Chest recommended if Age 30-80 years, 30 pack-year currently smoking OR have quit w/in 15years. Patient does not qualify.   ECG: 02/2019  Advanced Care Planning: A voluntary discussion about advance care planning including the explanation and discussion of advance directives.  Discussed health care proxy and Living will, and the patient was able to identify a health care proxy as daughter .  Patient does have a living will at present time.   Lipids: Lab Results  Component Value Date   CHOL 214 (H) 10/13/2019   CHOL 164 12/31/2018   CHOL 235 (H) 09/25/2018   Lab Results  Component Value Date   HDL 66 10/13/2019   HDL 55 12/31/2018   HDL 62 09/25/2018   Lab Results  Component Value Date   LDLCALC 124 (H) 10/13/2019   LDLCALC 82 12/31/2018   LDLCALC 135 (H) 09/25/2018   Lab Results  Component Value Date   TRIG 135 10/13/2019   TRIG 134 12/31/2018   TRIG 190 (H) 09/25/2018  Lab Results  Component Value Date   CHOLHDL 3.2 10/13/2019   CHOLHDL 3.0 12/31/2018   CHOLHDL 3.8 09/25/2018   No results found for: LDLDIRECT  Glucose: Glucose  Date Value Ref Range Status  10/13/2019 122 (H) 65 - 99 mg/dL Final  12/31/2018 102 (H) 65 - 99 mg/dL Final  10/20/2012 118 (H) 65 - 99 mg/dL Final   Glucose, Bld  Date Value Ref Range Status  03/19/2019 77 65 - 99 mg/dL Final    Comment:    .            Fasting reference interval .   02/20/2019 136 (H) 70 - 99 mg/dL Final  12/10/2018 119 (H) 70 - 99 mg/dL Final   Glucose-Capillary  Date Value Ref Range Status  11/21/2016 77 65 - 99 mg/dL Final    Patient Active Problem List   Diagnosis Date Noted  . Vitamin D  deficiency 01/07/2020  . Vitamin B12 deficiency 01/07/2020  . Osteoarthritis of hip 11/12/2019  . DDD (degenerative disc disease), cervical 04/19/2019  . Hyperlipidemia LDL goal <70 12/16/2018  . Grief reaction 06/01/2018  . Type 2 diabetes mellitus ([place]) 04/22/2018  . OSA on CPAP 09/15/2017  . Chronic hip pain, right 09/09/2016  . Numbness of right foot 09/09/2016  . Scoliosis 03/08/2016  . 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 Situational anxiety 10/27/2015  . Hypertension goal BP (blood pressure) < 130/80 08/18/2015  . Insomnia, controlled 08/18/2015    Past Surgical History:  Procedure Laterality Date  . ABDOMINAL HYSTERECTOMY    . CESAREAN SECTION     1  . COLONOSCOPY    . CYSTOSCOPY W/ RETROGRADES Bilateral 11/25/2016   Procedure: CYSTOSCOPY WITH RETROGRADE PYELOGRAM;  Surgeon: Hollice Espy, MD;  Location: [facility] ORS;  Service: Urology;  Laterality: Bilateral;  . EYE SURGERY     lasix eye surgery    Family History  Problem Relation Age of Onset  . Arthritis Mother   . COPD Mother   . Depression Mother   . Diabetes Mother   . Hyperlipidemia Mother   . Hypertension Mother   . Cataracts Mother   . Heart disease Mother   . Cancer Father        kidney and prostate cancer, colon cancer  . Hearing loss Father   . Kidney disease Father   . Cataracts Father   . Asthma Sister   . Cancer Paternal Grandmother   . Mental illness Sister   . Breast cancer Other     Social History   Socioeconomic History  . Marital status: Widowed    Spouse name: Not on file  . Number of children: 1  . Years of education: Not on file  . Highest education level: Bachelor's degree (e.g., BA, AB, BS)  Occupational History  . Not on file  Tobacco Use  . Smoking status: Former Research scientist (life sciences)  . Smokeless tobacco: Never Used  . Tobacco comment: but only for a short amount of time  Vaping Use  . Vaping Use: Never used  Substance and Sexual Activity  . Alcohol use: Yes     Alcohol/week: 0.0 - 1.0 standard drinks    Comment: occasional glass of wine  . Drug use: No  . Sexual activity: Yes    Partners: Male  Other Topics Concern  . Not on file  Social History Narrative   Father is back on Chemo - stated that the situation is not looking good   Social Determinants of Health  Financial Resource Strain: Low Risk   . Difficulty of Paying Living Expenses: Not hard at all  Food Insecurity: No Food Insecurity  . Worried About Charity fundraiser in the Last Year: Never true  . Ran Out of Food in the Last Year: Never true  Transportation Needs: No Transportation Needs  . Lack of Transportation (Medical): No  . Lack of Transportation (Non-Medical): No  Physical Activity: Sufficiently Active  . Days of Exercise per Week: 6 days  . Minutes of Exercise per Session: 60 min  Stress: Stress Concern Present  . Feeling of Stress : To some extent  Social Connections: Moderately Isolated  . Frequency of Communication with Friends and Family: More than three times a week  . Frequency of Social Gatherings with Friends and Family: Once a week  . Attends Religious Services: Never  . Active Member of Clubs or Organizations: Yes  . Attends Archivist Meetings: More than 4 times per year  . Marital Status: Widowed  Intimate Partner Violence: Not At Risk  . Fear of Current or Ex-Partner: No  . Emotionally Abused: No  . Physically Abused: No  . Sexually Abused: No     Current Outpatient Medications:  .  amLODipine (NORVASC) 2.5 MG tablet, TAKE 3 TABLETS(7.5 MG) BY MOUTH DAILY, Disp: 90 tablet, Rfl: 1 .  busPIRone (BUSPAR) 5 MG tablet, Take 1 tablet (5 mg total) by mouth 3 (three) times daily., Disp: 90 tablet, Rfl: 0 .  Cholecalciferol (VITAMIN D3) 2000 units TABS, Take 1 capsule by mouth daily., Disp: , Rfl:  .  ezetimibe (ZETIA) 10 MG tablet, Take 1 tablet (10 mg total) by mouth daily. For cholesterol, Disp: 90 tablet, Rfl: 3 .  glucose blood (ONETOUCH  VERIO) test strip, Check fingerstick blood sugars once a day before breakfast; Dx E11.9; LON 99 months, Disp: 100 each, Rfl: 3 .  Lancets (ONETOUCH ULTRASOFT) lancets, Check fingerstick blood sugars once a day before breakfast; Dx E11.9, LON 99 months, Disp: 100 each, Rfl: 3 .  Melatonin 5 MG CAPS, Take 5 mg by mouth daily. , Disp: , Rfl:  .  Multiple Vitamin (MULTIVITAMIN) tablet, Take 1 tablet by mouth daily., Disp: , Rfl:  .  aspirin EC 81 MG tablet, Take 1 tablet (81 mg total) by mouth daily. (Patient not taking: Reported on 05/19/2020), Disp: , Rfl:   Allergies  Allergen Reactions  . Trazodone And Nefazodone Other (See Comments)    Syncope; see in ER Feb 2018  . Benicar [Olmesartan] Cough  . Statins      ROS  Constitutional: Negative for fever or weight change.  Respiratory: Negative for cough and shortness of breath.   Cardiovascular: Negative for chest pain or palpitations.  Gastrointestinal: Negative for abdominal pain, no bowel changes.  Musculoskeletal: Negative for gait problem or joint swelling.  Skin: Negative for rash.  Neurological: Negative for dizziness or headache.  No other specific complaints in a complete review of systems (except as listed in HPI above).   Objective  Vitals:   05/19/20 1407  BP: (!) 130/84  Pulse: 95  Resp: 16  Temp: (!) 97.1 F (36.2 C)  TempSrc: Temporal  SpO2: 98%  Weight: 152 lb 8 oz (69.2 kg)  Height: 5' 3"  (1.6 m)    Body mass index is 27.01 kg/m.  Physical Exam  Constitutional: Patient appears well-developed and well-nourished. No distress.  HENT: Head: Normocephalic and atraumatic. Ears: B TMs ok, no erythema or effusion; Nose: Nose normal. Mouth/Throat:not  done  Eyes: Conjunctivae and EOM are normal. Pupils are equal, round, and reactive to light. No scleral icterus.  Neck: Normal range of motion. Neck supple. No JVD present. No thyromegaly present.  Cardiovascular: Normal rate, regular rhythm and normal heart sounds.   No murmur heard. No BLE edema. Pulmonary/Chest: Effort normal and breath sounds normal. No respiratory distress. Abdominal: Soft. Bowel sounds are normal, no distension. There is no tenderness. no masses Breast: no lumps or masses, no nipple discharge or rashes FEMALE GENITALIA:  Not done RECTAL: not done  Musculoskeletal: Normal range of motion, no joint effusions. No gross deformities Neurological: he is alert and oriented to person, place, and time. No cranial nerve deficit. Coordination, balance, strength, speech and gait are normal.  Skin: Skin is warm and dry. No rash noted. No erythema.  Psychiatric: Patient has a normal mood and affect. behavior is normal. Judgment and thought content normal.  Recent Results (from the past 2160 hour(s))  POCT HgB A1C     Status: Abnormal   Collection Time: 04/13/20  9:38 AM  Result Value Ref Range   Hemoglobin A1C 6.8 (A) 4.0 - 5.6 %   HbA1c POC (<> result, manual entry)     HbA1c, POC (prediabetic range)     HbA1c, POC (controlled diabetic range)       Fall Risk: Fall Risk  05/19/2020 04/13/2020 10/04/2019 04/19/2019 03/19/2019  Falls in the past year? 0 0 0 0 0  Number falls in past yr: 0 0 0 0 -  Injury with Fall? 0 0 0 0 -     Functional Status Survey: Is the patient deaf or have difficulty hearing?: No Does the patient have difficulty seeing, even when wearing glasses/contacts?: No Does the patient have difficulty concentrating, remembering, or making decisions?: No Does the patient have difficulty walking or climbing stairs?: No Does the patient have difficulty dressing or bathing?: No Does the patient have difficulty doing errands alone such as visiting a doctor's office or shopping?: No   Assessment & Plan  1. Well adult exam  - Lipid panel - CBC with Differential/Platelet - Comprehensive metabolic panel - U88 and Folate Panel - VITAMIN D 25 Hydroxy (Vit-D Deficiency, Fractures) - TSH - RPR - HIV Antibody (routine  testing w rflx) - Hepatitis panel, acute  -USPSTF grade A and B recommendations reviewed with patient; age-appropriate recommendations, preventive care, screening tests, etc discussed and encouraged; healthy living encouraged; see AVS for patient education given to patient -Discussed importance of 150 minutes of physical activity weekly, eat two servings of fish weekly, eat one serving of tree nuts ( cashews, pistachios, pecans, almonds.Marland Kitchen) every other day, eat 6 servings of fruit/vegetables daily and drink plenty of water and avoid sweet beverages.

## 2020-05-26 LAB — CBC WITH DIFFERENTIAL/PLATELET
Basophils Absolute: 0.1 10*3/uL (ref 0.0–0.2)
Basos: 1 %
EOS (ABSOLUTE): 0.5 10*3/uL — ABNORMAL HIGH (ref 0.0–0.4)
Eos: 7 %
Hematocrit: 40.2 % (ref 34.0–46.6)
Hemoglobin: 13.2 g/dL (ref 11.1–15.9)
Immature Grans (Abs): 0 10*3/uL (ref 0.0–0.1)
Immature Granulocytes: 0 %
Lymphocytes Absolute: 3.2 10*3/uL — ABNORMAL HIGH (ref 0.7–3.1)
Lymphs: 43 %
MCH: 25.2 pg — ABNORMAL LOW (ref 26.6–33.0)
MCHC: 32.8 g/dL (ref 31.5–35.7)
MCV: 77 fL — ABNORMAL LOW (ref 79–97)
Monocytes Absolute: 0.4 10*3/uL (ref 0.1–0.9)
Monocytes: 6 %
Neutrophils Absolute: 3.2 10*3/uL (ref 1.4–7.0)
Neutrophils: 43 %
Platelets: 288 10*3/uL (ref 150–450)
RBC: 5.23 x10E6/uL (ref 3.77–5.28)
RDW: 15 % (ref 11.7–15.4)
WBC: 7.5 10*3/uL (ref 3.4–10.8)

## 2020-05-26 LAB — COMPREHENSIVE METABOLIC PANEL
ALT: 21 IU/L (ref 0–32)
AST: 24 IU/L (ref 0–40)
Albumin/Globulin Ratio: 2.2 (ref 1.2–2.2)
Albumin: 4.9 g/dL (ref 3.8–4.9)
Alkaline Phosphatase: 106 IU/L (ref 48–121)
BUN/Creatinine Ratio: 17 (ref 9–23)
BUN: 18 mg/dL (ref 6–24)
Bilirubin Total: 0.2 mg/dL (ref 0.0–1.2)
CO2: 25 mmol/L (ref 20–29)
Calcium: 10 mg/dL (ref 8.7–10.2)
Chloride: 100 mmol/L (ref 96–106)
Creatinine, Ser: 1.03 mg/dL — ABNORMAL HIGH (ref 0.57–1.00)
GFR calc Af Amer: 69 mL/min/{1.73_m2} (ref >59)
GFR calc non Af Amer: 60 mL/min/{1.73_m2} (ref >59)
Globulin, Total: 2.2 g/dL (ref 1.5–4.5)
Glucose: 108 mg/dL — ABNORMAL HIGH (ref 65–99)
Potassium: 4.1 mmol/L (ref 3.5–5.2)
Sodium: 141 mmol/L (ref 134–144)
Total Protein: 7.1 g/dL (ref 6.0–8.5)

## 2020-05-26 LAB — TSH: TSH: 1.69 u[IU]/mL (ref 0.450–4.500)

## 2020-05-26 LAB — B12 AND FOLATE PANEL
Folate: >20 ng/mL (ref >3.0)
Vitamin B-12: 1287 pg/mL — ABNORMAL HIGH (ref 232–1245)

## 2020-05-26 LAB — HIV ANTIBODY (ROUTINE TESTING W REFLEX): HIV Screen 4th Generation wRfx: NONREACTIVE

## 2020-05-26 LAB — LIPID PANEL
Chol/HDL Ratio: 4.6 ratio — ABNORMAL HIGH (ref 0.0–4.4)
Cholesterol, Total: 238 mg/dL — ABNORMAL HIGH (ref 100–199)
HDL: 52 mg/dL (ref >39)
LDL Chol Calc (NIH): 118 mg/dL — ABNORMAL HIGH (ref 0–99)
Triglycerides: 393 mg/dL — ABNORMAL HIGH (ref 0–149)
VLDL Cholesterol Cal: 68 mg/dL — ABNORMAL HIGH (ref 5–40)

## 2020-05-26 LAB — VITAMIN D 25 HYDROXY (VIT D DEFICIENCY, FRACTURES): Vit D, 25-Hydroxy: 35.4 ng/mL (ref 30.0–100.0)

## 2020-05-26 LAB — HEPATITIS PANEL, ACUTE
Hep A IgM: NEGATIVE
Hep B C IgM: NEGATIVE
Hep C Virus Ab: 2.1 s/co ratio — ABNORMAL HIGH (ref 0.0–0.9)
Hepatitis B Surface Ag: NEGATIVE

## 2020-05-26 LAB — RPR: RPR Ser Ql: NONREACTIVE

## 2020-05-28 ENCOUNTER — Other Ambulatory Visit: Payer: Self-pay | Admitting: Family Medicine

## 2020-05-28 DIAGNOSIS — R768 Other specified abnormal immunological findings in serum: Secondary | ICD-10-CM

## 2020-05-30 DIAGNOSIS — Z8719 Personal history of other diseases of the digestive system: Secondary | ICD-10-CM | POA: Insufficient documentation

## 2020-06-23 NOTE — Progress Notes (Signed)
06/27/2020 10:46 AM   Samantha Stephens 1961-07-10 [id number]  Referring provider: Marisa Hua, PA-C [place] [address] [place],  [place] [address] Chief Complaint  Patient presents with  . Flank Pain    follow up    HPI: Samantha Stephens is a 59 y.o. female who presents today for re-evaluation of flank pain.   The patient was last seen in 10/2016 for gross hematuria and left flank pain. (see note for details)  She underwent a cystoscopy with retrograde pyelogram on 11/25/2016. Normal bladder and unremarkable bilateral retrograde pyelograms. CT scan was unremarkable.   Reports right flank pain and lower abdominal pain which started ~1 month ago.  Initially, she thought that it may be related to her appendix or possibly her history of Crohn's disease.  She was seen and evaluated by gastroenterology who checked a urine at that point in time which was suspicious for infection.  She was treated with antibiotics and her discomfort improved.  She did not have any traditional UTI type symptoms.  UA from 05/30/20 noted nitrite positive, 10-50 WBC, no RBCs, and many bacteria. Urine culture grew less than 10,000 colonies/mL.  Denies hematuria. Denies kidney stones.  She has a very remote smoking history.  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    Patient has a personal history of Crohn's disease.   No personal history of kidney cancer. Father has personal history of colon, kidney cancer, and prostate cancer.   PMH: Past Medical History:  Diagnosis Date  . Arthritis   . Chronic mid back pain 08/18/2015  . 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 Family hx of colon cancer requiring screening colonoscopy 03/08/2016  . Headache   . Hyperlipidemia   . Hypertension   . Hypokalemia   . Palpitations   . Scoliosis 03/08/2016  . Sleep apnea   . Stress headaches     Surgical History: Past Surgical History:  Procedure Laterality Date  .  ABDOMINAL HYSTERECTOMY    . CESAREAN SECTION     1  . COLONOSCOPY    . CYSTOSCOPY W/ RETROGRADES Bilateral 11/25/2016   Procedure: CYSTOSCOPY WITH RETROGRADE PYELOGRAM;  Surgeon: Hollice Espy, MD;  Location: [facility] ORS;  Service: Urology;  Laterality: Bilateral;  . EYE SURGERY     lasix eye surgery    Home Medications:  Allergies as of 06/27/2020      Reactions   Trazodone And Nefazodone Other (See Comments)   Syncope; see in ER Feb 2018   Benicar [olmesartan] Cough   Statins       Medication List       Accurate as of June 27, 2020 10:46 AM. If you have any questions, ask your nurse or doctor.        STOP taking these medications   aspirin EC 81 MG tablet Stopped by: Hollice Espy, MD   busPIRone 5 MG tablet Commonly known as: BUSPAR Stopped by: Hollice Espy, MD     TAKE these medications   amLODipine 2.5 MG tablet Commonly known as: NORVASC TAKE 3 TABLETS(7.5 MG) BY MOUTH DAILY   ezetimibe 10 MG tablet Commonly known as: Zetia Take 1 tablet (10 mg total) by mouth daily. For cholesterol   glucose blood test strip Commonly known as: OneTouch Verio Check fingerstick blood sugars once a day before breakfast; Dx E11.9; LON 99 months   Melatonin 5 MG Caps Take 5 mg by mouth daily.   multivitamin tablet Take 1 tablet by mouth  daily.   onetouch ultrasoft lancets Check fingerstick blood sugars once a day before breakfast; Dx E11.9, LON 99 months   potassium chloride 10 MEQ tablet Commonly known as: KLOR-CON Take 10 mEq by mouth daily.   Vitamin D3 50 MCG (2000 UT) Tabs Take 1 capsule by mouth daily.       Allergies:  Allergies  Allergen Reactions  . Trazodone And Nefazodone Other (See Comments)    Syncope; see in ER Feb 2018  . Benicar [Olmesartan] Cough  . Statins     Family History: Family History  Problem Relation Age of Onset  . Arthritis Mother   . COPD Mother   . Depression Mother   . Diabetes Mother   . Hyperlipidemia Mother   .  Hypertension Mother   . Cataracts Mother   . Heart disease Mother   . Hearing loss Father   . Kidney disease Father   . Cataracts Father   . Cancer - Prostate Father        kidney and prostate cancer, colon cancer  . Asthma Sister   . Cancer Paternal Grandmother   . Mental illness Sister   . Breast cancer Other     Social History:  reports that she has quit smoking. She has never used smokeless tobacco. She reports current alcohol use. She reports that she does not use drugs.   Physical Exam: BP 138/86   Pulse 76   Ht 5' 4"  (1.626 m)   Wt 151 lb (68.5 kg)   BMI 25.92 kg/m   Constitutional:  Alert and oriented, No acute distress. HEENT: [place] AT, moist mucus membranes.  Trachea midline, no masses. Cardiovascular: No clubbing, cyanosis, or edema. Respiratory: Normal respiratory effort, no increased work of breathing. GI: Abdomen is soft, nontender, nondistended GU: No CVA tenderness Skin: No rashes, bruises or suspicious lesions. Neurologic: Grossly intact, no focal deficits, moving all 4 extremities. Psychiatric: Normal mood and affect.  Laboratory Data:  Lab Results  Component Value Date   CREATININE 1.03 (H) 05/25/2020   Lab Results  Component Value Date   HGBA1C 6.8 (A) 04/13/2020    Urinalysis Negative   Assessment & Plan:    1. Flank pain/ RLQ pain UA today is negative which is reassuring (no evidence of blood or ongoing infection) Suspect previous symptoms related to infection based on the appearance of the urinalysis and the fact that improved with antibiotics Based on her history of gross hematuria in the remote past as well as history of Crohn's disease, I did offer a renal ultrasound as utmost precaution to ensure that she does not have any upper tract issues.  She is agreeable this plan.  We will call her with the results. At this point time, I see no role for cystoscopy.  If she does have incidental microscopic hematuria or another episode of gross  hematuria in the future, could consider at that point.  2. Remote history of gross hematuria  No recent episode of gross hematuria    [facility] [address] [place], [place] [address] [phone]  I, Selena Batten, am acting as a scribe for Dr. Hollice Espy.  I have reviewed the above documentation for accuracy and completeness, and I agree with the above.   Hollice Espy, MD

## 2020-06-27 ENCOUNTER — Ambulatory Visit: Payer: Managed Care, Other (non HMO) | Admitting: Urology

## 2020-06-27 ENCOUNTER — Other Ambulatory Visit: Payer: Self-pay

## 2020-06-27 ENCOUNTER — Encounter: Payer: Self-pay | Admitting: Urology

## 2020-06-27 VITALS — BP 138/86 | HR 76 | Ht 64.0 in | Wt 151.0 lb

## 2020-06-27 DIAGNOSIS — R109 Unspecified abdominal pain: Secondary | ICD-10-CM | POA: Diagnosis not present

## 2020-06-28 ENCOUNTER — Other Ambulatory Visit: Payer: Self-pay | Admitting: Family Medicine

## 2020-06-28 DIAGNOSIS — I10 Essential (primary) hypertension: Secondary | ICD-10-CM

## 2020-06-28 LAB — URINALYSIS, COMPLETE
Bilirubin, UA: NEGATIVE
Glucose, UA: NEGATIVE
Ketones, UA: NEGATIVE
Leukocytes,UA: NEGATIVE
Nitrite, UA: NEGATIVE
Protein,UA: NEGATIVE
Specific Gravity, UA: 1.025 (ref 1.005–1.030)
Urobilinogen, Ur: 0.2 mg/dL (ref 0.2–1.0)
pH, UA: 5.5 (ref 5.0–7.5)

## 2020-06-28 LAB — MICROSCOPIC EXAMINATION

## 2020-07-04 ENCOUNTER — Other Ambulatory Visit: Payer: Self-pay | Admitting: Family Medicine

## 2020-07-04 DIAGNOSIS — I10 Essential (primary) hypertension: Secondary | ICD-10-CM

## 2020-07-17 NOTE — Progress Notes (Signed)
Name: Samantha Stephens   MRN: [id number]    DOB: June 20, 1961   Date:07/18/2020       Progress Note  Subjective  Chief Complaint  Chief Complaint  Patient presents with  . Anxiety  . Sleep Apnea  . Hypertension  . Hyperlipidemia  . Diabetes    HPI  DMII: she is on diet only, A1C still at goal today , not on ARB because of history of angioedema . She has dyslipidemia but unable to tolerate statins because it causes severe myalgia, we gave her Zetia and last labs showed mild improvement, but not at goal, triglycerides also high, discussed adding Vascepa for cardiovascular protection, discussed medications for DM, but she states she rather hold off on adding medication. Did not tolerate Metformin in the past caused indigestion and diarrhea.   False positive hepatitis C screen: with negative quantitative test back in 01/12/2017, repeated in 05/2020 but confirmatory test was not done , liver enzymes normal, we will contact lab  HTN: she has been taking norvasc daily, and bp is at goal, not on ARB because angioedema. She denies chest pain or dizziness. She has intermittent palpitation but not sure if secondary to anxiety. Palpitation is not associated with sob or diaphoresis   Anxiety: she seems to getting more nervous lately, she has been given zoloft , hydroxyzine . Symptoms started after her husband died Jan 12, 2018. She felt like she was doing well but has been anxious again. She does not like medication, she used Ambien in the past half a pill and worked well for her  Vitamin D and B12 : on supplementation   Insomnia: only sleeping about 5 hours per night, she states melatonin helps her fall but not stay asleep, discussed melatonin 3 am. Discussed mindfulness exercises   OSA: she wears CPAP every night., she states does not sleep well without it   Trace of blood on urine: she was seen by GI for RLQ pain, chron's in remission, she is now seeing Dr. Erlene Quan and will have renal US done October 1st 2021    Patient Active Problem List   Diagnosis Date Noted  . Vitamin D deficiency 01/07/2020  . Vitamin B12 deficiency 01/07/2020  . Osteoarthritis of hip 11/12/2019  . DDD (degenerative disc disease), cervical 04/19/2019  . Hyperlipidemia LDL goal <70 12/16/2018  . Grief reaction 06/01/2018  . Type 2 diabetes mellitus ([place]) 04/22/2018  . OSA on CPAP 09/15/2017  . Chronic hip pain, right 09/09/2016  . Numbness of right foot 09/09/2016  . Scoliosis 03/08/2016  . 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 Situational anxiety 10/27/2015  . Hypertension goal BP (blood pressure) < 130/80 08/18/2015  . Insomnia, controlled 08/18/2015    Past Surgical History:  Procedure Laterality Date  . ABDOMINAL HYSTERECTOMY    . CESAREAN SECTION     1  . COLONOSCOPY    . CYSTOSCOPY W/ RETROGRADES Bilateral 11/25/2016   Procedure: CYSTOSCOPY WITH RETROGRADE PYELOGRAM;  Surgeon: Hollice Espy, MD;  Location: [facility] ORS;  Service: Urology;  Laterality: Bilateral;  . EYE SURGERY     lasix eye surgery    Family History  Problem Relation Age of Onset  . Arthritis Mother   . COPD Mother   . Depression Mother   . Diabetes Mother   . Hyperlipidemia Mother   . Hypertension Mother   . Cataracts Mother   . Heart disease Mother   . Hearing loss Father   . Kidney disease Father   . Cataracts  Father   . Cancer - Prostate Father        kidney and prostate cancer, colon cancer  . Asthma Sister   . Cancer Paternal Grandmother   . Mental illness Sister   . Breast cancer Other     Social History   Tobacco Use  . Smoking status: Former Research scientist (life sciences)  . Smokeless tobacco: Never Used  . Tobacco comment: but only for a short amount of time  Substance Use Topics  . Alcohol use: Yes    Alcohol/week: 0.0 - 1.0 standard drinks    Comment: occasional glass of wine     Current Outpatient Medications:  .  amLODipine (NORVASC) 2.5 MG tablet, TAKE 3 TABLETS BY MOUTH ONCE DAILY, Disp: 270 tablet, Rfl: 0 .   Cholecalciferol (VITAMIN D3) 2000 units TABS, Take 1 capsule by mouth daily., Disp: , Rfl:  .  ezetimibe (ZETIA) 10 MG tablet, Take 1 tablet (10 mg total) by mouth daily. For cholesterol, Disp: 90 tablet, Rfl: 3 .  glucose blood (ONETOUCH VERIO) test strip, Check fingerstick blood sugars once a day before breakfast; Dx E11.9; LON 99 months, Disp: 100 each, Rfl: 3 .  Lancets (ONETOUCH ULTRASOFT) lancets, Check fingerstick blood sugars once a day before breakfast; Dx E11.9, LON 99 months, Disp: 100 each, Rfl: 3 .  methylPREDNISolone (MEDROL DOSEPAK) 4 MG TBPK tablet, Take by mouth as directed., Disp: , Rfl:  .  Multiple Vitamin (MULTIVITAMIN) tablet, Take 1 tablet by mouth daily., Disp: , Rfl:  .  Melatonin 5 MG CAPS, Take 5 mg by mouth daily.  (Patient not taking: Reported on 07/18/2020), Disp: , Rfl:  .  potassium chloride (KLOR-CON) 10 MEQ tablet, Take 10 mEq by mouth daily. (Patient not taking: Reported on 07/18/2020), Disp: , Rfl:   Allergies  Allergen Reactions  . Trazodone And Nefazodone Other (See Comments)    Syncope; see in ER Feb 2018  . Benicar [Olmesartan] Cough  . Statins     I personally reviewed active problem list, medication list, allergies, family history, social history, health maintenance with the patient/caregiver today.   ROS  Constitutional: Negative for fever or weight change.  Respiratory: Negative for cough and shortness of breath.   Cardiovascular: Negative for chest pain or palpitations.  Gastrointestinal: Negative for abdominal pain, no bowel changes.  Musculoskeletal: Negative for gait problem or joint swelling.  Skin: Negative for rash.  Neurological: Negative for dizziness or headache.  No other specific complaints in a complete review of systems (except as listed in HPI above).  Objective  Vitals:   07/18/20 1328  BP: 118/80  Pulse: 84  Resp: 16  Temp: 98.3 F (36.8 C)  TempSrc: Oral  SpO2: 99%  Weight: 153 lb 9.6 oz (69.7 kg)  Height: 5' 4"   (1.626 m)    Body mass index is 26.37 kg/m.  Physical Exam  Constitutional: Patient appears well-developed and well-nourished.  No distress.  HEENT: head atraumatic, normocephalic, pupils equal and reactive to light, neck supple, Cardiovascular: Normal rate, regular rhythm and normal heart sounds.  No murmur heard. No BLE edema. Pulmonary/Chest: Effort normal and breath sounds normal. No respiratory distress. Abdominal: Soft.  There is no tenderness. Psychiatric: Patient has a normal mood and affect. behavior is normal. Judgment and thought content normal.  Recent Results (from the past 2160 hour(s))  Lipid panel     Status: Abnormal   Collection Time: 05/25/20  3:45 PM  Result Value Ref Range   Cholesterol, Total 238 (H) 100 -  199 mg/dL   Triglycerides 393 (H) 0 - 149 mg/dL   HDL 52 >39 mg/dL   VLDL Cholesterol Cal 68 (H) 5 - 40 mg/dL   LDL Chol Calc (NIH) 118 (H) 0 - 99 mg/dL   Chol/HDL Ratio 4.6 (H) 0.0 - 4.4 ratio    Comment:                                   T. Chol/HDL Ratio                                             Men  Women                               1/2 Avg.Risk  3.4    3.3                                   Avg.Risk  5.0    4.4                                2X Avg.Risk  9.6    7.1                                3X Avg.Risk 23.4   11.0   CBC with Differential/Platelet     Status: Abnormal   Collection Time: 05/25/20  3:45 PM  Result Value Ref Range   WBC 7.5 3.4 - 10.8 x10E3/uL   RBC 5.23 3.77 - 5.28 x10E6/uL   Hemoglobin 13.2 11.1 - 15.9 g/dL   Hematocrit 40.2 34.0 - 46.6 %   MCV 77 (L) 79 - 97 fL   MCH 25.2 (L) 26.6 - 33.0 pg   MCHC 32.8 31 - 35 g/dL   RDW 15.0 11.7 - 15.4 %   Platelets 288 150 - 450 x10E3/uL   Neutrophils 43 Not Estab. %   Lymphs 43 Not Estab. %   Monocytes 6 Not Estab. %   Eos 7 Not Estab. %   Basos 1 Not Estab. %   Neutrophils Absolute 3.2 1 - 7 x10E3/uL   Lymphocytes Absolute 3.2 (H) 0 - 3 x10E3/uL   Monocytes Absolute 0.4 0 - 0  x10E3/uL   EOS (ABSOLUTE) 0.5 (H) 0.0 - 0.4 x10E3/uL   Basophils Absolute 0.1 0 - 0 x10E3/uL   Immature Granulocytes 0 Not Estab. %   Immature Grans (Abs) 0.0 0.0 - 0.1 x10E3/uL  Comprehensive metabolic panel     Status: Abnormal   Collection Time: 05/25/20  3:45 PM  Result Value Ref Range   Glucose 108 (H) 65 - 99 mg/dL   BUN 18 6 - 24 mg/dL   Creatinine, Ser 1.03 (H) 0.57 - 1.00 mg/dL   GFR calc non Af Amer 60 >59 mL/min/1.73   GFR calc Af Amer 69 >59 mL/min/1.73    Comment: **[facility] currently reports eGFR in compliance with the current**   recommendations of the [employer]. [facility] will   update reporting as new guidelines are published from the NKF-ASN   Task force.  BUN/Creatinine Ratio 17 9 - 23   Sodium 141 134 - 144 mmol/L   Potassium 4.1 3.5 - 5.2 mmol/L   Chloride 100 96 - 106 mmol/L   CO2 25 20 - 29 mmol/L   Calcium 10.0 8.7 - 10.2 mg/dL   Total Protein 7.1 6.0 - 8.5 g/dL   Albumin 4.9 3.8 - 4.9 g/dL   Globulin, Total 2.2 1.5 - 4.5 g/dL   Albumin/Globulin Ratio 2.2 1.2 - 2.2   Bilirubin Total 0.2 0.0 - 1.2 mg/dL   Alkaline Phosphatase 106 48 - 121 IU/L   AST 24 0 - 40 IU/L   ALT 21 0 - 32 IU/L  B12 and Folate Panel     Status: Abnormal   Collection Time: 05/25/20  3:45 PM  Result Value Ref Range   Vitamin B-12 1,287 (H) 232 - 1,245 pg/mL   Folate >20.0 >3.0 ng/mL    Comment: A serum folate concentration of less than 3.1 ng/mL is considered to represent clinical deficiency.   VITAMIN D 25 Hydroxy (Vit-D Deficiency, Fractures)     Status: None   Collection Time: 05/25/20  3:45 PM  Result Value Ref Range   Vit D, 25-Hydroxy 35.4 30.0 - 100.0 ng/mL    Comment: Vitamin D deficiency has been defined by the [place] practice guideline as a level of serum 25-OH vitamin D less than 20 ng/mL (1,2). The Endocrine Society went on to further define vitamin D insufficiency as a level between 21 and 29 ng/mL (2). 1.  IOM (Institute of Medicine). 2010. Dietary reference    intakes for calcium and D. [place]: The    [employer]. 2. Holick MF, Binkley Morse, Bischoff-Ferrari HA, et al.    Evaluation, treatment, and prevention of vitamin D    deficiency: an Endocrine Society clinical practice    guideline. JCEM. 2011 Jul; 96(7):1911-30.   TSH     Status: None   Collection Time: 05/25/20  3:45 PM  Result Value Ref Range   TSH 1.690 0.450 - 4.500 uIU/mL  RPR     Status: None   Collection Time: 05/25/20  3:45 PM  Result Value Ref Range   RPR Ser Ql Non Reactive Non Reactive  HIV Antibody (routine testing w rflx)     Status: None   Collection Time: 05/25/20  3:45 PM  Result Value Ref Range   HIV Screen 4th Generation wRfx Non Reactive Non Reactive  Hepatitis panel, acute     Status: Abnormal   Collection Time: 05/25/20  3:45 PM  Result Value Ref Range   Hep A IgM Negative Negative   Hepatitis B Surface Ag Negative Negative   Hep B C IgM Negative Negative   Hep C Virus Ab 2.1 (H) 0.0 - 0.9 s/co ratio    Comment:                                   Negative:     < 0.8                              Indeterminate: 0.8 - 0.9                                   Positive:     >  0.9  The CDC recommends that a positive HCV antibody result  be followed up with a HCV Nucleic Acid Amplification  test (371696).   Urinalysis, Complete     Status: Abnormal   Collection Time: 06/27/20 10:18 AM  Result Value Ref Range   Specific Gravity, UA 1.025 1.005 - 1.030   pH, UA 5.5 5.0 - 7.5   Color, UA Yellow Yellow   Appearance Ur Clear Clear   Leukocytes,UA Negative Negative   Protein,UA Negative Negative/Trace   Glucose, UA Negative Negative   Ketones, UA Negative Negative   RBC, UA Trace (A) Negative   Bilirubin, UA Negative Negative   Urobilinogen, Ur 0.2 0.2 - 1.0 mg/dL   Nitrite, UA Negative Negative   Microscopic Examination See below:   Microscopic Examination     Status: None   Collection  Time: 06/27/20 10:18 AM   Urine  Result Value Ref Range   WBC, UA 0-5 0 - 5 /hpf   RBC 0-2 0 - 2 /hpf   Epithelial Cells (non renal) 0-10 0 - 10 /hpf   Bacteria, UA Few None seen/Few  POCT HgB A1C     Status: Abnormal   Collection Time: 07/18/20  1:33 PM  Result Value Ref Range   Hemoglobin A1C 6.9 (A) 4.0 - 5.6 %   HbA1c POC (<> result, manual entry)     HbA1c, POC (prediabetic range)     HbA1c, POC (controlled diabetic range)        PHQ2/9: Depression screen [facility] 2/9 07/18/2020 05/19/2020 04/13/2020 10/04/2019 04/19/2019  Decreased Interest 0 3 0 0 0  Down, Depressed, Hopeless 0 1 0 0 0  PHQ - 2 Score 0 4 0 0 0  Altered sleeping - 2 0 0 0  Tired, decreased energy - 1 0 0 0  Change in appetite - 0 0 0 0  Feeling bad or failure about yourself  - 0 0 0 0  Trouble concentrating - 1 0 0 0  Moving slowly or fidgety/restless - 0 0 0 0  Suicidal thoughts - 0 0 0 0  PHQ-9 Score - 8 0 0 0  Difficult doing work/chores - - - Not difficult at all Not difficult at all  Some recent data might be hidden    phq 9 is negative   Fall Risk: Fall Risk  07/18/2020 05/19/2020 04/13/2020 10/04/2019 04/19/2019  Falls in the past year? 0 0 0 0 0  Number falls in past yr: 0 0 0 0 0  Injury with Fall? 0 0 0 0 0     Functional Status Survey: Is the patient deaf or have difficulty hearing?: No Does the patient have difficulty seeing, even when wearing glasses/contacts?: No Does the patient have difficulty concentrating, remembering, or making decisions?: No Does the patient have difficulty walking or climbing stairs?: No Does the patient have difficulty dressing or bathing?: No Does the patient have difficulty doing errands alone such as visiting a doctor's office or shopping?: No    Assessment & Plan  1. Type 2 diabetes mellitus without complication, without long-term current use of insulin (HCC)  - POCT HgB A1C - Urine Microalbumin w/creat. ratio  2. Need for immunization against  influenza  - Flu Vaccine QUAD 36+ mos IM  3. Dyslipidemia associated with type 2 diabetes mellitus (HCC)  - VASCEPA 1 g capsule; Take 2 capsules (2 g total) by mouth 2 (two) times daily.  Dispense: 360 capsule; Refill: 1  4. Essential hypertension  At  goal   5. Psychophysiological insomnia  Discussed mindfulness and melatonin 3 am   6. Hyperlipidemia LDL goal <70   7. Statin myopathy   8. Hypertension goal BP (blood pressure) < 130/80   9. DDD (degenerative disc disease), cervical  Stable  10. Chronic right hip pain

## 2020-07-18 ENCOUNTER — Ambulatory Visit: Payer: Managed Care, Other (non HMO) | Admitting: Family Medicine

## 2020-07-18 ENCOUNTER — Encounter: Payer: Self-pay | Admitting: Family Medicine

## 2020-07-18 ENCOUNTER — Other Ambulatory Visit: Payer: Self-pay

## 2020-07-18 VITALS — BP 118/80 | HR 84 | Temp 98.3°F | Resp 16 | Ht 64.0 in | Wt 153.6 lb

## 2020-07-18 DIAGNOSIS — G8929 Other chronic pain: Secondary | ICD-10-CM

## 2020-07-18 DIAGNOSIS — M5030 Other cervical disc degeneration, unspecified cervical region: Secondary | ICD-10-CM

## 2020-07-18 DIAGNOSIS — I10 Essential (primary) hypertension: Secondary | ICD-10-CM | POA: Diagnosis not present

## 2020-07-18 DIAGNOSIS — F5104 Psychophysiologic insomnia: Secondary | ICD-10-CM

## 2020-07-18 DIAGNOSIS — E785 Hyperlipidemia, unspecified: Secondary | ICD-10-CM

## 2020-07-18 DIAGNOSIS — E1169 Type 2 diabetes mellitus with other specified complication: Secondary | ICD-10-CM | POA: Diagnosis not present

## 2020-07-18 DIAGNOSIS — R768 Other specified abnormal immunological findings in serum: Secondary | ICD-10-CM

## 2020-07-18 DIAGNOSIS — M25551 Pain in right hip: Secondary | ICD-10-CM

## 2020-07-18 DIAGNOSIS — Z23 Encounter for immunization: Secondary | ICD-10-CM

## 2020-07-18 DIAGNOSIS — T466X5A Adverse effect of antihyperlipidemic and antiarteriosclerotic drugs, initial encounter: Secondary | ICD-10-CM

## 2020-07-18 DIAGNOSIS — R7689 Other specified abnormal immunological findings in serum: Secondary | ICD-10-CM

## 2020-07-18 DIAGNOSIS — G720 Drug-induced myopathy: Secondary | ICD-10-CM

## 2020-07-18 LAB — POCT GLYCOSYLATED HEMOGLOBIN (HGB A1C): Hemoglobin A1C: 6.9 % — AB (ref 4.0–5.6)

## 2020-07-18 MED ORDER — VASCEPA 1 G PO CAPS: 2.0000 g | ORAL_CAPSULE | Freq: Two times a day (BID) | ORAL | 1 refills | Status: DC

## 2020-07-18 NOTE — Patient Instructions (Signed)
Melatonin 3 am

## 2020-07-21 ENCOUNTER — Ambulatory Visit: Payer: Managed Care, Other (non HMO)

## 2020-08-14 ENCOUNTER — Ambulatory Visit (INDEPENDENT_AMBULATORY_CARE_PROVIDER_SITE_OTHER): Payer: Managed Care, Other (non HMO) | Admitting: Internal Medicine

## 2020-08-14 VITALS — BP 119/82 | HR 70 | Ht 64.0 in | Wt 153.0 lb

## 2020-08-14 DIAGNOSIS — E663 Overweight: Secondary | ICD-10-CM

## 2020-08-14 DIAGNOSIS — F5105 Insomnia due to other mental disorder: Secondary | ICD-10-CM | POA: Diagnosis not present

## 2020-08-14 DIAGNOSIS — Z7189 Other specified counseling: Secondary | ICD-10-CM | POA: Insufficient documentation

## 2020-08-14 DIAGNOSIS — Z9989 Dependence on other enabling machines and devices: Secondary | ICD-10-CM

## 2020-08-14 DIAGNOSIS — G4733 Obstructive sleep apnea (adult) (pediatric): Secondary | ICD-10-CM | POA: Diagnosis not present

## 2020-08-14 NOTE — Progress Notes (Signed)
[facility] [place], [place] [address]  Pulmonary Sleep Medicine   Office Visit Note  Patient Name: Samantha Stephens DOB: 1961/09/08 MRN [id number]    Chief Complaint: Obstructive Sleep Apnea visit  Brief History:  Odyssey is seen today for initial consultation The patient has a 13 year history of sleep apnea. Patient is using PAP nightly.  She has not been sleeping well since her husband died 2 years ago. She was in a grief support group  She can  Fall asleep without difficulty then will wake after 4-5 hours. Sometimes she goes back to sleep but other times she remains awake watching TV. She goes to bed around 11-midnight. She has to log on to work at 7 a.m.  The patient feels more rested after sleeping with PAP.  The patient reports benefiting from PAP use. Reported sleepiness is  improved and the Epworth Sleepiness Score is 2 out of 24. The patient occasionally will take naps. The patient complains of the following: no complaints  The compliance download shows good compliance with an average use time of 5.5 hours. The AHI is 0.6  The patient does not complain of limb movements disrupting sleep.  ROS  General: (-) fever, (-) chills, (-) night sweat Nose and Sinuses: (-) nasal stuffiness or itchiness, (-) postnasal drip, (-) nosebleeds, (-) sinus trouble. Mouth and Throat: (-) sore throat, (-) hoarseness. Neck: (-) swollen glands, (-) enlarged thyroid, (-) neck pain. Respiratory: - cough, - shortness of breath, - wheezing. Neurologic: - numbness, - tingling. Psychiatric: - anxiety, - depression   Current Medication: Outpatient Encounter Medications as of 08/14/2020  Medication Sig  . amLODipine (NORVASC) 2.5 MG tablet TAKE 3 TABLETS BY MOUTH ONCE DAILY  . Cholecalciferol (VITAMIN D3) 2000 units TABS Take 1 capsule by mouth daily.  Marland Kitchen ezetimibe (ZETIA) 10 MG tablet Take 1 tablet (10 mg total) by mouth daily. For cholesterol  . glucose blood (ONETOUCH  VERIO) test strip Check fingerstick blood sugars once a day before breakfast; Dx E11.9; LON 99 months  . Lancets (ONETOUCH ULTRASOFT) lancets Check fingerstick blood sugars once a day before breakfast; Dx E11.9, LON 99 months  . Multiple Vitamin (MULTIVITAMIN) tablet Take 1 tablet by mouth daily.  Marland Kitchen VASCEPA 1 g capsule Take 2 capsules (2 g total) by mouth 2 (two) times daily.  . [DISCONTINUED] methylPREDNISolone (MEDROL DOSEPAK) 4 MG TBPK tablet Take by mouth as directed.   No facility-administered encounter medications on file as of 08/14/2020.    Surgical History: Past Surgical History:  Procedure Laterality Date  . ABDOMINAL HYSTERECTOMY    . CESAREAN SECTION     1  . COLONOSCOPY    . CYSTOSCOPY W/ RETROGRADES Bilateral 11/25/2016   Procedure: CYSTOSCOPY WITH RETROGRADE PYELOGRAM;  Surgeon: Hollice Espy, MD;  Location: [facility] ORS;  Service: Urology;  Laterality: Bilateral;  . EYE SURGERY     lasix eye surgery    Medical History: Past Medical History:  Diagnosis Date  . Arthritis   . Chronic mid back pain 08/18/2015  . 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 Family hx of colon cancer requiring screening colonoscopy 03/08/2016  . Headache   . Hyperlipidemia   . Hypertension   . Hypokalemia   . Insomnia related to another mental disorder   . Palpitations   . Scoliosis 03/08/2016  . Sleep apnea   . Stress headaches     Family History: Non contributory to the present illness  Social History: Social History  Socioeconomic History  . Marital status: Widowed    Spouse name: Not on file  . Number of children: 1  . Years of education: Not on file  . Highest education level: Bachelor's degree (e.g., BA, AB, BS)  Occupational History  . Occupation: IT   Tobacco Use  . Smoking status: Former Research scientist (life sciences)  . Smokeless tobacco: Never Used  . Tobacco comment: but only for a short amount of time  Vaping Use  . Vaping Use: Never used  Substance and Sexual Activity  .  Alcohol use: Yes    Alcohol/week: 0.0 - 1.0 standard drinks    Comment: occasional glass of wine  . Drug use: No  . Sexual activity: Yes    Partners: Male  Other Topics Concern  . Not on file  Social History Narrative  . Not on file   Social Determinants of Health   Financial Resource Strain: Low Risk   . Difficulty of Paying Living Expenses: Not hard at all  Food Insecurity: No Food Insecurity  . Worried About Charity fundraiser in the Last Year: Never true  . Ran Out of Food in the Last Year: Never true  Transportation Needs: No Transportation Needs  . Lack of Transportation (Medical): No  . Lack of Transportation (Non-Medical): No  Physical Activity: Sufficiently Active  . Days of Exercise per Week: 6 days  . Minutes of Exercise per Session: 60 min  Stress: Stress Concern Present  . Feeling of Stress : To some extent  Social Connections: Moderately Isolated  . Frequency of Communication with Friends and Family: More than three times a week  . Frequency of Social Gatherings with Friends and Family: Once a week  . Attends Religious Services: Never  . Active Member of Clubs or Organizations: Yes  . Attends Archivist Meetings: More than 4 times per year  . Marital Status: Widowed  Intimate Partner Violence: Not At Risk  . Fear of Current or Ex-Partner: No  . Emotionally Abused: No  . Physically Abused: No  . Sexually Abused: No    Vital Signs: Blood pressure 119/82, pulse 70, height 5' 4"  (1.626 m), weight 153 lb (69.4 kg), SpO2 99 %.  Examination: General Appearance: The patient is well-developed, well-nourished, and in no distress. Neck Circumference: 36 Skin: Gross inspection of skin unremarkable. Head: normocephalic, no gross deformities. Eyes: no gross deformities noted. ENT: ears appear grossly normal Neurologic: Alert and oriented. No involuntary movements.    EPWORTH SLEEPINESS SCALE:  Scale:  (0)= no chance of dozing; (1)= slight chance of  dozing; (2)= moderate chance of dozing; (3)= high chance of dozing  Chance  Situtation    Sitting and reading: 1    Watching TV: 0    Sitting Inactive in public: 0    As a passenger in car: 0      Lying down to rest: 1    Sitting and talking: 0    Sitting quielty after lunch: 0    In a car, stopped in traffic: 0   TOTAL SCORE:   2 out of 24    SLEEP STUDIES:  1. PSg 04/14/07 AHI 10 SpO36mn 84%   CPAP COMPLIANCE DATA:  Date Range: 08/15/19-08/13/20  Average Daily Use: 5.5 hours  Median Use: 5.6  Compliance for > 4 Hours: 81%  AHI: 0.6 respiratory events per hour  Days Used: 351/365  Mask Leak: 18.3  95th Percentile Pressure: 11.3         LABS: Recent  Results (from the past 2160 hour(s))  Lipid panel     Status: Abnormal   Collection Time: 05/25/20  3:45 PM  Result Value Ref Range   Cholesterol, Total 238 (H) 100 - 199 mg/dL   Triglycerides 393 (H) 0 - 149 mg/dL   HDL 52 >39 mg/dL   VLDL Cholesterol Cal 68 (H) 5 - 40 mg/dL   LDL Chol Calc (NIH) 118 (H) 0 - 99 mg/dL   Chol/HDL Ratio 4.6 (H) 0.0 - 4.4 ratio    Comment:                                   T. Chol/HDL Ratio                                             Men  Women                               1/2 Avg.Risk  3.4    3.3                                   Avg.Risk  5.0    4.4                                2X Avg.Risk  9.6    7.1                                3X Avg.Risk 23.4   11.0   CBC with Differential/Platelet     Status: Abnormal   Collection Time: 05/25/20  3:45 PM  Result Value Ref Range   WBC 7.5 3.4 - 10.8 x10E3/uL   RBC 5.23 3.77 - 5.28 x10E6/uL   Hemoglobin 13.2 11.1 - 15.9 g/dL   Hematocrit 40.2 34.0 - 46.6 %   MCV 77 (L) 79 - 97 fL   MCH 25.2 (L) 26.6 - 33.0 pg   MCHC 32.8 31 - 35 g/dL   RDW 15.0 11.7 - 15.4 %   Platelets 288 150 - 450 x10E3/uL   Neutrophils 43 Not Estab. %   Lymphs 43 Not Estab. %   Monocytes 6 Not Estab. %   Eos 7 Not Estab. %   Basos 1 Not  Estab. %   Neutrophils Absolute 3.2 1.40 - 7.00 x10E3/uL   Lymphocytes Absolute 3.2 (H) 0 - 3 x10E3/uL   Monocytes Absolute 0.4 0 - 0 x10E3/uL   EOS (ABSOLUTE) 0.5 (H) 0.0 - 0.4 x10E3/uL   Basophils Absolute 0.1 0 - 0 x10E3/uL   Immature Granulocytes 0 Not Estab. %   Immature Grans (Abs) 0.0 0.0 - 0.1 x10E3/uL  Comprehensive metabolic panel     Status: Abnormal   Collection Time: 05/25/20  3:45 PM  Result Value Ref Range   Glucose 108 (H) 65 - 99 mg/dL   BUN 18 6 - 24 mg/dL   Creatinine, Ser 1.03 (H) 0.57 - 1.00 mg/dL   GFR calc non Af Amer 60 >59 mL/min/1.73   GFR calc Af Amer 69 >59 mL/min/1.73  Comment: **[facility] currently reports eGFR in compliance with the current**   recommendations of the [employer]. [facility] will   update reporting as new guidelines are published from the NKF-ASN   Task force.    BUN/Creatinine Ratio 17 9 - 23   Sodium 141 134 - 144 mmol/L   Potassium 4.1 3.5 - 5.2 mmol/L   Chloride 100 96 - 106 mmol/L   CO2 25 20 - 29 mmol/L   Calcium 10.0 8.7 - 10.2 mg/dL   Total Protein 7.1 6.0 - 8.5 g/dL   Albumin 4.9 3.8 - 4.9 g/dL   Globulin, Total 2.2 1.5 - 4.5 g/dL   Albumin/Globulin Ratio 2.2 1.2 - 2.2   Bilirubin Total 0.2 0.0 - 1.2 mg/dL   Alkaline Phosphatase 106 48 - 121 IU/L   AST 24 0 - 40 IU/L   ALT 21 0 - 32 IU/L  B12 and Folate Panel     Status: Abnormal   Collection Time: 05/25/20  3:45 PM  Result Value Ref Range   Vitamin B-12 1,287 (H) 232 - 1,245 pg/mL   Folate >20.0 >3.0 ng/mL    Comment: A serum folate concentration of less than 3.1 ng/mL is considered to represent clinical deficiency.   VITAMIN D 25 Hydroxy (Vit-D Deficiency, Fractures)     Status: None   Collection Time: 05/25/20  3:45 PM  Result Value Ref Range   Vit D, 25-Hydroxy 35.4 30.0 - 100.0 ng/mL    Comment: Vitamin D deficiency has been defined by the [place] practice guideline as a level of serum 25-OH vitamin D less  than 20 ng/mL (1,2). The Endocrine Society went on to further define vitamin D insufficiency as a level between 21 and 29 ng/mL (2). 1. IOM (Institute of Medicine). 2010. Dietary reference    intakes for calcium and D. [place]: The    [employer]. 2. Holick MF, Binkley Easley, Bischoff-Ferrari HA, et al.    Evaluation, treatment, and prevention of vitamin D    deficiency: an Endocrine Society clinical practice    guideline. JCEM. 2011 Jul; 96(7):1911-30.   TSH     Status: None   Collection Time: 05/25/20  3:45 PM  Result Value Ref Range   TSH 1.690 0.450 - 4.500 uIU/mL  RPR     Status: None   Collection Time: 05/25/20  3:45 PM  Result Value Ref Range   RPR Ser Ql Non Reactive Non Reactive  HIV Antibody (routine testing w rflx)     Status: None   Collection Time: 05/25/20  3:45 PM  Result Value Ref Range   HIV Screen 4th Generation wRfx Non Reactive Non Reactive  Hepatitis panel, acute     Status: Abnormal   Collection Time: 05/25/20  3:45 PM  Result Value Ref Range   Hep A IgM Negative Negative   Hepatitis B Surface Ag Negative Negative   Hep B C IgM Negative Negative   Hep C Virus Ab 2.1 (H) 0.0 - 0.9 s/co ratio    Comment:                                   Negative:     < 0.8                              Indeterminate: 0.8 - 0.9  Positive:     > 0.9  The CDC recommends that a positive HCV antibody result  be followed up with a HCV Nucleic Acid Amplification  test ([id number]).   Urinalysis, Complete     Status: Abnormal   Collection Time: 06/27/20 10:18 AM  Result Value Ref Range   Specific Gravity, UA 1.025 1.005 - 1.030   pH, UA 5.5 5.0 - 7.5   Color, UA Yellow Yellow   Appearance Ur Clear Clear   Leukocytes,UA Negative Negative   Protein,UA Negative Negative/Trace   Glucose, UA Negative Negative   Ketones, UA Negative Negative   RBC, UA Trace (A) Negative   Bilirubin, UA Negative Negative   Urobilinogen, Ur 0.2 0.2 -  1.0 mg/dL   Nitrite, UA Negative Negative   Microscopic Examination See below:   Microscopic Examination     Status: None   Collection Time: 06/27/20 10:18 AM   Urine  Result Value Ref Range   WBC, UA 0-5 0 - 5 /hpf   RBC 0-2 0 - 2 /hpf   Epithelial Cells (non renal) 0-10 0 - 10 /hpf   Bacteria, UA Few None seen/Few  POCT HgB A1C     Status: Abnormal   Collection Time: 07/18/20  1:33 PM  Result Value Ref Range   Hemoglobin A1C 6.9 (A) 4.0 - 5.6 %   HbA1c POC (<> result, manual entry)     HbA1c, POC (prediabetic range)     HbA1c, POC (controlled diabetic range)      Radiology: MM 3D SCREEN BREAST BILATERAL  Result Date: 05/05/2020 CLINICAL DATA:  Screening. EXAM: DIGITAL SCREENING BILATERAL MAMMOGRAM WITH TOMO AND CAD COMPARISON:  Previous exam(s). ACR Breast Density Category b: There are scattered areas of fibroglandular density. FINDINGS: There are no findings suspicious for malignancy. Images were processed with CAD. IMPRESSION: No mammographic evidence of malignancy. A result letter of this screening mammogram will be mailed directly to the patient. RECOMMENDATION: Screening mammogram in one year. (Code:SM-B-01Y) BI-RADS CATEGORY  1: Negative. Electronically Signed   By: Lillia Mountain M.D.   On: 05/05/2020 08:35    No results found.  No results found.    Assessment and Plan: Patient Active Problem List   Diagnosis Date Noted  . CPAP use counseling 08/14/2020  . Insomnia related to another mental disorder   . History of Crohn's disease 05/30/2020  . Vitamin D deficiency 01/07/2020  . Vitamin B12 deficiency 01/07/2020  . Osteoarthritis of hip 11/12/2019  . DDD (degenerative disc disease), cervical 04/19/2019  . Hyperlipidemia LDL goal <70 12/16/2018  . Grief reaction 06/01/2018  . Type 2 diabetes mellitus ([place]) 04/22/2018  . OSA on CPAP 09/15/2017  . Chronic hip pain, right 09/09/2016  . Numbness of right foot 09/09/2016  . Scoliosis 03/08/2016  . Degenerative  arthritis of lumbar spine 03/08/2016  . Situational anxiety 10/27/2015  . Hypertension goal BP (blood pressure) < 130/80 08/18/2015  . Insomnia, controlled 08/18/2015      The patient does tolerate PAP and reports significant benefit from PAP use. The patient was reminded how to clean the unit and advised to go to bed earlier and to follow sleep hygiene and stimulus control recommendations. The patient was also advised to enroll in CBTi. The compliance is good. The apnea is well controlled.   1. OSA- continue nightly use of CPAP 2. Insomnia related to mental disorder- enroll in CBTi. Sleep hygiene and stimulus control recommendations were reviewed 3. CPAP couseling-Discussed importance of adequate CPAP use as  well as proper care and cleaning techniques of machine and all supplies. 4. Overweight work on diet and exercise   General Counseling: I have discussed the findings of the evaluation and examination with Destane.  I have also discussed any further diagnostic evaluation thatmay be needed or ordered today. Jakiya verbalizes understanding of the findings of todays visit. We also reviewed her medications today and discussed drug interactions and side effects including but not limited excessive drowsiness and altered mental states. We also discussed that there is always a risk not just to her but also people around her. she has been encouraged to call the office with any questions or concerns that should arise related to todays visit.  This patient was seen by Shannan Harper, AGNP-C in collaboration with Dr. Devona Konig as a part of collaborative care agreement.       I have personally obtained a history, examined the patient, evaluated laboratory and imaging results, formulated the assessment and plan and placed orders.   Richelle Ito Saunders Glance, PhD, FAASM  Diplomate, American Board of Sleep Medicine    Allyne Gee, MD [facility] Diplomate ABMS Pulmonary and Critical Care Medicine Sleep  medicine

## 2020-08-14 NOTE — Patient Instructions (Signed)

## 2020-09-26 ENCOUNTER — Other Ambulatory Visit: Payer: Self-pay | Admitting: Family Medicine

## 2020-09-26 DIAGNOSIS — I10 Essential (primary) hypertension: Secondary | ICD-10-CM

## 2020-10-27 ENCOUNTER — Other Ambulatory Visit: Payer: Self-pay

## 2020-10-27 ENCOUNTER — Ambulatory Visit: Payer: Managed Care, Other (non HMO) | Admitting: Family Medicine

## 2020-10-27 ENCOUNTER — Encounter: Payer: Self-pay | Admitting: Family Medicine

## 2020-10-27 VITALS — BP 126/72 | HR 100 | Temp 97.8°F | Resp 16 | Ht 64.0 in | Wt 155.0 lb

## 2020-10-27 DIAGNOSIS — I152 Hypertension secondary to endocrine disorders: Secondary | ICD-10-CM

## 2020-10-27 DIAGNOSIS — T466X5A Adverse effect of antihyperlipidemic and antiarteriosclerotic drugs, initial encounter: Secondary | ICD-10-CM

## 2020-10-27 DIAGNOSIS — E1169 Type 2 diabetes mellitus with other specified complication: Secondary | ICD-10-CM

## 2020-10-27 DIAGNOSIS — G4700 Insomnia, unspecified: Secondary | ICD-10-CM

## 2020-10-27 DIAGNOSIS — E785 Hyperlipidemia, unspecified: Secondary | ICD-10-CM

## 2020-10-27 DIAGNOSIS — F419 Anxiety disorder, unspecified: Secondary | ICD-10-CM

## 2020-10-27 DIAGNOSIS — E1159 Type 2 diabetes mellitus with other circulatory complications: Secondary | ICD-10-CM

## 2020-10-27 DIAGNOSIS — M5030 Other cervical disc degeneration, unspecified cervical region: Secondary | ICD-10-CM

## 2020-10-27 DIAGNOSIS — G720 Drug-induced myopathy: Secondary | ICD-10-CM

## 2020-10-27 DIAGNOSIS — I10 Essential (primary) hypertension: Secondary | ICD-10-CM

## 2020-10-27 LAB — POCT GLYCOSYLATED HEMOGLOBIN (HGB A1C): Hemoglobin A1C: 7 % — AB (ref 4.0–5.6)

## 2020-10-27 MED ORDER — AMLODIPINE BESYLATE 2.5 MG PO TABS: 7.5000 mg | ORAL_TABLET | Freq: Every day | ORAL | 0 refills | Status: DC

## 2020-10-27 NOTE — Progress Notes (Signed)
Name: Samantha Stephens   MRN: [id number]    DOB: January 29, 1961   Date:10/27/2020       Progress Note  Subjective  Chief Complaint  Chief Complaint  Patient presents with  . Hypertension  . Hyperlipidemia  . Prediabetes         HPI  DMII:she is on diet only but not as compliant with her diet, A1C today was 7% , not on ARB because of history of angioedema . She has dyslipidemia but unable to tolerate statins because it causes severe myalgia, we gave her Zetia , she stopped Vascepa because of body aches but will try to resume it.. Did not tolerate Metformin in the past caused indigestion and diarrhea.   Dyslipidemia: she has been taking Zetia but stopped taking Omega 3 fatty acids , she is willing to resume it and monitor , she felt like it was making her feel achy.   False positive hepatitis C screen: with negative quantitative test back in 2016-12-17, repeated in 05/2020 but confirmatory test was not done , liver enzymes normal. We will not recheck level   HTN:she has been taking norvasc daily, and bp is at goal, not on ARB because angioedema. She denies chest pain or dizziness. She has intermittent palpitation but not sure if secondary to anxiety.She is doing well   Anxiety:  she has been given zoloft , hydroxyzine . Symptoms started after her husband died 2017-12-17. She states it was hard during the holidays but she has been able to listen to music , dancing, she spend Christmas alone but is feeling well now.   Vitamin D and B12 : on supplementation , reviewed last labs   Insomnia: she has been taking Melatonin sustained release and has been able to fall and stay asleep for 8 hours. She incorporate some sleep hygiene  OSA: she wears CPAP every night., she states does not sleep well without it   Trace of blood on urine: she was seen by GI for RLQ pain, chron's in remission, she is now seeing Dr. Erlene Quan and will have renal US done October 1st 2021  OA hip: x-ray done at [facility] clinic  back in 12/18/15, pain is worse at night and feels stiff in the mornings. Discussed Tylenol up to 3 grams per day.    Patient Active Problem List   Diagnosis Date Noted  . CPAP use counseling 08/14/2020  . Insomnia related to another mental disorder   . History of Crohn's disease 05/30/2020  . Vitamin D deficiency 01/07/2020  . Vitamin B12 deficiency 01/07/2020  . Osteoarthritis of hip 11/12/2019  . DDD (degenerative disc disease), cervical 04/19/2019  . Hyperlipidemia LDL goal <70 12/16/2018  . Grief reaction 06/01/2018  . Type 2 diabetes mellitus ([place]) 04/22/2018  . OSA on CPAP 09/15/2017  . Chronic hip pain, right 09/09/2016  . Numbness of right foot 09/09/2016  . Scoliosis 03/08/2016  . 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 Situational anxiety 10/27/2015  . Hypertension goal BP (blood pressure) < 130/80 08/18/2015  . Insomnia, controlled 08/18/2015    Past Surgical History:  Procedure Laterality Date  . ABDOMINAL HYSTERECTOMY    . CESAREAN SECTION     1  . COLONOSCOPY    . CYSTOSCOPY W/ RETROGRADES Bilateral 11/25/2016   Procedure: CYSTOSCOPY WITH RETROGRADE PYELOGRAM;  Surgeon: Hollice Espy, MD;  Location: [facility] ORS;  Service: Urology;  Laterality: Bilateral;  . EYE SURGERY     lasix eye surgery    Family History  Problem Relation Age of Onset  . Arthritis Mother   . COPD Mother   . Depression Mother   . Diabetes Mother   . Hyperlipidemia Mother   . Hypertension Mother   . Cataracts Mother   . Heart disease Mother   . Hearing loss Father   . Kidney disease Father   . Cataracts Father   . Cancer - Prostate Father        kidney and prostate cancer, colon cancer  . Asthma Sister   . Cancer Paternal Grandmother   . Mental illness Sister   . Breast cancer Other     Social History   Tobacco Use  . Smoking status: Former Research scientist (life sciences)  . Smokeless tobacco: Never Used  . Tobacco comment: but only for a short amount of time  Substance Use Topics  .  Alcohol use: Yes    Alcohol/week: 0.0 - 1.0 standard drinks    Comment: occasional glass of wine     Current Outpatient Medications:  .  amLODipine (NORVASC) 2.5 MG tablet, TAKE 3 TABLETS BY MOUTH ONCE DAILY, Disp: 270 tablet, Rfl: 0 .  Cholecalciferol (VITAMIN D3) 2000 units TABS, Take 1 capsule by mouth daily., Disp: , Rfl:  .  ezetimibe (ZETIA) 10 MG tablet, Take 1 tablet (10 mg total) by mouth daily. For cholesterol, Disp: 90 tablet, Rfl: 3 .  Multiple Vitamin (MULTIVITAMIN) tablet, Take 1 tablet by mouth daily., Disp: , Rfl:  .  VASCEPA 1 g capsule, Take 2 capsules (2 g total) by mouth 2 (two) times daily., Disp: 360 capsule, Rfl: 1 .  glucose blood (ONETOUCH VERIO) test strip, Check fingerstick blood sugars once a day before breakfast; Dx E11.9; LON 99 months (Patient not taking: Reported on 10/27/2020), Disp: 100 each, Rfl: 3 .  Lancets (ONETOUCH ULTRASOFT) lancets, Check fingerstick blood sugars once a day before breakfast; Dx E11.9, LON 99 months (Patient not taking: Reported on 10/27/2020), Disp: 100 each, Rfl: 3  Allergies  Allergen Reactions  . Trazodone And Nefazodone Other (See Comments)    Syncope; see in ER Feb 2018  . Benicar [Olmesartan] Cough  . Statins     I personally reviewed active problem list, medication list, allergies, family history, social history, health maintenance with the patient/caregiver today.   ROS  Constitutional: Negative for fever or weight change.  Respiratory: Negative for cough and shortness of breath.   Cardiovascular: Negative for chest pain or palpitations.  Gastrointestinal: Negative for abdominal pain, no bowel changes.  Musculoskeletal: Negative for gait problem or joint swelling.  Skin: Negative for rash.  Neurological: Negative for dizziness or headache.  No other specific complaints in a complete review of systems (except as listed in HPI above).  Objective  Vitals:   10/27/20 1415  BP: 126/72  Pulse: 100  Resp: 16  Temp:  97.8 F (36.6 C)  TempSrc: Oral  SpO2: 96%  Weight: 155 lb (70.3 kg)  Height: 5' 4"  (1.626 m)    Body mass index is 26.61 kg/m.  Physical Exam  Constitutional: Patient appears well-developed and well-nourished.  No distress.  HEENT: head atraumatic, normocephalic, pupils equal and reactive to light,  neck supple Cardiovascular: Normal rate, regular rhythm and normal heart sounds.  No murmur heard. No BLE edema. Pulmonary/Chest: Effort normal and breath sounds normal. No respiratory distress. Abdominal: Soft.  There is no tenderness. Psychiatric: Patient has a normal mood and affect. behavior is normal. Judgment and thought content normal.    PHQ2/9: Depression screen [facility] 2/9  10/27/2020 07/18/2020 05/19/2020 04/13/2020 10/04/2019  Decreased Interest 0 0 3 0 0  Down, Depressed, Hopeless 1 0 1 0 0  PHQ - 2 Score 1 0 4 0 0  Altered sleeping - - 2 0 0  Tired, decreased energy - - 1 0 0  Change in appetite - - 0 0 0  Feeling bad or failure about yourself  - - 0 0 0  Trouble concentrating - - 1 0 0  Moving slowly or fidgety/restless - - 0 0 0  Suicidal thoughts - - 0 0 0  PHQ-9 Score - - 8 0 0  Difficult doing work/chores - - - - Not difficult at all  Some recent data might be hidden    phq 9 is positive   Fall Risk: Fall Risk  10/27/2020 07/18/2020 05/19/2020 04/13/2020 10/04/2019  Falls in the past year? 0 0 0 0 0  Number falls in past yr: 0 0 0 0 0  Injury with Fall? 0 0 0 0 0  Follow up Falls evaluation completed - - - -     Functional Status Survey: Is the patient deaf or have difficulty hearing?: No Does the patient have difficulty seeing, even when wearing glasses/contacts?: No Does the patient have difficulty concentrating, remembering, or making decisions?: No Does the patient have difficulty walking or climbing stairs?: No Does the patient have difficulty dressing or bathing?: No Does the patient have difficulty doing errands alone such as visiting a doctor's office  or shopping?: No    Assessment & Plan  1. Hypertension associated with type 2 diabetes mellitus ([place])   2. Dyslipidemia associated with type 2 diabetes mellitus ([facility])  - POCT HgB A1C  3. Essential hypertension  - amLODipine (NORVASC) 2.5 MG tablet; Take 3 tablets (7.5 mg total) by mouth daily.  Dispense: 270 tablet; Refill: 0  4. Statin myopathy   5. Hyperlipidemia LDL goal <70   6. DDD (degenerative disc disease), cervical   7. Insomnia, controlled  Doing well now   8. Anxiety  Coping better, trying to avoid news at night

## 2021-03-06 NOTE — Progress Notes (Signed)
Name: Samantha Stephens   MRN: [id number]    DOB: January 19, 1961   Date:03/07/2021       Progress Note  Subjective  Chief Complaint  Follow Up  HPI  DMII:she is on diet only but not as compliant with her diet, however  A1C is gradually going up and today it is 7.6 % , she is  not on ARB because of history of angioedema . She has dyslipidemia but unable to tolerate statins because it causes severe myalgia, she is taking Zetia, she stopped Vascepa because of body aches but will try to resume it.. Did not tolerate Metformin in the past caused indigestion and diarrhea. We will try Wilder Glade , discussed possible side effects   Dyslipidemia: she has been taking Zetia but stopped taking Vascepa and is tolerating it well, discussed rechecking labs   False positive hepatitis C screen: with negative quantitative test back in 01-14-17, repeated in 05/2020 but confirmatory test was not done , liver enzymes normal. Unchanged   HTN:she has been taking norvasc 7.5 mg  daily, and bp is at goal, not on ARB because angioedema. She denies chest pain or dizziness. She denies any recent episodes of palpitation . She has been walking daily , we may need to go down on Norvasc from 7.5 mg to 69m since we are starting farxiga, but to do it only the week before her next visit   Anxiety: Symptoms started after her husband died 2March 27, 2019 She is doing well now, staying active, going to church, small groups, book club.   Vitamin D and B12 : on supplementation  Insomnia: she has been taking Melatonin sustained release and has been able to fall and stay asleep for 8 hours. She incorporate some sleep hygiene, stable at this time  OSA: she wears CPAP every night., she states does not sleep well without it Very compliant   Trace of blood on urine: she was seen by GI for RLQ pain, chron's in remission, she was seen by Dr. BErlene Quan  OA [facility] pain is worse at night and feels stiff in the  mornings. Discussed Tylenol up to 3 grams per day.  Unchanged    Patient Active Problem List   Diagnosis Date Noted  . CPAP use counseling 08/14/2020  . Insomnia related to another mental disorder   . History of Crohn's disease 05/30/2020  . Vitamin D deficiency 01/07/2020  . Vitamin B12 deficiency 01/07/2020  . Osteoarthritis of hip 11/12/2019  . DDD (degenerative disc disease), cervical 04/19/2019  . Hyperlipidemia LDL goal <70 12/16/2018  . Diabetes mellitus ([place] 04/22/2018  . OSA on CPAP 09/15/2017  . Chronic hip pain, right 09/09/2016  . Numbness of right foot 09/09/2016  . Scoliosis 03/08/2016  . 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 Situational anxiety 10/27/2015  . Hypertension goal BP (blood pressure) < 130/80 08/18/2015  . Insomnia, controlled 08/18/2015    Past Surgical History:  Procedure Laterality Date  . ABDOMINAL HYSTERECTOMY    . CESAREAN SECTION     1  . COLONOSCOPY    . CYSTOSCOPY W/ RETROGRADES Bilateral 11/25/2016   Procedure: CYSTOSCOPY WITH RETROGRADE PYELOGRAM;  Surgeon: AHollice Espy MD;  Location: [facility] ORS;  Service: Urology;  Laterality: Bilateral;  . EYE SURGERY     lasix eye surgery    Family History  Problem Relation Age of Onset  . Arthritis Mother   . COPD Mother   . Depression Mother   .  Diabetes Mother   . Hyperlipidemia Mother   . Hypertension Mother   . Cataracts Mother   . Heart disease Mother   . Hearing loss Father   . Kidney disease Father   . Cataracts Father   . Cancer - Prostate Father        kidney and prostate cancer, colon cancer  . Asthma Sister   . Cancer Paternal Grandmother   . Mental illness Sister   . Breast cancer Other     Social History   Tobacco Use  . Smoking status: Former Research scientist (life sciences)  . Smokeless tobacco: Never Used  . Tobacco comment: but only for a short amount of time  Substance Use Topics  . Alcohol use: Yes    Alcohol/week: 0.0 - 1.0 standard drinks    Comment: occasional glass  of wine     Current Outpatient Medications:  .  Cholecalciferol (VITAMIN D3) 2000 units TABS, Take 1 capsule by mouth daily., Disp: , Rfl:  .  dapagliflozin propanediol (FARXIGA) 10 MG TABS tablet, Take 1 tablet (10 mg total) by mouth daily before breakfast., Disp: 90 tablet, Rfl: 1 .  Melatonin CR 3 MG TBCR, Take by mouth., Disp: , Rfl:  .  Multiple Vitamin (MULTIVITAMIN) tablet, Take 1 tablet by mouth daily., Disp: , Rfl:  .  VASCEPA 1 g capsule, Take 2 capsules (2 g total) by mouth 2 (two) times daily., Disp: 360 capsule, Rfl: 1 .  amLODipine (NORVASC) 2.5 MG tablet, Take 3 tablets (7.5 mg total) by mouth daily., Disp: 270 tablet, Rfl: 0 .  ezetimibe (ZETIA) 10 MG tablet, Take 1 tablet (10 mg total) by mouth daily. For cholesterol, Disp: 90 tablet, Rfl: 3  Allergies  Allergen Reactions  . Trazodone And Nefazodone Other (See Comments)    Syncope; see in ER Feb 2018  . Benicar [Olmesartan] Cough  . Statins     I personally reviewed active problem list, medication list, allergies, family history, social history, health maintenance with the patient/caregiver today.   ROS  Constitutional: Negative for fever or weight change.  Respiratory: Negative for cough and shortness of breath.   Cardiovascular: Negative for chest pain or palpitations.  Gastrointestinal: Negative for abdominal pain, no bowel changes.  Musculoskeletal: Negative for gait problem or joint swelling.  Skin: Negative for rash.  Neurological: Negative for dizziness or headache.  No other specific complaints in a complete review of systems (except as listed in HPI above).  Objective  Vitals:   03/07/21 1510  BP: 128/80  Pulse: 86  Resp: 16  Temp: 98.3 F (36.8 C)  TempSrc: Oral  SpO2: 98%  Weight: 154 lb (69.9 kg)  Height: 5' 4"  (1.626 m)    Body mass index is 26.43 kg/m.  Physical Exam  Constitutional: Patient appears well-developed and well-nourished. Overweight.  No distress.  HEENT: head  atraumatic, normocephalic, pupils equal and reactive to light, neck supple Cardiovascular: Normal rate, regular rhythm and normal heart sounds.  No murmur heard. No BLE edema. Pulmonary/Chest: Effort normal and breath sounds normal. No respiratory distress. Abdominal: Soft.  There is no tenderness. Psychiatric: Patient has a normal mood and affect. behavior is normal. Judgment and thought content normal.  Recent Results (from the past 2160 hour(s))  POCT HgB A1C     Status: Abnormal   Collection Time: 03/07/21  3:12 PM  Result Value Ref Range   Hemoglobin A1C 7.6 (A) 4.0 - 5.6 %   HbA1c POC (<> result, manual entry)  HbA1c, POC (prediabetic range)     HbA1c, POC (controlled diabetic range)      Diabetic Foot Exam: Diabetic Foot Exam - Simple   Simple Foot Form Diabetic Foot exam was performed with the following findings: Yes 03/07/2021  3:43 PM  Visual Inspection No deformities, no ulcerations, no other skin breakdown bilaterally: Yes Sensation Testing Intact to touch and monofilament testing bilaterally: Yes Pulse Check Posterior Tibialis and Dorsalis pulse intact bilaterally: Yes Comments      PHQ2/9: Depression screen [facility] 2/9 03/07/2021 10/27/2020 07/18/2020 05/19/2020 04/13/2020  Decreased Interest 0 0 0 3 0  Down, Depressed, Hopeless 0 1 0 1 0  PHQ - 2 Score 0 1 0 4 0  Altered sleeping 3 - - 2 0  Tired, decreased energy 0 - - 1 0  Change in appetite 0 - - 0 0  Feeling bad or failure about yourself  0 - - 0 0  Trouble concentrating 0 - - 1 0  Moving slowly or fidgety/restless 0 - - 0 0  Suicidal thoughts 0 - - 0 0  PHQ-9 Score 3 - - 8 0  Difficult doing work/chores - - - - -  Some recent data might be hidden    phq 9 is negative   Fall Risk: Fall Risk  03/07/2021 10/27/2020 07/18/2020 05/19/2020 04/13/2020  Falls in the past year? 0 0 0 0 0  Number falls in past yr: 0 0 0 0 0  Injury with Fall? 0 0 0 0 0  Follow up - Falls evaluation completed - - -    Functional  Status Survey: Is the patient deaf or have difficulty hearing?: No Does the patient have difficulty seeing, even when wearing glasses/contacts?: No Does the patient have difficulty concentrating, remembering, or making decisions?: No Does the patient have difficulty walking or climbing stairs?: No Does the patient have difficulty dressing or bathing?: No Does the patient have difficulty doing errands alone such as visiting a doctor's office or shopping?: No   Assessment & Plan   1. Hypertension associated with type 2 diabetes mellitus (HCC)  - POCT HgB A1C - dapagliflozin propanediol (FARXIGA) 10 MG TABS tablet; Take 1 tablet (10 mg total) by mouth daily before breakfast.  Dispense: 90 tablet; Refill: 1 - CBC with Differential/Platelet - Comprehensive metabolic panel - Hemoglobin A1c - Microalbumin / creatinine urine ratio  2. Statin myopathy   3. Dyslipidemia associated with type 2 diabetes mellitus (HCC)  - dapagliflozin propanediol (FARXIGA) 10 MG TABS tablet; Take 1 tablet (10 mg total) by mouth daily before breakfast.  Dispense: 90 tablet; Refill: 1 - ezetimibe (ZETIA) 10 MG tablet; Take 1 tablet (10 mg total) by mouth daily. For cholesterol  Dispense: 90 tablet; Refill: 3 - Lipid panel  4. Essential hypertension  - amLODipine (NORVASC) 2.5 MG tablet; Take 3 tablets (7.5 mg total) by mouth daily.  Dispense: 270 tablet; Refill: 0  5. Hyperlipidemia LDL goal <70  - ezetimibe (ZETIA) 10 MG tablet; Take 1 tablet (10 mg total) by mouth daily. For cholesterol  Dispense: 90 tablet; Refill: 3  6. Insomnia, controlled   7. Low serum vitamin B12  - Vitamin B12  8. Vitamin D deficiency  - VITAMIN D 25 Hydroxy (Vit-D Deficiency, Fractures)  9. History of Crohn's disease  - Ambulatory referral to Gastroenterology

## 2021-03-07 ENCOUNTER — Ambulatory Visit: Payer: Managed Care, Other (non HMO) | Admitting: Family Medicine

## 2021-03-07 ENCOUNTER — Encounter: Payer: Self-pay | Admitting: Family Medicine

## 2021-03-07 ENCOUNTER — Other Ambulatory Visit: Payer: Self-pay

## 2021-03-07 VITALS — BP 128/80 | HR 86 | Temp 98.3°F | Resp 16 | Ht 64.0 in | Wt 154.0 lb

## 2021-03-07 DIAGNOSIS — I10 Essential (primary) hypertension: Secondary | ICD-10-CM | POA: Diagnosis not present

## 2021-03-07 DIAGNOSIS — E1169 Type 2 diabetes mellitus with other specified complication: Secondary | ICD-10-CM | POA: Diagnosis not present

## 2021-03-07 DIAGNOSIS — T466X5A Adverse effect of antihyperlipidemic and antiarteriosclerotic drugs, initial encounter: Secondary | ICD-10-CM

## 2021-03-07 DIAGNOSIS — G720 Drug-induced myopathy: Secondary | ICD-10-CM | POA: Diagnosis not present

## 2021-03-07 DIAGNOSIS — G4700 Insomnia, unspecified: Secondary | ICD-10-CM

## 2021-03-07 DIAGNOSIS — I152 Hypertension secondary to endocrine disorders: Secondary | ICD-10-CM | POA: Diagnosis not present

## 2021-03-07 DIAGNOSIS — E1159 Type 2 diabetes mellitus with other circulatory complications: Secondary | ICD-10-CM | POA: Diagnosis not present

## 2021-03-07 DIAGNOSIS — Z8719 Personal history of other diseases of the digestive system: Secondary | ICD-10-CM

## 2021-03-07 DIAGNOSIS — Z23 Encounter for immunization: Secondary | ICD-10-CM

## 2021-03-07 DIAGNOSIS — E559 Vitamin D deficiency, unspecified: Secondary | ICD-10-CM

## 2021-03-07 DIAGNOSIS — E785 Hyperlipidemia, unspecified: Secondary | ICD-10-CM

## 2021-03-07 DIAGNOSIS — E538 Deficiency of other specified B group vitamins: Secondary | ICD-10-CM

## 2021-03-07 LAB — POCT GLYCOSYLATED HEMOGLOBIN (HGB A1C): Hemoglobin A1C: 7.6 % — AB (ref 4.0–5.6)

## 2021-03-07 MED ORDER — EZETIMIBE 10 MG PO TABS: 10.0000 mg | ORAL_TABLET | Freq: Every day | ORAL | 3 refills | Status: DC

## 2021-03-07 MED ORDER — AMLODIPINE BESYLATE 2.5 MG PO TABS: 7.5000 mg | ORAL_TABLET | Freq: Every day | ORAL | 0 refills | Status: DC

## 2021-03-07 MED ORDER — DAPAGLIFLOZIN PROPANEDIOL 10 MG PO TABS: 10.0000 mg | ORAL_TABLET | Freq: Every day | ORAL | 1 refills | Status: DC

## 2021-03-07 NOTE — Addendum Note (Signed)
Addended by: Royal Hawthorn on: 03/07/2021 04:01 PM   Modules accepted: Orders

## 2021-03-12 ENCOUNTER — Encounter: Payer: Self-pay | Admitting: *Deleted

## 2021-04-18 ENCOUNTER — Other Ambulatory Visit: Payer: Self-pay

## 2021-04-18 MED ORDER — FLUCONAZOLE 150 MG PO TABS: 150.0000 mg | ORAL_TABLET | ORAL | 0 refills | Status: DC

## 2021-04-18 NOTE — Telephone Encounter (Signed)
PT states just recently started on farxiga and she does remembers you saying something about it may cause yeast infection.  She now has one a lot of itching and irritation and she is not sexually active.  Can you prescribe something or does she need to go to urgent care?

## 2021-05-07 ENCOUNTER — Other Ambulatory Visit: Payer: Self-pay

## 2021-05-09 ENCOUNTER — Other Ambulatory Visit: Payer: Self-pay

## 2021-05-09 ENCOUNTER — Ambulatory Visit: Payer: Managed Care, Other (non HMO) | Admitting: Gastroenterology

## 2021-05-09 ENCOUNTER — Encounter: Payer: Self-pay | Admitting: Gastroenterology

## 2021-05-09 VITALS — BP 134/88 | HR 77 | Temp 97.9°F | Ht 64.0 in | Wt 150.5 lb

## 2021-05-09 DIAGNOSIS — R1031 Right lower quadrant pain: Secondary | ICD-10-CM

## 2021-05-09 DIAGNOSIS — R195 Other fecal abnormalities: Secondary | ICD-10-CM | POA: Diagnosis not present

## 2021-05-09 DIAGNOSIS — Z8719 Personal history of other diseases of the digestive system: Secondary | ICD-10-CM

## 2021-05-09 DIAGNOSIS — G8929 Other chronic pain: Secondary | ICD-10-CM

## 2021-05-09 DIAGNOSIS — R140 Abdominal distension (gaseous): Secondary | ICD-10-CM

## 2021-05-09 MED ORDER — NA SULFATE-K SULFATE-MG SULF 17.5-3.13-1.6 GM/177ML PO SOLN: 354.0000 mL | Freq: Once | ORAL | 0 refills | Status: AC

## 2021-05-09 NOTE — Progress Notes (Signed)
Cephas Darby, MD [address]  [place]  [place], [place] [address]  Main: [phone]  Fax: [phone]    Gastroenterology Consultation  Referring Provider:     Steele Sizer, MD Primary Care Physician:  Steele Sizer, MD Primary Gastroenterologist:  Dr. Cephas Darby Reason for Consultation:     ?  Crohn's disease        HPI:   Samantha Stephens is a 60 y.o. female referred by Dr. Steele Sizer, MD  for consultation & management of possible Crohn's disease.  Patient reports that she was diagnosed with Crohn's disease right after college when she has significant weight loss, abdominal pain associated with severe diarrhea.  She was diagnosed after the colonoscopy.  Patient reports that she was intermittently on prednisone at the time of initial diagnosis.  She is not on any maintenance therapy to date.  She reports symptoms of right lower quadrant pain associated with urgency and loose stools, on several occasions triggered by stress.  She has been followed by Dr. Vira Agar, gastroenterologist at [facility] until he retired.  She had a flareup last year, was seen by the PA at [facility], was treated with Bentyl.  Her labs including CRP, ESR, iron panel, CBC and fecal calprotectin levels were unremarkable.  Patient lives by herself after her husband passed away about 3 years ago.  She works from home to [employer] in the [employer].  She does have diabetes, her most recent hemoglobin A1c is 7.6.  She is very active, walks every day.  Patient does not smoke or drink alcohol  NSAIDs: None  Antiplts/Anticoagulants/Anti thrombotics: None  GI Procedures:  Colonoscopy 06/17/2017 by Dr. Vira Agar Excellent prep, internal hemorrhoids were found.  Exam was otherwise normal.  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 disorder    Palpitations    Scoliosis 03/08/2016   Sleep apnea    Stress headaches     Past Surgical History:  Procedure Laterality Date   ABDOMINAL HYSTERECTOMY     CESAREAN SECTION     1   COLONOSCOPY     CYSTOSCOPY W/ RETROGRADES Bilateral 11/25/2016   Procedure: CYSTOSCOPY WITH RETROGRADE PYELOGRAM;  Surgeon: Hollice Espy, MD;  Location: [facility] ORS;  Service: Urology;  Laterality: Bilateral;   EYE SURGERY     lasix eye surgery    Current Outpatient Medications:    amLODipine (NORVASC) 2.5 MG tablet, Take 3 tablets (7.5 mg total) by mouth daily., Disp: 270 tablet, Rfl: 0   Cholecalciferol (VITAMIN D3) 2000 units TABS, Take 1 capsule by mouth daily., Disp: , Rfl:    dapagliflozin propanediol (FARXIGA) 10 MG TABS tablet, Take 1 tablet (10 mg total) by mouth daily before breakfast., Disp: 90 tablet, Rfl: 1   ezetimibe (ZETIA) 10 MG tablet, Take 1 tablet (10 mg total) by mouth daily. For cholesterol, Disp: 90 tablet, Rfl: 3   Melatonin CR 3 MG TBCR, Take by mouth., Disp: , Rfl:    Multiple Vitamin (MULTIVITAMIN) tablet, Take 1 tablet by mouth daily., Disp: , Rfl:    Na Sulfate-K Sulfate-Mg Sulf 17.5-3.13-1.6 GM/177ML SOLN, Take 354 mLs by mouth  once for 1 dose., Disp: 354 mL, Rfl: 0   VASCEPA 1 g capsule, Take 2 capsules (2 g total) by mouth 2 (two) times daily., Disp: 360 capsule, Rfl: 1    Family History  Problem Relation Age of Onset   Arthritis Mother    COPD Mother    Depression Mother    Diabetes Mother    Hyperlipidemia Mother    Hypertension Mother    Cataracts Mother    Heart disease Mother    Hearing loss Father    Kidney disease Father    Cataracts Father    Cancer - Prostate Father        kidney and prostate cancer, colon cancer   Asthma Sister    Cancer Paternal Grandmother    Mental illness Sister     Breast cancer Other      Social History   Tobacco Use   Smoking status: Former   Smokeless tobacco: Never   Tobacco comments:    but only for a short amount of time  Vaping Use   Vaping Use: Never used  Substance Use Topics   Alcohol use: Yes    Alcohol/week: 0.0 - 1.0 standard drinks    Comment: occasional glass of wine   Drug use: No    Allergies as of 05/09/2021 - Review Complete 05/09/2021  Allergen Reaction Noted   Trazodone and nefazodone Other (See Comments) 11/22/2016   Benicar [olmesartan] Cough 02/02/2018   Statins  04/19/2019    Review of Systems:    All systems reviewed and negative except where noted in HPI.   Physical Exam:  BP 134/88 (BP Location: Left Arm, Patient Position: Sitting, Cuff Size: Normal)   Pulse 77   Temp 97.9 F (36.6 C) (Oral)   Ht 5' 4"  (1.626 m)   Wt 150 lb 8 oz (68.3 kg)   BMI 25.83 kg/m  No LMP recorded. Patient has had a hysterectomy.  General:   Alert,  Well-developed, well-nourished, pleasant and cooperative in NAD Head:  Normocephalic and atraumatic. Eyes:  Sclera clear, no icterus.   Conjunctiva pink. Ears:  Normal auditory acuity. Nose:  No deformity, discharge, or lesions. Mouth:  No deformity or lesions,oropharynx pink & moist. Neck:  Supple; no masses or thyromegaly. Lungs:  Respirations even and unlabored.  Clear throughout to auscultation.   No wheezes, crackles, or rhonchi. No acute distress. Heart:  Regular rate and rhythm; no murmurs, clicks, rubs, or gallops. Abdomen:  Normal bowel sounds. Soft, non-tender and non-distended without masses, hepatosplenomegaly or hernias noted.  No guarding or rebound tenderness.   Rectal: Not performed Msk:  Symmetrical without gross deformities. Good, equal movement & strength bilaterally. Pulses:  Normal pulses noted. Extremities:  No clubbing or edema.  No cyanosis. Neurologic:  Alert and oriented x3;  grossly normal neurologically. Skin:  Intact without significant lesions or  rashes. No jaundice. Psych:  Alert and cooperative. Normal mood and affect.  Imaging Studies: No recent abdominal imaging  Assessment and Plan:   Samantha Stephens is a 15 y.o. pleasant African-American female with diabetes, possible Crohn's disease is seen in consultation for recurrent symptoms of right lower quadrant discomfort associated with abdominal bloating, gas and loose stools and sometimes associated with rectal bleeding.  No evidence of anemia including iron deficiency or B12 deficiency.  Recommend colonoscopy with TI evaluation and biopsies If colonoscopy is unremarkable, will pursue further work-up including pancreatic fecal elastase levels, H. pylori breath test Also, discussed about stress induced diarrhea predominant  IBS or lactose intolerance   Follow up in 3 months   Cephas Darby, MD

## 2021-05-14 ENCOUNTER — Other Ambulatory Visit: Payer: Self-pay | Admitting: Family Medicine

## 2021-05-14 DIAGNOSIS — E785 Hyperlipidemia, unspecified: Secondary | ICD-10-CM

## 2021-05-14 DIAGNOSIS — E1169 Type 2 diabetes mellitus with other specified complication: Secondary | ICD-10-CM

## 2021-05-21 NOTE — Progress Notes (Signed)
Name: Samantha Stephens   MRN: [id number]    DOB: February 16, 1961   Date:05/22/2021       Progress Note  Subjective  Chief Complaint  Annual Exam  HPI  Patient presents for annual CPE.  Diet: continue balanced diet  Exercise:  continue regular physical activity   [place] Office Visit from 10/27/2020 in [facility]  AUDIT-C Score 0      Depression: Phq 9 is  negative Depression screen [facility] 2/9 05/22/2021 03/07/2021 10/27/2020 07/18/2020 05/19/2020  Decreased Interest 0 0 0 0 3  Down, Depressed, Hopeless 0 0 1 0 1  PHQ - 2 Score 0 0 1 0 4  Altered sleeping - 3 - - 2  Tired, decreased energy - 0 - - 1  Change in appetite - 0 - - 0  Feeling bad or failure about yourself  - 0 - - 0  Trouble concentrating - 0 - - 1  Moving slowly or fidgety/restless - 0 - - 0  Suicidal thoughts - 0 - - 0  PHQ-9 Score - 3 - - 8  Difficult doing work/chores - - - - -  Some recent data might be hidden   Hypertension: BP Readings from Last 3 Encounters:  05/22/21 110/64  05/09/21 134/88  03/07/21 128/80   Obesity: Wt Readings from Last 3 Encounters:  05/22/21 148 lb (67.1 kg)  05/09/21 150 lb 8 oz (68.3 kg)  03/07/21 154 lb (69.9 kg)   BMI Readings from Last 3 Encounters:  05/22/21 25.40 kg/m  05/09/21 25.83 kg/m  03/07/21 26.43 kg/m     Vaccines:   Shingrix: 2nd dose  Pneumonia: educated and discussed with patient. Flu: educated and discussed with patient.  Hep C Screening: 05/25/20 STD testing and prevention (HIV/chl/gon/syphilis): 05/25/20 Intimate partner violence:negative Sexual History : not sexually active  Menstrual History/LMP/Abnormal Bleeding: discussed post menopausal bleeding  Incontinence Symptoms: no problems  Breast cancer:  - Last Mammogram: 05/03/20, advised her to schedule it for this year  - BRCA gene screening: N/A  Osteoporosis: Discussed high calcium and vitamin D supplementation, weight bearing exercises  Cervical cancer screening: N/A -  history of hysterectomy   Skin cancer: Discussed monitoring for atypical lesions  Colorectal cancer: 06/17/17  , she will have a repeat this year  Lung cancer:  Low Dose CT Chest recommended if Age 66-80 years, 20 pack-year currently smoking OR have quit w/in 15years. Patient does not qualify.   ECG: 02/22/19  Advanced Care Planning: A voluntary discussion about advance care planning including the explanation and discussion of advance directives.  Discussed health care proxy and Living will, and the patient was able to identify a health care proxy as daughter.  Patient does not have a living will at present time.   Lipids: Lab Results  Component Value Date   CHOL 238 (H) 05/25/2020   CHOL 214 (H) 10/13/2019   CHOL 164 12/31/2018   Lab Results  Component Value Date   HDL 52 05/25/2020   HDL 66 10/13/2019   HDL 55 12/31/2018   Lab Results  Component Value Date   LDLCALC 118 (H) 05/25/2020   LDLCALC 124 (H) 10/13/2019   LDLCALC 82 12/31/2018   Lab Results  Component Value Date   TRIG 393 (H) 05/25/2020   TRIG 135 10/13/2019   TRIG 134 12/31/2018   Lab Results  Component Value Date   CHOLHDL 4.6 (H) 05/25/2020   CHOLHDL 3.2 10/13/2019   CHOLHDL 3.0 12/31/2018   No  results found for: LDLDIRECT  Glucose: Glucose  Date Value Ref Range Status  05/25/2020 108 (H) 65 - 99 mg/dL Final  10/13/2019 122 (H) 65 - 99 mg/dL Final  12/31/2018 102 (H) 65 - 99 mg/dL Final  10/20/2012 118 (H) 65 - 99 mg/dL Final   Glucose, Bld  Date Value Ref Range Status  03/19/2019 77 65 - 99 mg/dL Final    Comment:    .            Fasting reference interval .   02/20/2019 136 (H) 70 - 99 mg/dL Final  12/10/2018 119 (H) 70 - 99 mg/dL Final   Glucose-Capillary  Date Value Ref Range Status  11/21/2016 77 65 - 99 mg/dL Final    Patient Active Problem List   Diagnosis Date Noted   CPAP use counseling 08/14/2020   Insomnia related to another mental disorder    History of Crohn's disease  05/30/2020   Vitamin D deficiency 01/07/2020   Vitamin B12 deficiency 01/07/2020   Osteoarthritis of hip 11/12/2019   DDD (degenerative disc disease), cervical 04/19/2019   Hyperlipidemia LDL goal <70 12/16/2018   Diabetes mellitus ([place]) 04/22/2018   OSA on CPAP 09/15/2017   Chronic hip pain, right 09/09/2016   Numbness of right foot 09/09/2016   Scoliosis 03/08/2016   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   Situational anxiety 10/27/2015   Hypertension goal BP (blood pressure) < 130/80 08/18/2015   Insomnia, controlled 08/18/2015    Past Surgical History:  Procedure Laterality Date   ABDOMINAL HYSTERECTOMY     CESAREAN SECTION     1   COLONOSCOPY     CYSTOSCOPY W/ RETROGRADES Bilateral 11/25/2016   Procedure: CYSTOSCOPY WITH RETROGRADE PYELOGRAM;  Surgeon: Hollice Espy, MD;  Location: [facility] ORS;  Service: Urology;  Laterality: Bilateral;   EYE SURGERY     lasix eye surgery    Family History  Problem Relation Age of Onset   Arthritis Mother    COPD Mother    Depression Mother    Diabetes Mother    Hyperlipidemia Mother    Hypertension Mother    Cataracts Mother    Heart disease Mother    Hearing loss Father    Kidney disease Father    Cataracts Father    Cancer - Prostate Father        kidney and prostate cancer, colon cancer   Asthma Sister    Cancer Paternal Grandmother    Mental illness Sister    Breast cancer Other     Social History   Socioeconomic History   Marital status: Widowed    Spouse name: Not on file   Number of children: 1   Years of education: Not on file   Highest education level: Bachelor's degree (e.g., BA, AB, BS)  Occupational History   Occupation: IT   Tobacco Use   Smoking status: Former   Smokeless tobacco: Never   Tobacco comments:    but only for a short amount of time  Vaping Use   Vaping Use: Never used  Substance and Sexual Activity   Alcohol use: Yes    Alcohol/week: 0.0 - 1.0 standard drinks    Comment:  occasional glass of wine   Drug use: No   Sexual activity: Yes    Partners: Male  Other Topics Concern   Not on file  Social History Narrative   Not on file   Social Determinants of Health   Financial Resource Strain: Low Risk  Difficulty of Paying Living Expenses: Not hard at all  Food Insecurity: No Food Insecurity   Worried About [place] in the Last Year: Never true   Ran Out of Food in the Last Year: Never true  Transportation Needs: No Transportation Needs   Lack of Transportation (Medical): No   Lack of Transportation (Non-Medical): No  Physical Activity: Sufficiently Active   Days of Exercise per Week: 5 days   Minutes of Exercise per Session: 60 min  Stress: No Stress Concern Present   Feeling of Stress : Only a little  Social Connections: Moderately Integrated   Frequency of Communication with Friends and Family: More than three times a week   Frequency of Social Gatherings with Friends and Family: Once a week   Attends Religious Services: More than 4 times per year   Active Member of [employer] or Organizations: Yes   Attends Archivist Meetings: More than 4 times per year   Marital Status: Widowed  Human resources officer Violence: Not At Risk   Fear of Current or Ex-Partner: No   Emotionally Abused: No   Physically Abused: No   Sexually Abused: No     Current Outpatient Medications:    amLODipine (NORVASC) 2.5 MG tablet, Take 3 tablets (7.5 mg total) by mouth daily., Disp: 270 tablet, Rfl: 0   Cholecalciferol (VITAMIN D3) 2000 units TABS, Take 1 capsule by mouth daily., Disp: , Rfl:    dapagliflozin propanediol (FARXIGA) 10 MG TABS tablet, Take 1 tablet (10 mg total) by mouth daily before breakfast., Disp: 90 tablet, Rfl: 1   ezetimibe (ZETIA) 10 MG tablet, TAKE 1 TABLET(10 MG) BY MOUTH DAILY FOR CHOLESTEROL, Disp: 90 tablet, Rfl: 0   Multiple Vitamin (MULTIVITAMIN) tablet, Take 1 tablet by mouth daily., Disp: , Rfl:    VASCEPA 1 g capsule, Take 2  capsules (2 g total) by mouth 2 (two) times daily., Disp: 360 capsule, Rfl: 1  Allergies  Allergen Reactions   Trazodone And Nefazodone Other (See Comments)    Syncope; see in ER Feb 2018   Benicar [Olmesartan] Cough   Statins      ROS  Constitutional: Negative for fever or weight change.  Respiratory: Negative for cough and shortness of breath.   Cardiovascular: Negative for chest pain or palpitations.  Gastrointestinal: Negative for abdominal pain, no bowel changes.  Musculoskeletal: Negative for gait problem or joint swelling.  Skin: Negative for rash.  Neurological: Negative for dizziness or headache.  No other specific complaints in a complete review of systems (except as listed in HPI above).   Objective  Vitals:   05/22/21 1519  BP: 110/64  Pulse: 92  Resp: 16  Temp: 98.1 F (36.7 C)  SpO2: 99%  Weight: 148 lb (67.1 kg)  Height: 5' 4"  (1.626 m)    Body mass index is 25.4 kg/m.  Physical Exam  Constitutional: Patient appears well-developed and well-nourished. No distress.  HENT: Head: Normocephalic and atraumatic. Ears: B TMs ok, no erythema or effusion; Nose: Nose normal. Mouth/Throat: not done  Eyes: Conjunctivae and EOM are normal. Pupils are equal, round, and reactive to light. No scleral icterus.  Neck: Normal range of motion. Neck supple. No JVD present. No thyromegaly present.  Cardiovascular: Normal rate, regular rhythm and normal heart sounds.  No murmur heard. No BLE edema. Pulmonary/Chest: Effort normal and breath sounds normal. No respiratory distress. Abdominal: Soft. Bowel sounds are normal, no distension. There is no tenderness. no masses Breast: no lumps or masses,  no nipple discharge or rashes FEMALE GENITALIA:  Not done  RECTAL: not done  Musculoskeletal: Normal range of motion, no joint effusions. No gross deformities Neurological: he is alert and oriented to person, place, and time. No cranial nerve deficit. Coordination, balance,  strength, speech and gait are normal.  Skin: Skin is warm and dry. No rash noted. No erythema.  Psychiatric: Patient has a normal mood and affect. behavior is normal. Judgment and thought content normal.   Recent Results (from the past 2160 hour(s))  POCT HgB A1C     Status: Abnormal   Collection Time: 03/07/21  3:12 PM  Result Value Ref Range   Hemoglobin A1C 7.6 (A) 4.0 - 5.6 %   HbA1c POC (<> result, manual entry)     HbA1c, POC (prediabetic range)     HbA1c, POC (controlled diabetic range)        Fall Risk: Fall Risk  05/22/2021 03/07/2021 10/27/2020 07/18/2020 05/19/2020  Falls in the past year? 0 0 0 0 0  Number falls in past yr: 0 0 0 0 0  Injury with Fall? 0 0 0 0 0  Follow up - - Falls evaluation completed - -     Functional Status Survey: Is the patient deaf or have difficulty hearing?: No Does the patient have difficulty seeing, even when wearing glasses/contacts?: No Does the patient have difficulty concentrating, remembering, or making decisions?: No Does the patient have difficulty walking or climbing stairs?: No Does the patient have difficulty dressing or bathing?: No Does the patient have difficulty doing errands alone such as visiting a doctor's office or shopping?: No   Assessment & Plan  1. Well adult exam   2. Need for shingles vaccine  - Varicella-zoster vaccine IM  3. Breast cancer screening by mammogram  - MM 3D SCREEN BREAST BILATERAL; Future   -USPSTF grade A and B recommendations reviewed with patient; age-appropriate recommendations, preventive care, screening tests, etc discussed and encouraged; healthy living encouraged; see AVS for patient education given to patient -Discussed importance of 150 minutes of physical activity weekly, eat two servings of fish weekly, eat one serving of tree nuts ( cashews, pistachios, pecans, almonds.Marland Kitchen) every other day, eat 6 servings of fruit/vegetables daily and drink plenty of water and avoid sweet beverages.

## 2021-05-21 NOTE — Patient Instructions (Signed)
Preventive Care 61-61 Years Old, Female Preventive care refers to lifestyle choices and visits with your health care provider that can promote health and wellness. This includes: A yearly physical exam. This is also called an annual wellness visit. Regular dental and eye exams. Immunizations. Screening for certain conditions. Healthy lifestyle choices, such as: Eating a healthy diet. Getting regular exercise. Not using drugs or products that contain nicotine and tobacco. Limiting alcohol use. What can I expect for my preventive care visit? Physical exam Your health care provider will check your: Height and weight. These may be used to calculate your BMI (body mass index). BMI is a measurement that tells if you are at a healthy weight. Heart rate and blood pressure. Body temperature. Skin for abnormal spots. Counseling Your health care provider may ask you questions about your: Past medical problems. Family's medical history. Alcohol, tobacco, and drug use. Emotional well-being. Home life and relationship well-being. Sexual activity. Diet, exercise, and sleep habits. Work and work Statistician. Access to firearms. Method of birth control. Menstrual cycle. Pregnancy history. What immunizations do I need?  Vaccines are usually given at various ages, according to a schedule. Your health care provider will recommend vaccines for you based on your age, medicalhistory, and lifestyle or other factors, such as travel or where you work. What tests do I need? Blood tests Lipid and cholesterol levels. These may be checked every 5 years, or more often if you are over 50 years old. Hepatitis C test. Hepatitis B test. Screening Lung cancer screening. You may have this screening every year starting at age 65 if you have a 30-pack-year history of smoking and currently smoke or have quit within the past 15 years. Colorectal cancer screening. All adults should have this screening starting at  age 57 and continuing until age 20. Your health care provider may recommend screening at age 64 if you are at increased risk. You will have tests every 1-10 years, depending on your results and the type of screening test. Diabetes screening. This is done by checking your blood sugar (glucose) after you have not eaten for a while (fasting). You may have this done every 1-3 years. Mammogram. This may be done every 1-2 years. Talk with your health care provider about when you should start having regular mammograms. This may depend on whether you have a family history of breast cancer. BRCA-related cancer screening. This may be done if you have a family history of breast, ovarian, tubal, or peritoneal cancers. Pelvic exam and Pap test. This may be done every 3 years starting at age 30. Starting at age 47, this may be done every 5 years if you have a Pap test in combination with an HPV test. Other tests STD (sexually transmitted disease) testing, if you are at risk. Bone density scan. This is done to screen for osteoporosis. You may have this scan if you are at high risk for osteoporosis. Talk with your health care provider about your test results, treatment options,and if necessary, the need for more tests. Follow these instructions at home: Eating and drinking  Eat a diet that includes fresh fruits and vegetables, whole grains, lean protein, and low-fat dairy products. Take vitamin and mineral supplements as recommended by your health care provider. Do not drink alcohol if: Your health care provider tells you not to drink. You are pregnant, may be pregnant, or are planning to become pregnant. If you drink alcohol: Limit how much you have to 0-1 drink a day. Be aware  of how much alcohol is in your drink. In the [place] one drink equals one 12 oz bottle of beer (355 mL), one 5 oz glass of wine (148 mL), or one 1 oz glass of hard liquor (44 mL).  Lifestyle Take daily care of your teeth and  gums. Brush your teeth every morning and night with fluoride toothpaste. Floss one time each day. Stay active. Exercise for at least 30 minutes 5 or more days each week. Do not use any products that contain nicotine or tobacco, such as cigarettes, e-cigarettes, and chewing tobacco. If you need help quitting, ask your health care provider. Do not use drugs. If you are sexually active, practice safe sex. Use a condom or other form of protection to prevent STIs (sexually transmitted infections). If you do not wish to become pregnant, use a form of birth control. If you plan to become pregnant, see your health care provider for a prepregnancy visit. If told by your health care provider, take low-dose aspirin daily starting at age 71. Find healthy ways to cope with stress, such as: Meditation, yoga, or listening to music. Journaling. Talking to a trusted person. Spending time with friends and family. Safety Always wear your seat belt while driving or riding in a vehicle. Do not drive: If you have been drinking alcohol. Do not ride with someone who has been drinking. When you are tired or distracted. While texting. Wear a helmet and other protective equipment during sports activities. If you have firearms in your house, make sure you follow all gun safety procedures. What's next? Visit your health care provider once a year for an annual wellness visit. Ask your health care provider how often you should have your eyes and teeth checked. Stay up to date on all vaccines. This information is not intended to replace advice given to you by your health care provider. Make sure you discuss any questions you have with your healthcare provider. Document Revised: 07/11/2020 Document Reviewed: 06/18/2018 Elsevier Patient Education  2022 [employer].

## 2021-05-22 ENCOUNTER — Other Ambulatory Visit: Payer: Self-pay

## 2021-05-22 ENCOUNTER — Encounter: Payer: Self-pay | Admitting: Family Medicine

## 2021-05-22 ENCOUNTER — Ambulatory Visit (INDEPENDENT_AMBULATORY_CARE_PROVIDER_SITE_OTHER): Payer: Managed Care, Other (non HMO) | Admitting: Family Medicine

## 2021-05-22 VITALS — BP 110/64 | HR 92 | Temp 98.1°F | Resp 16 | Ht 64.0 in | Wt 148.0 lb

## 2021-05-22 DIAGNOSIS — Z1231 Encounter for screening mammogram for malignant neoplasm of breast: Secondary | ICD-10-CM

## 2021-05-22 DIAGNOSIS — Z0000 Encounter for general adult medical examination without abnormal findings: Secondary | ICD-10-CM | POA: Diagnosis not present

## 2021-05-22 DIAGNOSIS — Z23 Encounter for immunization: Secondary | ICD-10-CM | POA: Diagnosis not present

## 2021-06-02 LAB — COMPREHENSIVE METABOLIC PANEL
ALT: 26 IU/L (ref 0–32)
AST: 27 IU/L (ref 0–40)
Albumin/Globulin Ratio: 2.1 (ref 1.2–2.2)
Albumin: 5.2 g/dL — ABNORMAL HIGH (ref 3.8–4.9)
Alkaline Phosphatase: 100 IU/L (ref 44–121)
BUN/Creatinine Ratio: 14 (ref 12–28)
BUN: 14 mg/dL (ref 8–27)
Bilirubin Total: 0.4 mg/dL (ref 0.0–1.2)
CO2: 22 mmol/L (ref 20–29)
Calcium: 9.7 mg/dL (ref 8.7–10.3)
Chloride: 100 mmol/L (ref 96–106)
Creatinine, Ser: 1 mg/dL (ref 0.57–1.00)
Globulin, Total: 2.5 g/dL (ref 1.5–4.5)
Glucose: 128 mg/dL — ABNORMAL HIGH (ref 65–99)
Potassium: 4.4 mmol/L (ref 3.5–5.2)
Sodium: 140 mmol/L (ref 134–144)
Total Protein: 7.7 g/dL (ref 6.0–8.5)
eGFR: 64 mL/min/{1.73_m2} (ref >59)

## 2021-06-02 LAB — HEMOGLOBIN A1C
Est. average glucose Bld gHb Est-mCnc: 151 mg/dL
Hgb A1c MFr Bld: 6.9 % — ABNORMAL HIGH (ref 4.8–5.6)

## 2021-06-02 LAB — LIPID PANEL
Chol/HDL Ratio: 2.9 ratio (ref 0.0–4.4)
Cholesterol, Total: 195 mg/dL (ref 100–199)
HDL: 68 mg/dL (ref >39)
LDL Chol Calc (NIH): 110 mg/dL — ABNORMAL HIGH (ref 0–99)
Triglycerides: 94 mg/dL (ref 0–149)
VLDL Cholesterol Cal: 17 mg/dL (ref 5–40)

## 2021-06-02 LAB — MICROALBUMIN / CREATININE URINE RATIO
Creatinine, Urine: 161.9 mg/dL
Microalb/Creat Ratio: 9 mg/g creat (ref 0–29)
Microalbumin, Urine: 15 ug/mL

## 2021-06-02 LAB — CBC WITH DIFFERENTIAL/PLATELET
Basophils Absolute: 0.1 10*3/uL (ref 0.0–0.2)
Basos: 1 %
EOS (ABSOLUTE): 0.2 10*3/uL (ref 0.0–0.4)
Eos: 3 %
Hematocrit: 43.5 % (ref 34.0–46.6)
Hemoglobin: 14.5 g/dL (ref 11.1–15.9)
Immature Grans (Abs): 0 10*3/uL (ref 0.0–0.1)
Immature Granulocytes: 0 %
Lymphocytes Absolute: 2.6 10*3/uL (ref 0.7–3.1)
Lymphs: 37 %
MCH: 25.2 pg — ABNORMAL LOW (ref 26.6–33.0)
MCHC: 33.3 g/dL (ref 31.5–35.7)
MCV: 76 fL — ABNORMAL LOW (ref 79–97)
Monocytes Absolute: 0.4 10*3/uL (ref 0.1–0.9)
Monocytes: 5 %
Neutrophils Absolute: 3.7 10*3/uL (ref 1.4–7.0)
Neutrophils: 54 %
Platelets: 326 10*3/uL (ref 150–450)
RBC: 5.75 x10E6/uL — ABNORMAL HIGH (ref 3.77–5.28)
RDW: 14.6 % (ref 11.7–15.4)
WBC: 6.9 10*3/uL (ref 3.4–10.8)

## 2021-06-02 LAB — VITAMIN D 25 HYDROXY (VIT D DEFICIENCY, FRACTURES): Vit D, 25-Hydroxy: 49.8 ng/mL (ref 30.0–100.0)

## 2021-06-02 LAB — VITAMIN B12: Vitamin B-12: 1923 pg/mL — ABNORMAL HIGH (ref 232–1245)

## 2021-06-03 ENCOUNTER — Other Ambulatory Visit: Payer: Self-pay | Admitting: Family Medicine

## 2021-06-03 DIAGNOSIS — R718 Other abnormality of red blood cells: Secondary | ICD-10-CM

## 2021-06-04 ENCOUNTER — Other Ambulatory Visit: Payer: Self-pay

## 2021-06-04 ENCOUNTER — Other Ambulatory Visit: Payer: Self-pay | Admitting: Family Medicine

## 2021-06-04 DIAGNOSIS — I10 Essential (primary) hypertension: Secondary | ICD-10-CM

## 2021-06-08 ENCOUNTER — Encounter: Payer: Self-pay | Admitting: Family Medicine

## 2021-06-08 LAB — SPECIMEN STATUS REPORT

## 2021-06-08 LAB — IRON AND TIBC
Iron Saturation: 27 % (ref 15–55)
Iron: 78 ug/dL (ref 27–159)
Total Iron Binding Capacity: 292 ug/dL (ref 250–450)
UIBC: 214 ug/dL (ref 131–425)

## 2021-06-11 ENCOUNTER — Other Ambulatory Visit: Payer: Self-pay

## 2021-06-11 MED ORDER — CLENPIQ 10-3.5-12 MG-GM -GM/160ML PO SOLN: 320.0000 mL | Freq: Once | ORAL | 0 refills | Status: AC

## 2021-06-11 NOTE — Progress Notes (Signed)
Patient lvm, her bowel prep is to expensive and please call in another one. Change patient prep to Clenpiq. Left a message for call back sent mychart message

## 2021-06-13 ENCOUNTER — Other Ambulatory Visit: Payer: Self-pay | Admitting: Family Medicine

## 2021-06-13 DIAGNOSIS — E1169 Type 2 diabetes mellitus with other specified complication: Secondary | ICD-10-CM

## 2021-06-13 DIAGNOSIS — E785 Hyperlipidemia, unspecified: Secondary | ICD-10-CM

## 2021-06-13 DIAGNOSIS — I152 Hypertension secondary to endocrine disorders: Secondary | ICD-10-CM

## 2021-06-13 DIAGNOSIS — E1159 Type 2 diabetes mellitus with other circulatory complications: Secondary | ICD-10-CM

## 2021-06-15 ENCOUNTER — Encounter: Payer: Self-pay | Admitting: Gastroenterology

## 2021-06-18 ENCOUNTER — Ambulatory Visit: Payer: Managed Care, Other (non HMO) | Admitting: Anesthesiology

## 2021-06-18 ENCOUNTER — Encounter: Payer: Self-pay | Admitting: Gastroenterology

## 2021-06-18 ENCOUNTER — Ambulatory Visit
Admission: RE | Admit: 2021-06-18 | Discharge: 2021-06-18 | Disposition: A | Discharge status: Home / Self Care | Payer: Managed Care, Other (non HMO) | Attending: Gastroenterology | Admitting: Gastroenterology

## 2021-06-18 ENCOUNTER — Other Ambulatory Visit: Payer: Self-pay

## 2021-06-18 ENCOUNTER — Encounter
Admission: RE | Disposition: A | Discharge status: Home / Self Care | Payer: Self-pay | Source: Home / Self Care | Attending: Gastroenterology

## 2021-06-18 DIAGNOSIS — Z7984 Long term (current) use of oral hypoglycemic drugs: Secondary | ICD-10-CM | POA: Diagnosis not present

## 2021-06-18 DIAGNOSIS — Z803 Family history of malignant neoplasm of breast: Secondary | ICD-10-CM | POA: Diagnosis not present

## 2021-06-18 DIAGNOSIS — Z833 Family history of diabetes mellitus: Secondary | ICD-10-CM | POA: Insufficient documentation

## 2021-06-18 DIAGNOSIS — Z8051 Family history of malignant neoplasm of kidney: Secondary | ICD-10-CM | POA: Diagnosis not present

## 2021-06-18 DIAGNOSIS — K5190 Ulcerative colitis, unspecified, without complications: Secondary | ICD-10-CM | POA: Diagnosis not present

## 2021-06-18 DIAGNOSIS — Z8042 Family history of malignant neoplasm of prostate: Secondary | ICD-10-CM | POA: Insufficient documentation

## 2021-06-18 DIAGNOSIS — Z8489 Family history of other specified conditions: Secondary | ICD-10-CM | POA: Diagnosis not present

## 2021-06-18 DIAGNOSIS — Z8261 Family history of arthritis: Secondary | ICD-10-CM | POA: Insufficient documentation

## 2021-06-18 DIAGNOSIS — Z8249 Family history of ischemic heart disease and other diseases of the circulatory system: Secondary | ICD-10-CM | POA: Insufficient documentation

## 2021-06-18 DIAGNOSIS — E119 Type 2 diabetes mellitus without complications: Secondary | ICD-10-CM | POA: Diagnosis not present

## 2021-06-18 DIAGNOSIS — Z87891 Personal history of nicotine dependence: Secondary | ICD-10-CM | POA: Insufficient documentation

## 2021-06-18 DIAGNOSIS — Z79899 Other long term (current) drug therapy: Secondary | ICD-10-CM | POA: Diagnosis not present

## 2021-06-18 DIAGNOSIS — K5150 Left sided colitis without complications: Secondary | ICD-10-CM | POA: Insufficient documentation

## 2021-06-18 DIAGNOSIS — Z825 Family history of asthma and other chronic lower respiratory diseases: Secondary | ICD-10-CM | POA: Insufficient documentation

## 2021-06-18 DIAGNOSIS — Z8349 Family history of other endocrine, nutritional and metabolic diseases: Secondary | ICD-10-CM | POA: Diagnosis not present

## 2021-06-18 DIAGNOSIS — Z8719 Personal history of other diseases of the digestive system: Secondary | ICD-10-CM | POA: Insufficient documentation

## 2021-06-18 DIAGNOSIS — K529 Noninfective gastroenteritis and colitis, unspecified: Secondary | ICD-10-CM

## 2021-06-18 DIAGNOSIS — Z888 Allergy status to other drugs, medicaments and biological substances status: Secondary | ICD-10-CM | POA: Insufficient documentation

## 2021-06-18 DIAGNOSIS — Z841 Family history of disorders of kidney and ureter: Secondary | ICD-10-CM | POA: Diagnosis not present

## 2021-06-18 HISTORY — PX: COLONOSCOPY WITH PROPOFOL: SHX5780

## 2021-06-18 HISTORY — DX: Type 2 diabetes mellitus without complications: E11.9

## 2021-06-18 LAB — GLUCOSE, CAPILLARY: Glucose-Capillary: 129 mg/dL — ABNORMAL HIGH (ref 70–99)

## 2021-06-18 SURGERY — COLONOSCOPY WITH PROPOFOL
Anesthesia: General

## 2021-06-18 MED ORDER — PROPOFOL 500 MG/50ML IV EMUL
INTRAVENOUS | Status: DC | PRN
  Administered 2021-06-18: 120 ug/kg/min via INTRAVENOUS

## 2021-06-18 MED ORDER — PROPOFOL 500 MG/50ML IV EMUL
INTRAVENOUS | Status: AC
  Filled 2021-06-18: qty 50

## 2021-06-18 MED ORDER — SODIUM CHLORIDE 0.9 % IV SOLN: INTRAVENOUS | Status: DC

## 2021-06-18 NOTE — Anesthesia Procedure Notes (Signed)
Date/Time: 06/18/2021 8:23 AM Performed by: Vaughan Sine Pre-anesthesia Checklist: Patient identified, Emergency Drugs available, Suction available, Patient being monitored and Timeout performed Patient Re-evaluated:Patient Re-evaluated prior to induction Oxygen Delivery Method: Nasal cannula Preoxygenation: Pre-oxygenation with 100% oxygen Induction Type: IV induction Placement Confirmation: positive ETCO2 and CO2 detector

## 2021-06-18 NOTE — Transfer of Care (Signed)
Immediate Anesthesia Transfer of Care Note  Patient: Samantha Stephens  Procedure(s) Performed: COLONOSCOPY WITH PROPOFOL  Patient Location: PACU  Anesthesia Type:General  Level of Consciousness: awake and sedated  Airway & Oxygen Therapy: Patient Spontanous Breathing and Patient connected to nasal cannula oxygen  Post-op Assessment: Report given to RN  Post vital signs: Reviewed and stable  Last Vitals:  Vitals Value Taken Time  BP    Temp    Pulse    Resp    SpO2      Last Pain:  Vitals:   06/18/21 0740  TempSrc: Temporal  PainSc: 0-No pain         Complications: No notable events documented.

## 2021-06-18 NOTE — Anesthesia Preprocedure Evaluation (Signed)
Anesthesia Evaluation  Patient identified by MRN, date of birth, ID band Patient awake    Reviewed: Allergy & Precautions, H&P , NPO status , Patient's Chart, lab work & pertinent test results, reviewed documented beta blocker date and time   Airway Mallampati: II   Neck ROM: full    Dental  (+) Teeth Intact   Pulmonary sleep apnea and Continuous Positive Airway Pressure Ventilation , former smoker,    Pulmonary exam normal        Cardiovascular Exercise Tolerance: Good hypertension, On Medications negative cardio ROS Normal cardiovascular exam Rhythm:regular Rate:Normal     Neuro/Psych  Headaches, PSYCHIATRIC DISORDERS Anxiety    GI/Hepatic negative GI ROS, Neg liver ROS,   Endo/Other  negative endocrine ROSdiabetes, Type 2, Oral Hypoglycemic Agents  Renal/GU negative Renal ROS  negative genitourinary   Musculoskeletal   Abdominal   Peds  Hematology negative hematology ROS (+)   Anesthesia Other Findings Past Medical History: No date: Arthritis 08/18/2015: Chronic mid back pain 03/08/2016: Degenerative arthritis of lumbar spine No date: Diabetes mellitus without complication ([place]) 40/05/6760: Family hx of colon cancer requiring screening colonoscopy No date: Headache No date: Hyperlipidemia No date: Hypertension No date: Hypokalemia No date: Insomnia related to another mental disorder No date: Palpitations 03/08/2016: Scoliosis No date: Sleep apnea No date: Stress headaches Past Surgical History: No date: ABDOMINAL HYSTERECTOMY No date: CESAREAN SECTION     Comment:  1 No date: COLONOSCOPY 11/25/2016: CYSTOSCOPY W/ RETROGRADES; Bilateral     Comment:  Procedure: CYSTOSCOPY WITH RETROGRADE PYELOGRAM;                Surgeon: Hollice Espy, MD;  Location: [facility] ORS;                Service: Urology;  Laterality: Bilateral; No date: EYE SURGERY     Comment:  lasix eye surgery BMI    Body Mass Index: 24.89  kg/m     Reproductive/Obstetrics negative OB ROS                             Anesthesia Physical Anesthesia Plan  ASA: 3  Anesthesia Plan: General   Post-op Pain Management:    Induction:   PONV Risk Score and Plan:   Airway Management Planned:   Additional Equipment:   Intra-op Plan:   Post-operative Plan:   Informed Consent: I have reviewed the patients History and Physical, chart, labs and discussed the procedure including the risks, benefits and alternatives for the proposed anesthesia with the patient or authorized representative who has indicated his/her understanding and acceptance.     Dental Advisory Given  Plan Discussed with: CRNA  Anesthesia Plan Comments:         Anesthesia Quick Evaluation

## 2021-06-18 NOTE — Op Note (Signed)
[facility] Gastroenterology Patient Name: Samantha Stephens Procedure Date: 06/18/2021 8:17 AM MRN: [id number] Account #: [id number] Date of Birth: [birth year] Admit Type: Outpatient Age: 60 Room: [facility] ENDO ROOM 1 Gender: Female Note Status: Finalized Procedure:             Colonoscopy Indications:           Last colonoscopy: December 2013, Suspected Crohn's                         disease, Chronic diarrhea Providers:             Lin Landsman MD, MD Referring MD:          Bethena Roys. Sowles, MD (Referring MD) Medicines:             General Anesthesia Complications:         No immediate complications. Estimated blood loss: None. Procedure:             Pre-Anesthesia Assessment:                        - Prior to the procedure, a History and Physical was                         performed, and patient medications and allergies were                         reviewed. The patient is competent. The risks and                         benefits of the procedure and the sedation options and                         risks were discussed with the patient. All questions                         were answered and informed consent was obtained.                         Patient identification and proposed procedure were                         verified by the physician, the nurse, the                         anesthesiologist, the anesthetist and the technician                         in the pre-procedure area in the procedure room in the                         endoscopy suite. Mental Status Examination: alert and                         oriented. Airway Examination: normal oropharyngeal                         airway and neck mobility. Respiratory Examination:  clear to auscultation. CV Examination: normal.                         Prophylactic Antibiotics: The patient does not require                         prophylactic antibiotics. Prior Anticoagulants: The                          patient has taken no previous anticoagulant or                         antiplatelet agents. ASA Grade Assessment: III - A                         patient with severe systemic disease. After reviewing                         the risks and benefits, the patient was deemed in                         satisfactory condition to undergo the procedure. The                         anesthesia plan was to use general anesthesia.                         Immediately prior to administration of medications,                         the patient was re-assessed for adequacy to receive                         sedatives. The heart rate, respiratory rate, oxygen                         saturations, blood pressure, adequacy of pulmonary                         ventilation, and response to care were monitored                         throughout the procedure. The physical status of the                         patient was re-assessed after the procedure.                        After obtaining informed consent, the colonoscope was                         passed under direct vision. Throughout the procedure,                         the patient's blood pressure, pulse, and oxygen                         saturations were monitored continuously. The  Colonoscope was introduced through the anus and                         advanced to the 10 cm into the ileum. The colonoscopy                         was performed without difficulty. The patient                         tolerated the procedure well. The quality of the bowel                         preparation was evaluated using the BBPS [facility] Bowel                         Preparation Scale) with scores of: Right Colon = 3,                         Transverse Colon = 3 and Left Colon = 3 (entire mucosa                         seen well with no residual staining, small fragments                         of stool or opaque  liquid). The total BBPS score                         equals 9. Findings:      The perianal and digital rectal examinations were normal. Pertinent       negatives include normal sphincter tone and no palpable rectal lesions.      The terminal ileum appeared normal. Biopsies were taken with a cold       forceps for histology.      Normal mucosa was found in the right colon. Biopsies were taken with a       cold forceps for histology.      Diffuse mild inflammation characterized by congestion (edema), erosions       and friability was found in the sigmoid colon and in the descending       colon. Biopsies were taken with a cold forceps for histology.      The retroflexed view of the distal rectum and anal verge was normal and       showed no anal or rectal abnormalities. Impression:            - The examined portion of the ileum was normal.                         Biopsied.                        - Normal mucosa in the right colon. Biopsied.                        - Diffuse mild inflammation was found in the sigmoid                         colon and in the descending colon secondary to  left-sided colitis. Biopsied.                        - The distal rectum and anal verge are normal on                         retroflexion view. Recommendation:        - Discharge patient to home (with escort).                        - Resume previous diet today.                        - Continue present medications.                        - Await pathology results.                        - Return to my office as previously scheduled. Procedure Code(s):     --- Professional ---                        [phone], Colonoscopy, flexible; with biopsy, single or                         multiple Diagnosis Code(s):     --- Professional ---                        K52.9, Noninfective gastroenteritis and colitis,                         unspecified                        K51.50, Left sided  colitis without complications CPT copyright 2019 American Medical Association. All rights reserved. The codes documented in this report are preliminary and upon coder review may  be revised to meet current compliance requirements. Dr. Ulyess Mort Lin Landsman MD, MD 06/18/2021 8:35:59 AM This report has been signed electronically. Number of Addenda: 0 Note Initiated On: 06/18/2021 8:17 AM Scope Withdrawal Time: 0 hours 7 minutes 53 seconds  Total Procedure Duration: 0 hours 10 minutes 59 seconds  Estimated Blood Loss:  Estimated blood loss: none.      [facility]

## 2021-06-18 NOTE — H&P (Signed)
Cephas Darby, MD [address]  [place]  [place], [place] [address]  Main: [phone]  Fax: [phone] Pager: [phone]  Primary Care Physician:  Steele Sizer, MD Primary Gastroenterologist:  Dr. Cephas Darby  Pre-Procedure History & Physical: HPI:  Samantha Stephens is a 60 y.o. female is here for an colonoscopy.   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   Diabetes mellitus without complication ([place])    Family hx of colon cancer requiring screening colonoscopy 03/08/2016   Headache    Hyperlipidemia    Hypertension    Hypokalemia    Insomnia related to another mental disorder    Palpitations    Scoliosis 03/08/2016   Sleep apnea    Stress headaches     Past Surgical History:  Procedure Laterality Date   ABDOMINAL HYSTERECTOMY     CESAREAN SECTION     1   COLONOSCOPY     CYSTOSCOPY W/ RETROGRADES Bilateral 11/25/2016   Procedure: CYSTOSCOPY WITH RETROGRADE PYELOGRAM;  Surgeon: Hollice Espy, MD;  Location: [facility] ORS;  Service: Urology;  Laterality: Bilateral;   EYE SURGERY     lasix eye surgery    Prior to Admission medications   Medication Sig Start Date End Date Taking? Authorizing Provider  amLODipine (NORVASC) 2.5 MG tablet TAKE 3 TABLETS(7.5 MG) BY MOUTH DAILY 06/04/21  Yes Sowles, Drue Stager, MD  Cholecalciferol (VITAMIN D3) 2000 units TABS Take 1 capsule by mouth daily.   Yes [provider]  dapagliflozin propanediol (FARXIGA) 10 MG TABS tablet Take 1 tablet (10 mg total) by mouth daily before breakfast. 03/07/21  Yes Sowles, Drue Stager, MD  ezetimibe (ZETIA) 10 MG tablet TAKE 1 TABLET(10 MG) BY MOUTH DAILY FOR CHOLESTEROL 05/14/21  Yes Sowles, Drue Stager, MD  Multiple Vitamin (MULTIVITAMIN) tablet Take 1 tablet by mouth daily.   Yes [provider]  VASCEPA 1 g capsule Take 2 capsules (2 g total) by mouth 2 (two) times daily. 07/18/20   Yes Steele Sizer, MD    Allergies as of 05/09/2021 - Review Complete 05/09/2021  Allergen Reaction Noted   Trazodone and nefazodone Other (See Comments) 11/22/2016   Benicar [olmesartan] Cough 02/02/2018   Statins  04/19/2019    Family History  Problem Relation Age of Onset   Arthritis Mother    COPD Mother    Depression Mother    Diabetes Mother    Hyperlipidemia Mother    Hypertension Mother    Cataracts Mother    Heart disease Mother    Hearing loss Father    Kidney disease Father    Cataracts Father    Cancer - Prostate Father        kidney and prostate cancer, colon cancer   Asthma Sister    Cancer Paternal Grandmother    Mental illness Sister    Breast cancer Other     Social History   Socioeconomic History   Marital status: Widowed    Spouse name: Not on file   Number of children: 1   Years of education: Not on file   Highest education level: Bachelor's degree (e.g., BA, AB, BS)  Occupational History   Occupation: IT   Tobacco Use   Smoking status: Former    Types: Cigarettes    Quit date: 1990    Years since quitting: 32.6   Smokeless tobacco: Never   Tobacco comments:    but  only for a short amount of time  Vaping Use   Vaping Use: Never used  Substance and Sexual Activity   Alcohol use: Yes    Alcohol/week: 0.0 - 1.0 standard drinks    Comment: occasional glass of wine   Drug use: No   Sexual activity: Yes    Partners: Male  Other Topics Concern   Not on file  Social History Narrative   Not on file   Social Determinants of Health   Financial Resource Strain: Low Risk    Difficulty of Paying Living Expenses: Not hard at all  Food Insecurity: No Food Insecurity   Worried About Charity fundraiser in the Last Year: Never true   [place] in the Last Year: Never true  Transportation Needs: No Transportation Needs   Lack of Transportation (Medical): No   Lack of Transportation (Non-Medical): No  Physical Activity: Sufficiently  Active   Days of Exercise per Week: 5 days   Minutes of Exercise per Session: 60 min  Stress: No Stress Concern Present   Feeling of Stress : Only a little  Social Connections: Moderately Integrated   Frequency of Communication with Friends and Family: More than three times a week   Frequency of Social Gatherings with Friends and Family: Once a week   Attends Religious Services: More than 4 times per year   Active Member of [employer] or Organizations: Yes   Attends Archivist Meetings: More than 4 times per year   Marital Status: Widowed  Human resources officer Violence: Not At Risk   Fear of Current or Ex-Partner: No   Emotionally Abused: No   Physically Abused: No   Sexually Abused: No    Review of Systems: See HPI, otherwise negative ROS  Physical Exam: BP (!) 143/98   Pulse 75   Temp (!) 96.9 F (36.1 C) (Temporal)   Resp 20   Ht 5' 4"  (1.626 m)   Wt 65.8 kg   SpO2 100%   BMI 24.89 kg/m  General:   Alert,  pleasant and cooperative in NAD Head:  Normocephalic and atraumatic. Neck:  Supple; no masses or thyromegaly. Lungs:  Clear throughout to auscultation.    Heart:  Regular rate and rhythm. Abdomen:  Soft, nontender and nondistended. Normal bowel sounds, without guarding, and without rebound.   Neurologic:  Alert and  oriented x4;  grossly normal neurologically.  Impression/Plan: Samantha Stephens is here for an colonoscopy to be performed for h/o crohn's  Risks, benefits, limitations, and alternatives regarding  colonoscopy have been reviewed with the patient.  Questions have been answered.  All parties agreeable.   Sherri Sear, MD  06/18/2021, 7:45 AM

## 2021-06-18 NOTE — Anesthesia Postprocedure Evaluation (Signed)
Anesthesia Post Note  Patient: Samantha Stephens  Procedure(s) Performed: COLONOSCOPY WITH PROPOFOL  Patient location during evaluation: PACU Anesthesia Type: General Level of consciousness: awake and alert Pain management: pain level controlled Vital Signs Assessment: post-procedure vital signs reviewed and stable Respiratory status: spontaneous breathing, nonlabored ventilation, respiratory function stable and patient connected to nasal cannula oxygen Cardiovascular status: blood pressure returned to baseline and stable Postop Assessment: no apparent nausea or vomiting Anesthetic complications: no   No notable events documented.   Last Vitals:  Vitals:   06/18/21 0837 06/18/21 0847  BP: 110/74 128/81  Pulse: 70 65  Resp: 17 15  Temp: (!) 35.7 C   SpO2: 99% 100%    Last Pain:  Vitals:   06/18/21 0847  TempSrc:   PainSc: 0-No pain                 Molli Barrows

## 2021-06-19 ENCOUNTER — Encounter: Payer: Self-pay | Admitting: Gastroenterology

## 2021-06-19 ENCOUNTER — Other Ambulatory Visit: Payer: Self-pay | Admitting: Gastroenterology

## 2021-06-19 ENCOUNTER — Telehealth: Payer: Self-pay

## 2021-06-19 DIAGNOSIS — K5010 Crohn's disease of large intestine without complications: Secondary | ICD-10-CM

## 2021-06-19 LAB — SURGICAL PATHOLOGY

## 2021-06-19 MED ORDER — PREDNISONE 10 MG PO TABS: ORAL_TABLET | ORAL | 0 refills | Status: AC

## 2021-06-19 NOTE — Telephone Encounter (Signed)
-----   Message from Lin Landsman, MD sent at 06/19/2021  3:39 PM EDT ----- Please inform patient that she has Crohn's colitis.  Ordered labs and inform patient to get those done.  Recommend to start prednisone 40 mg daily for 2 weeks, decrease by 10 mg weekly until finished.  Please tell patient that she goes for blood work before starting the prednisone.  I need to see her in the next  2 weeks to discuss about further management.  Okay to [employer]

## 2021-06-19 NOTE — Telephone Encounter (Signed)
Patient verbalized understanding of results and Sent prednisone to the pharmacy. She will go get lab work done. Made follow up appointment for 07/02/21

## 2021-06-28 LAB — HCV RT-PCR, QUANT (NON-GRAPH): Hepatitis C Quantitation: NOT DETECTED IU/mL

## 2021-06-28 LAB — QUANTIFERON-TB GOLD PLUS
QuantiFERON Mitogen Value: >10 IU/mL
QuantiFERON Nil Value: 0.06 IU/mL
QuantiFERON TB1 Ag Value: 0.08 IU/mL
QuantiFERON TB2 Ag Value: 0.04 IU/mL
QuantiFERON-TB Gold Plus: NEGATIVE

## 2021-06-28 LAB — HEPATITIS B CORE ANTIBODY, TOTAL: Hep B Core Total Ab: NEGATIVE

## 2021-06-28 LAB — HCV AB W REFLEX TO QUANT PCR: HCV Ab: 2.7 s/co ratio — ABNORMAL HIGH (ref 0.0–0.9)

## 2021-06-28 LAB — C-REACTIVE PROTEIN: CRP: 3 mg/L (ref 0–10)

## 2021-06-28 LAB — THIOPURINE METHYLTRANSFERASE (TPMT), RBC: TPMT Activity:: 8.2 Units/mL RBC

## 2021-06-28 LAB — HEPATITIS B SURFACE ANTIGEN: Hepatitis B Surface Ag: NEGATIVE

## 2021-06-28 LAB — HEPATITIS B SURFACE ANTIBODY,QUALITATIVE: Hep B Surface Ab, Qual: NONREACTIVE

## 2021-06-28 LAB — HEPATITIS A ANTIBODY, TOTAL: hep A Total Ab: NEGATIVE

## 2021-07-02 ENCOUNTER — Ambulatory Visit: Payer: Managed Care, Other (non HMO) | Admitting: Gastroenterology

## 2021-07-02 ENCOUNTER — Encounter: Payer: Self-pay | Admitting: Gastroenterology

## 2021-07-02 ENCOUNTER — Other Ambulatory Visit: Payer: Self-pay

## 2021-07-02 VITALS — BP 137/84 | HR 79 | Temp 98.1°F | Ht 65.0 in | Wt 145.2 lb

## 2021-07-02 DIAGNOSIS — K50111 Crohn's disease of large intestine with rectal bleeding: Secondary | ICD-10-CM | POA: Diagnosis not present

## 2021-07-02 NOTE — Progress Notes (Signed)
   R , MD [address]  [address]  [place], [place] [address]  Main: [phone]  Fax: [phone]    Gastroenterology Consultation  Referring Provider:     Sowles, Krichna, MD Primary Care Physician:  Sowles, Krichna, MD Primary Gastroenterologist:  Dr.  R  Reason for Consultation: Crohn's disease        HPI:   Samantha Stephens is a 60 y.o. female referred by Dr. Sowles, Krichna, MD  for consultation & management of possible Crohn's disease.  Patient reports that she was diagnosed with Crohn's disease right after college when she has significant weight loss, abdominal pain associated with severe diarrhea.  She was diagnosed after the colonoscopy.  Patient reports that she was intermittently on prednisone at the time of initial diagnosis.  She is not on any maintenance therapy to date.  She reports symptoms of right lower quadrant pain associated with urgency and loose stools, on several occasions triggered by stress.  She has been followed by Dr. Elliott, gastroenterologist at [facility] until he retired.  She had a flareup last year, was seen by the PA at [facility] GI, was treated with Bentyl.  Her labs including CRP, ESR, iron panel, CBC and fecal calprotectin levels were unremarkable.  Patient lives by herself after her husband passed away about 3 years ago.  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  She does have diabetes, her most recent hemoglobin A1c is 7.6.  She is very active, walks every day.  Patient does not smoke or drink alcohol  Follow-up visit 07/02/2021 Patient is here for follow-up of Crohn's colitis.  Patient underwent colonoscopy due to recent flareup of history of Crohn's, biopsies confirmed Crohn's colitis, mild activity involving entire colon.  TI was normal.  I started her on prednisone 40 mg daily which has resolved her symptoms.  She is here to discuss about long-term management.  NSAIDs:  None  Antiplts/Anticoagulants/Anti thrombotics: None  GI Procedures:  Colonoscopy 06/17/2017 by Dr. Elliott Excellent prep, internal hemorrhoids were found.  Exam was otherwise normal.  Terminal ileum was not examined  Colonoscopy 06/18/2021 - The examined portion of the ileum was normal. Biopsied. - Normal mucosa in the right colon. Biopsied. - Diffuse mild inflammation was found in the sigmoid colon and in the descending colon secondary to left-sided colitis. Biopsied. - The distal rectum and anal verge are normal on retroflexion view. DIAGNOSIS:  A. TERMINAL ILEUM; COLD BIOPSY:  - FRAGMENTED PORTIONS OF ENTERIC MUCOSA WITH NORMAL VILLOUS ARCHITECTURE  AND REACTIVE LYMPHOID HYPERPLASIA.  - FRAGMENTS OF UNREMARKABLE COLONIC MUCOSA.  - NEGATIVE FOR ACTIVE MUCOSAL ILEITIS/COLITIS.  - NEGATIVE FOR GRANULOMA, DYSPLASIA, AND MALIGNANCY.   B. COLON, RIGHT; COLD BIOPSY:  - CHRONIC COLITIS WITH MILD ACTIVITY (CRYPTITIS).  - SINGLE SMALL SUBMUCOSAL NON-NECROTIZING GRANULOMA IDENTIFIED.  - NEGATIVE FOR DYSPLASIA AND MALIGNANCY.   C. COLON, LEFT; COLD BIOPSY:  - CHRONIC COLITIS WITH MILD ACTIVITY (CRYPTITIS).  - SINGLE SMALL SUBMUCOSAL NON-NECROTIZING GRANULOMA IDENTIFIED.  - NEGATIVE FOR DYSPLASIA AND MALIGNANCY.   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 disorder    Palpitations      Scoliosis 03/08/2016   Sleep apnea    Stress headaches     Past Surgical History:  Procedure Laterality Date   ABDOMINAL HYSTERECTOMY     CESAREAN SECTION     1   COLONOSCOPY     COLONOSCOPY WITH PROPOFOL N/A 06/18/2021   Procedure: COLONOSCOPY WITH PROPOFOL;  Surgeon: Lin Landsman, MD;  Location: [place];  Service: Gastroenterology;  Laterality: N/A;   CYSTOSCOPY W/ RETROGRADES Bilateral 11/25/2016   Procedure: CYSTOSCOPY WITH RETROGRADE PYELOGRAM;  Surgeon: Hollice Espy, MD;  Location: [facility] OR[facility];  Service: Urology;  Laterality: Bilateral;   EYE SURGERY     lasix eye surgery    Current Outpatient Medications:    amLODipine (NORVASC) 2.5 MG tablet, TAKE 3 TABLETS(7.5 MG) BY MOUTH DAILY, Disp: 270 tablet, Rfl: 0   Cholecalciferol (VITAMIN D3) 2000 units TABS, Take 1 capsule by mouth daily., Disp: , Rfl:    dapagliflozin propanediol (FARXIGA) 10 MG TABS tablet, Take 1 tablet (10 mg total) by mouth daily before breakfast., Disp: 90 tablet, Rfl: 1   ezetimibe (ZETIA) 10 MG tablet, TAKE 1 TABLET(10 MG) BY MOUTH DAILY FOR CHOLESTEROL, Disp: 90 tablet, Rfl: 0   Multiple Vitamin (MULTIVITAMIN) tablet, Take 1 tablet by mouth daily., Disp: , Rfl:    predniSONE (DELTASONE) 10 MG tablet, Take 4 tablets (40 mg total) by mouth daily with breakfast for 14 days, THEN 3 tablets (30 mg total) daily with breakfast for 7 days, THEN 2 tablets (20 mg total) daily with breakfast for 7 days, THEN 1 tablet (10 mg total) daily with breakfast for 7 days., Disp: 98 tablet, Rfl: 0   VASCEPA 1 g capsule, Take 2 capsules (2 g total) by mouth 2 (two) times daily., Disp: 360 capsule, Rfl: 1    Family History  Problem Relation Age of Onset   Arthritis Mother    COPD Mother    Depression Mother    Diabetes Mother    Hyperlipidemia Mother    Hypertension Mother    Cataracts Mother    Heart disease Mother    Hearing loss Father    Kidney disease Father    Cataracts Father    Cancer - Prostate Father        kidney and prostate cancer, colon cancer   Asthma Sister    Cancer Paternal Grandmother    Mental illness Sister    Breast cancer Other      Social History   Tobacco Use   Smoking status: Former    Types: Cigarettes    Quit date: 1990    Years since quitting: 32.7    Smokeless tobacco: Never   Tobacco comments:    but only for a short amount of time  Vaping Use   Vaping Use: Never used  Substance Use Topics   Alcohol use: Yes    Alcohol/week: 0.0 - 1.0 standard drinks    Comment: occasional glass of wine   Drug use: No    Allergies as of 07/02/2021 - Review Complete 07/02/2021  Allergen Reaction Noted   Trazodone and nefazodone Other (See Comments) 11/22/2016   Benicar [olmesartan] Cough 02/02/2018   Statins  04/19/2019    Review of Systems:    All systems reviewed and negative except where noted in HPI.   Physical Exam:  BP 137/84 (BP Location: Right Arm, Patient Position: Sitting, Cuff Size: Normal)   Pulse 79   Temp 98.1 F (36.7 C) (Oral)   Ht 5' 5" (1.651 m)  Wt 145 lb 3.2 oz (65.9 kg)   BMI 24.16 kg/m  No LMP recorded. Patient has had a hysterectomy.  General:   Alert,  Well-developed, well-nourished, pleasant and cooperative in NAD Head:  Normocephalic and atraumatic. Eyes:  Sclera clear, no icterus.   Conjunctiva pink. Ears:  Normal auditory acuity. Nose:  No deformity, discharge, or lesions. Mouth:  No deformity or lesions,oropharynx pink & moist. Neck:  Supple; no masses or thyromegaly. Lungs:  Respirations even and unlabored.  Clear throughout to auscultation.   No wheezes, crackles, or rhonchi. No acute distress. Heart:  Regular rate and rhythm; no murmurs, clicks, rubs, or gallops. Abdomen:  Normal bowel sounds. Soft, non-tender and non-distended without masses, hepatosplenomegaly or hernias noted.  No guarding or rebound tenderness.   Rectal: Not performed Msk:  Symmetrical without gross deformities. Good, equal movement & strength bilaterally. Pulses:  Normal pulses noted. Extremities:  No clubbing or edema.  No cyanosis. Neurologic:  Alert and oriented x3;  grossly normal neurologically. Skin:  Intact without significant lesions or rashes. No jaundice. Psych:  Alert and cooperative. Normal mood and  affect.  Imaging Studies: No recent abdominal imaging  Assessment and Plan:   Samantha Stephens is a 44 y.o. pleasant African-American female with diabetes, history of Crohn's disease diagnosed in her 11s is seen in consultation for recent exacerbation of Crohn's disease.  No evidence of anemia including iron deficiency or B12 deficiency.  Exacerbation of Crohn's disease Colonoscopy confirmed mild chronic Crohn's colitis with presence of nonnecrotizing granuloma  Continue prednisone 40 mg for 2 weeks total followed by quick taper Today, I have discussed with her regarding immunosuppressive therapy given the duration of Crohn's disease and presence of nonnecrotizing granuloma and extent of colitis.  Discussed various therapies including immunomodulators, anti-TNF therapy, and antiintegrin therapy, and anti-interleukin therapy and newer oral agent.  Patient is concerned about long-term commitment to medication and side effects from immunosuppression.  She says she will think about it and let me know   Follow up in 6 months   Cephas Darby, MD

## 2021-07-19 ENCOUNTER — Ambulatory Visit
Admission: RE | Admit: 2021-07-19 | Discharge: 2021-07-19 | Disposition: A | Discharge status: Home / Self Care | Payer: Managed Care, Other (non HMO) | Source: Ambulatory Visit | Attending: Family Medicine | Admitting: Family Medicine

## 2021-07-19 ENCOUNTER — Other Ambulatory Visit: Payer: Self-pay

## 2021-07-19 DIAGNOSIS — Z1231 Encounter for screening mammogram for malignant neoplasm of breast: Secondary | ICD-10-CM | POA: Diagnosis present

## 2021-08-03 ENCOUNTER — Encounter: Payer: Self-pay | Admitting: Family Medicine

## 2021-08-08 ENCOUNTER — Other Ambulatory Visit: Payer: Self-pay | Admitting: Family Medicine

## 2021-08-08 DIAGNOSIS — E1169 Type 2 diabetes mellitus with other specified complication: Secondary | ICD-10-CM

## 2021-08-17 ENCOUNTER — Other Ambulatory Visit: Payer: Self-pay

## 2021-08-20 ENCOUNTER — Ambulatory Visit: Payer: Managed Care, Other (non HMO) | Admitting: Gastroenterology

## 2021-08-20 ENCOUNTER — Encounter: Payer: Self-pay | Admitting: Gastroenterology

## 2021-08-20 ENCOUNTER — Other Ambulatory Visit: Payer: Self-pay

## 2021-08-20 VITALS — BP 135/95 | HR 80 | Temp 98.3°F | Wt 147.6 lb

## 2021-08-20 DIAGNOSIS — K5010 Crohn's disease of large intestine without complications: Secondary | ICD-10-CM | POA: Diagnosis not present

## 2021-08-20 NOTE — Progress Notes (Signed)
Cephas Darby, MD [address]  [place], [place] [address]  Main: [phone]  Fax: [phone]    Gastroenterology Consultation  Referring Provider:     Steele Sizer, MD Primary Care Physician:  Steele Sizer, MD Primary Gastroenterologist:  Dr. Cephas Darby Reason for Consultation: Crohn's disease        HPI:   Samantha Stephens is a 60 y.o. female referred by Dr. Steele Sizer, MD  for consultation & management of possible Crohn's disease.  Patient reports that she was diagnosed with Crohn's disease right after college when she has significant weight loss, abdominal pain associated with severe diarrhea.  She was diagnosed after the colonoscopy.  Patient reports that she was intermittently on prednisone at the time of initial diagnosis.  She is not on any maintenance therapy to date.  She reports symptoms of right lower quadrant pain associated with urgency and loose stools, on several occasions triggered by stress.  She has been followed by Dr. Vira Agar, gastroenterologist at [facility] until he retired.  She had a flareup last year, was seen by the PA at [facility], was treated with Bentyl.  Her labs including CRP, ESR, iron panel, CBC and fecal calprotectin levels were unremarkable.  Patient lives by herself after her husband passed away about 3 years ago.  She works from home to [employer] in the [employer].  She does have diabetes, her most recent hemoglobin A1c is 7.6.  She is very active, walks every day.  Patient does not smoke or drink alcohol  Follow-up visit 07/02/2021 Patient is here for follow-up of Crohn's colitis.  Patient underwent colonoscopy due to recent flareup of history of Crohn's, biopsies confirmed Crohn's colitis, mild activity involving entire colon.  TI was normal.  I started her on prednisone 40 mg daily which has resolved her symptoms.  She is here to discuss about long-term management.  Follow-up visit  08/20/2021 Patient is here to discuss about underlying Crohn's and treatment options.  She completed prednisone taper and reports feeling well.  She was recently evaluated by ophthalmology as she was experiencing redness in her eyes.  She is not sure if her eye symptoms are secondary to underlying Crohn's disease.  She is no longer experiencing eye symptoms.  She denies any GI symptoms.  NSAIDs: None  Antiplts/Anticoagulants/Anti thrombotics: None  GI Procedures:  Colonoscopy 06/17/2017 by Dr. Vira Agar Excellent prep, internal hemorrhoids were found.  Exam was otherwise normal.  Terminal ileum was not examined  Colonoscopy 06/18/2021 - The examined portion of the ileum was normal. Biopsied. - Normal mucosa in the right colon. Biopsied. - Diffuse mild inflammation was found in the sigmoid colon and in the descending colon secondary to left-sided colitis. Biopsied. - The distal rectum and anal verge are normal on retroflexion view. DIAGNOSIS:  A. TERMINAL ILEUM; COLD BIOPSY:  - FRAGMENTED PORTIONS OF ENTERIC MUCOSA WITH NORMAL VILLOUS ARCHITECTURE  AND REACTIVE LYMPHOID HYPERPLASIA.  - FRAGMENTS OF UNREMARKABLE COLONIC MUCOSA.  - NEGATIVE FOR ACTIVE MUCOSAL ILEITIS/COLITIS.  - NEGATIVE FOR GRANULOMA, DYSPLASIA, AND MALIGNANCY.   B. COLON, RIGHT; COLD BIOPSY:  - CHRONIC COLITIS WITH MILD ACTIVITY (CRYPTITIS).  - SINGLE SMALL SUBMUCOSAL NON-NECROTIZING GRANULOMA IDENTIFIED.  - NEGATIVE FOR DYSPLASIA AND MALIGNANCY.   C. COLON, LEFT; COLD BIOPSY:  - CHRONIC COLITIS WITH MILD ACTIVITY (CRYPTITIS).  - SINGLE SMALL SUBMUCOSAL NON-NECROTIZING GRANULOMA IDENTIFIED.  - NEGATIVE FOR DYSPLASIA AND MALIGNANCY.   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   Diabetes mellitus without complication ([place])    Family hx of colon cancer requiring screening colonoscopy 03/08/2016   Headache     Hyperlipidemia    Hypertension    Hypokalemia    Insomnia related to another mental disorder    Palpitations    Scoliosis 03/08/2016   Sleep apnea    Stress headaches     Past Surgical History:  Procedure Laterality Date   ABDOMINAL HYSTERECTOMY     CESAREAN SECTION     1   COLONOSCOPY     COLONOSCOPY WITH PROPOFOL N/A 06/18/2021   Procedure: COLONOSCOPY WITH PROPOFOL;  Surgeon: Lin Landsman, MD;  Location: [facility];  Service: Gastroenterology;  Laterality: N/A;   CYSTOSCOPY W/ RETROGRADES Bilateral 11/25/2016   Procedure: CYSTOSCOPY WITH RETROGRADE PYELOGRAM;  Surgeon: Hollice Espy, MD;  Location: [facility];  Service: Urology;  Laterality: Bilateral;   EYE SURGERY     lasix eye surgery    Current Outpatient Medications:    amLODipine (NORVASC) 2.5 MG tablet, TAKE 3 TABLETS(7.5 MG) BY MOUTH DAILY, Disp: 270 tablet, Rfl: 0   Cholecalciferol (VITAMIN D3) 2000 units TABS, Take 1 capsule by mouth daily., Disp: , Rfl:    dapagliflozin propanediol (FARXIGA) 10 MG TABS tablet, Take 1 tablet (10 mg total) by mouth daily before breakfast., Disp: 90 tablet, Rfl: 1   ezetimibe (ZETIA) 10 MG tablet, TAKE 1 TABLET(10 MG) BY MOUTH DAILY FOR CHOLESTEROL, Disp: 90 tablet, Rfl: 0   Multiple Vitamin (MULTIVITAMIN) tablet, Take 1 tablet by mouth daily., Disp: , Rfl:    neomycin-polymyxin b-dexamethasone (MAXITROL) 3.5-10000-0.1 SUSP, Place 1 drop into the left eye 4 (four) times daily., Disp: , Rfl:    VASCEPA 1 g capsule, TAKE 2 CAPSULES(2 GRAMS) BY MOUTH TWICE DAILY, Disp: 360 capsule, Rfl: 1    Family History  Problem Relation Age of Onset   Arthritis Mother    COPD Mother    Depression Mother    Diabetes Mother    Hyperlipidemia Mother    Hypertension Mother    Cataracts Mother    Heart disease Mother    Hearing loss Father    Kidney disease Father    Cataracts Father    Cancer - Prostate Father        kidney and prostate cancer, colon cancer   Asthma Sister    Cancer  Paternal Grandmother    Mental illness Sister    Breast cancer Other      Social History   Tobacco Use   Smoking status: Former    Types: Cigarettes    Quit date: 1990    Years since quitting: 32.8   Smokeless tobacco: Never   Tobacco comments:    but only for a short amount of time  Vaping Use   Vaping Use: Never used  Substance Use Topics   Alcohol use: Yes    Alcohol/week: 0.0 - 1.0 standard drinks    Comment: occasional glass of wine   Drug use: No    Allergies as of 08/20/2021 - Review Complete 08/20/2021  Allergen Reaction Noted   Trazodone and nefazodone Other (See Comments) 11/22/2016   Benicar [olmesartan] Cough 02/02/2018   Statins  04/19/2019    Review of Systems:    All systems reviewed and negative except where noted in HPI.   Physical Exam:  BP (!) 135/95   Pulse 80   Temp  98.3 F (36.8 C) (Oral)   Wt 147 lb 9.6 oz (67 kg)   BMI 24.56 kg/m  No LMP recorded. Patient has had a hysterectomy.  General:   Alert,  Well-developed, well-nourished, pleasant and cooperative in NAD Head:  Normocephalic and atraumatic. Eyes:  Sclera clear, no icterus.   Conjunctiva pink. Ears:  Normal auditory acuity. Nose:  No deformity, discharge, or lesions. Mouth:  No deformity or lesions,oropharynx pink & moist. Neck:  Supple; no masses or thyromegaly. Lungs:  Respirations even and unlabored.  Clear throughout to auscultation.   No wheezes, crackles, or rhonchi. No acute distress. Heart:  Regular rate and rhythm; no murmurs, clicks, rubs, or gallops. Abdomen:  Normal bowel sounds. Soft, non-tender and non-distended without masses, hepatosplenomegaly or hernias noted.  No guarding or rebound tenderness.   Rectal: Not performed Msk:  Symmetrical without gross deformities. Good, equal movement & strength bilaterally. Pulses:  Normal pulses noted. Extremities:  No clubbing or edema.  No cyanosis. Neurologic:  Alert and oriented x3;  grossly normal neurologically. Skin:   Intact without significant lesions or rashes. No jaundice. Psych:  Alert and cooperative. Normal mood and affect.  Imaging Studies: No recent abdominal imaging  Assessment and Plan:   Yarielys P Dick is a 60 y.o. pleasant African-American female with diabetes, history of Crohn's disease diagnosed in her 32s is seen in consultation for management of Crohn's colitis.  Exacerbation of Crohn's disease in 8/22.  No evidence of anemia including iron deficiency or B12 deficiency.  Exacerbation of Crohn's disease Colonoscopy confirmed mild chronic Crohn's colitis with presence of nonnecrotizing granuloma  S/p short course of prednisone  QuantiFERON gold negative, patient is immune to hepatitis A, patient self cleared hepatitis C, HCV antibody positive, had severe any undetected.  Hepatitis B serologies negative  Patient is interested to try other interview or Humira.  We will obtain copy of records from her ophthalmologist's office.  If there are no eye manifestations from underlying Crohn's disease, we can try Entyvio and patient is agreeable Reminded patient of the stool studies   Follow up in 4 months   Cephas Darby, MD

## 2021-08-22 ENCOUNTER — Other Ambulatory Visit: Payer: Self-pay

## 2021-08-22 ENCOUNTER — Ambulatory Visit: Payer: Managed Care, Other (non HMO) | Admitting: Family Medicine

## 2021-08-22 ENCOUNTER — Encounter: Payer: Self-pay | Admitting: Family Medicine

## 2021-08-22 VITALS — BP 126/70 | HR 93 | Temp 98.2°F | Resp 16 | Ht 65.0 in | Wt 147.0 lb

## 2021-08-22 DIAGNOSIS — Z23 Encounter for immunization: Secondary | ICD-10-CM

## 2021-08-22 DIAGNOSIS — I152 Hypertension secondary to endocrine disorders: Secondary | ICD-10-CM

## 2021-08-22 DIAGNOSIS — K5010 Crohn's disease of large intestine without complications: Secondary | ICD-10-CM

## 2021-08-22 DIAGNOSIS — T466X5A Adverse effect of antihyperlipidemic and antiarteriosclerotic drugs, initial encounter: Secondary | ICD-10-CM

## 2021-08-22 DIAGNOSIS — E1169 Type 2 diabetes mellitus with other specified complication: Secondary | ICD-10-CM | POA: Diagnosis not present

## 2021-08-22 DIAGNOSIS — E1159 Type 2 diabetes mellitus with other circulatory complications: Secondary | ICD-10-CM | POA: Diagnosis not present

## 2021-08-22 DIAGNOSIS — R21 Rash and other nonspecific skin eruption: Secondary | ICD-10-CM

## 2021-08-22 DIAGNOSIS — I10 Essential (primary) hypertension: Secondary | ICD-10-CM

## 2021-08-22 DIAGNOSIS — E785 Hyperlipidemia, unspecified: Secondary | ICD-10-CM

## 2021-08-22 DIAGNOSIS — G720 Drug-induced myopathy: Secondary | ICD-10-CM

## 2021-08-22 LAB — POCT GLYCOSYLATED HEMOGLOBIN (HGB A1C): Hemoglobin A1C: 7.6 % — AB (ref 4.0–5.6)

## 2021-08-22 MED ORDER — TRIAMCINOLONE ACETONIDE 0.1 % EX CREA: 1.0000 "application " | TOPICAL_CREAM | Freq: Two times a day (BID) | CUTANEOUS | 1 refills | Status: AC

## 2021-08-22 MED ORDER — AMLODIPINE BESYLATE 5 MG PO TABS: 5.0000 mg | ORAL_TABLET | Freq: Every day | ORAL | 1 refills | Status: DC

## 2021-08-22 MED ORDER — EZETIMIBE 10 MG PO TABS: 10.0000 mg | ORAL_TABLET | Freq: Every day | ORAL | 1 refills | Status: DC

## 2021-08-22 MED ORDER — DAPAGLIFLOZIN PROPANEDIOL 10 MG PO TABS: 10.0000 mg | ORAL_TABLET | Freq: Every day | ORAL | 1 refills | Status: DC

## 2021-08-22 NOTE — Progress Notes (Signed)
Name: Samantha Stephens   MRN: [id number]    DOB: November 15, 1960   Date:08/22/2021       Progress Note  Subjective  Chief Complaint  Follow up   HPI  DMII: she is on diet only but not as compliant with her diet, A1C went from 7.6 % to 6.9 %and today is up again at 7.6 % , she had a Chron's flare and had to take prednisone   , she is  not on ARB because of history of angioedema . She has dyslipidemia but unable to tolerate statins because it causes severe myalgia, she is taking Zetia,and is back on Vascepa. Did not tolerate Metformin in the past caused indigestion and diarrhea She has been on Farxiga since Spring 2022 She denies polyphagia, polydipsia or polyuria    Dyslipidemia: she has been taking Zetia and also back on Vascepa and is tolerating it well   False positive hepatitis C screen: with negative quantitative test back in Dec 30, 2016, repeated in 05/2020 but confirmatory test was not done , liver enzymes normal. Unchanged    HTN: she has been taking norvasc 7.5 mg  daily, and bp is at goal, not on ARB because angioedema. She denies chest pain or dizziness. She denies any recent episodes of palpitation . She has been walking daily , we may need to go down on Norvasc from 7.5 mg to 28m since we are starting farxiga, but to do it only the week before her next visit    Anxiety: Symptoms started after her husband died 203-09-2018 She is doing well now, staying active - walking daily, going to church, small groups, book club. She is feeling much better.    Vitamin D and B12 : she stopped B12 since it was high, still taking vitamin D supplementation    Insomnia: she has been taking Melatonin sustained release and has been able to fall and stay asleep for 8 hours, when she skips she sleeps at most 6 hours .   OSA: she wears CPAP every night., she states does not sleep well without it . Unchanged    OA hip[facility] 2017 pain is worse at night and feels stiff in the mornings.  Discussed Tylenol up to 3 grams per day.  Doing well   Chron's disease: she was diagnosed in her 89's in remission from her 89'suntil recently. She was having diarrhea and weight loss, had repeat colonoscopy that showed active Chron's in the sigmoid and descending colon. She took prednisone and is feeling well now, she is waiting to find out what medication she will be taking from now on  Rash: she noticed a rash that is erythematous on anterior tibia on both lower legs, she has been using lotion only. 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   Situational anxiety 10/27/2015   Hypertension goal BP (blood pressure) < 130/80 08/18/2015   Insomnia,  controlled 08/18/2015    Past Surgical History:  Procedure Laterality Date   ABDOMINAL HYSTERECTOMY     CESAREAN SECTION     1   COLONOSCOPY     COLONOSCOPY WITH PROPOFOL N/A 06/18/2021   Procedure: COLONOSCOPY WITH PROPOFOL;  Surgeon: Lin Landsman, MD;  Location: [facility];  Service: Gastroenterology;  Laterality: N/A;   CYSTOSCOPY W/ RETROGRADES Bilateral 11/25/2016   Procedure: CYSTOSCOPY WITH RETROGRADE PYELOGRAM;  Surgeon: Hollice Espy, MD;  Location: [facility];  Service: Urology;  Laterality: Bilateral;   EYE SURGERY     lasix eye surgery    Family History  Problem Relation Age of Onset   Arthritis Mother    COPD Mother    Depression Mother    Diabetes Mother    Hyperlipidemia  Mother    Hypertension Mother    Cataracts Mother    Heart disease Mother    Hearing loss Father    Kidney disease Father    Cataracts Father    Cancer - Prostate Father        kidney and prostate cancer, colon cancer   Asthma Sister    Cancer Paternal Grandmother    Mental illness Sister    Breast cancer Other     Social History   Tobacco Use   Smoking status: Former    Types: Cigarettes    Quit date: 1990    Years since quitting: 32.8   Smokeless tobacco: Never   Tobacco comments:    but only for a short amount of time  Substance Use Topics   Alcohol use: Yes    Alcohol/week: 0.0 - 1.0 standard drinks    Comment: occasional glass of wine     Current Outpatient Medications:    Cholecalciferol (VITAMIN D3) 2000 units TABS, Take 1 capsule by mouth daily., Disp: , Rfl:    Multiple Vitamin (MULTIVITAMIN) tablet, Take 1 tablet by mouth daily., Disp: , Rfl:    triamcinolone cream (KENALOG) 0.1 %, Apply 1 application topically 2 (two) times daily., Disp: 30 g, Rfl: 1   VASCEPA 1 g capsule, TAKE 2 CAPSULES(2 GRAMS) BY MOUTH TWICE DAILY, Disp: 360 capsule, Rfl: 1   amLODipine (NORVASC) 5 MG tablet, Take 1 tablet (5 mg total) by mouth daily., Disp: 90 tablet, Rfl: 1   dapagliflozin propanediol (FARXIGA) 10 MG TABS tablet, Take 1 tablet (10 mg total) by mouth daily before breakfast., Disp: 90 tablet, Rfl: 1   ezetimibe (ZETIA) 10 MG tablet, Take 1 tablet (10 mg total) by mouth daily., Disp: 90 tablet, Rfl: 1  Allergies  Allergen Reactions   Trazodone And Nefazodone Other (See Comments)    Syncope; see in ER Feb 2018   Benicar [Olmesartan] Cough   Statins     I personally reviewed active problem list, medication list, allergies, family history, social history with the patient/caregiver today.   ROS  Constitutional: Negative for fever or weight change.  Respiratory: Negative for cough and shortness of breath.   Cardiovascular: Negative for chest pain or palpitations.   Gastrointestinal: Negative for abdominal pain, no bowel changes.  Musculoskeletal: Negative for gait problem or joint swelling.  Skin: Negative for rash.  Neurological: Negative for dizziness or headache.  No other specific complaints in a complete review of systems (except as listed in HPI above).   Objective  Vitals:   08/22/21 1524  BP: 126/70  Pulse: 93  Resp: 16  Temp: 98.2 F (36.8 C)  SpO2: 95%  Weight: 147 lb (66.7 kg)  Height: _0  (1.651 m)    Body mass index is 24.46 kg/m.  Physical Exam  Constitutional: Patient appears well-developed and well-nourished.  No distress.  HEENT: head atraumatic, normocephalic, pupils equal and reactive to light,  neck supple Cardiovascular: Normal rate, regular rhythm and normal heart sounds.  No murmur heard. No BLE edema. Pulmonary/Chest: Effort normal and breath sounds normal. No respiratory distress. Abdominal: Soft.  There is no tenderness. Psychiatric: Patient has a normal mood and affect. behavior is normal. Judgment and thought content normal.   Recent Results (from the past 2160 hour(s))  CBC with Differential/Platelet     Status: Abnormal   Collection Time: 06/01/21  9:56 AM  Result Value Ref Range   WBC 6.9 3.4 - 10.8 x10E3/uL   RBC 5.75 (H) 3.77 - 5.28 x10E6/uL   Hemoglobin 14.5 11.1 - 15.9 g/dL   Hematocrit 43.5 34.0 - 46.6 %   MCV 76 (L) 79 - 97 fL   MCH 25.2 (L) 26.6 - 33.0 pg   MCHC 33.3 31.5 - 35.7 g/dL   RDW 14.6 11.7 - 15.4 %   Platelets 326 150 - 450 x10E3/uL   Neutrophils 54 Not Estab. %   Lymphs 37 Not Estab. %   Monocytes 5 Not Estab. %   Eos 3 Not Estab. %   Basos 1 Not Estab. %   Neutrophils Absolute 3.7 1.4 - 7.0 x10E3/uL   Lymphocytes Absolute 2.6 0.7 - 3.1 x10E3/uL   Monocytes Absolute 0.4 0.1 - 0.9 x10E3/uL   EOS (ABSOLUTE) 0.2 0.0 - 0.4 x10E3/uL   Basophils Absolute 0.1 0.0 - 0.2 x10E3/uL   Immature Granulocytes 0 Not Estab. %   Immature Grans (Abs) 0.0 0.0 - 0.1 x10E3/uL  Lipid panel      Status: Abnormal   Collection Time: 06/01/21  9:56 AM  Result Value Ref Range   Cholesterol, Total 195 100 - 199 mg/dL   Triglycerides 94 0 - 149 mg/dL   HDL 68 >39 mg/dL   VLDL Cholesterol Cal 17 5 - 40 mg/dL   LDL Chol Calc (NIH) 110 (H) 0 - 99 mg/dL   Chol/HDL Ratio 2.9 0.0 - 4.4 ratio    Comment:                                   T. Chol/HDL Ratio                                             Men  Women                               1/2 Avg.Risk  3.4    3.3                                   Avg.Risk  5.0    4.4                                2X Avg.Risk  9.6    7.1  3X Avg.Risk 23.4   11.0   Comprehensive metabolic panel     Status: Abnormal   Collection Time: 06/01/21  9:56 AM  Result Value Ref Range   Glucose 128 (H) 65 - 99 mg/dL   BUN 14 8 - 27 mg/dL   Creatinine, Ser 1.00 0.57 - 1.00 mg/dL   eGFR 64 >59 mL/min/1.73   BUN/Creatinine Ratio 14 12 - 28   Sodium 140 134 - 144 mmol/L   Potassium 4.4 3.5 - 5.2 mmol/L   Chloride 100 96 - 106 mmol/L   CO2 22 20 - 29 mmol/L   Calcium 9.7 8.7 - 10.3 mg/dL   Total Protein 7.7 6.0 - 8.5 g/dL   Albumin 5.2 (H) 3.8 - 4.9 g/dL   Globulin, Total 2.5 1.5 - 4.5 g/dL   Albumin/Globulin Ratio 2.1 1.2 - 2.2   Bilirubin Total 0.4 0.0 - 1.2 mg/dL   Alkaline Phosphatase 100 44 - 121 IU/L   AST 27 0 - 40 IU/L   ALT 26 0 - 32 IU/L  Hemoglobin A1c     Status: Abnormal   Collection Time: 06/01/21  9:56 AM  Result Value Ref Range   Hgb A1c MFr Bld 6.9 (H) 4.8 - 5.6 %    Comment:          Prediabetes: 5.7 - 6.4          Diabetes: >6.4          Glycemic control for adults with diabetes: <7.0    Est. average glucose Bld gHb Est-mCnc 151 mg/dL  VITAMIN D 25 Hydroxy (Vit-D Deficiency, Fractures)     Status: None   Collection Time: 06/01/21  9:56 AM  Result Value Ref Range   Vit D, 25-Hydroxy 49.8 30.0 - 100.0 ng/mL    Comment: Vitamin D deficiency has been defined by the Institute of Medicine and an Endocrine  Society practice guideline as a level of serum 25-OH vitamin D less than 20 ng/mL (1,2). The Endocrine Society went on to further define vitamin D insufficiency as a level between 21 and 29 ng/mL (2). 1. IOM (Institute of Medicine). 2010. Dietary reference    intakes for calcium and D. [place]: The    [employer]. 2. Holick MF, [place], Bischoff-Ferrari HA, et al.    Evaluation, treatment, and prevention of vitamin D    deficiency: an Endocrine Society clinical practice    guideline. JCEM. 2011 Jul; 96(7):1911-30.   Vitamin B12     Status: Abnormal   Collection Time: 06/01/21  9:56 AM  Result Value Ref Range   Vitamin B-12 1,923 (H) 232 - 1,245 pg/mL  Microalbumin / creatinine urine ratio     Status: None   Collection Time: 06/01/21  9:56 AM  Result Value Ref Range   Creatinine, Urine 161.9 Not Estab. mg/dL   Microalbumin, Urine 15.0 Not Estab. ug/mL   Microalb/Creat Ratio 9 0 - 29 mg/g creat    Comment:                        Normal:                0 -  29                        Moderately increased: 30 - 300  Severely increased:       >300   Iron and TIBC     Status: None   Collection Time: 06/01/21  9:56 AM  Result Value Ref Range   Total Iron Binding Capacity 292 250 - 450 ug/dL   UIBC 214 131 - 425 ug/dL   Iron 78 27 - 159 ug/dL   Iron Saturation 27 15 - 55 %  Specimen status report     Status: None   Collection Time: 06/01/21  9:56 AM  Result Value Ref Range   specimen status report Comment     Comment: Written Authorization Written Authorization Written Authorization Received. Authorization received from [employer] 06-07-2021 Logged by Corliss Blacker   Glucose, capillary     Status: Abnormal   Collection Time: 06/18/21  7:44 AM  Result Value Ref Range   Glucose-Capillary 129 (H) 70 - 99 mg/dL    Comment: Glucose reference range applies only to samples taken after fasting for at least 8 hours.  Surgical pathology      Status: None   Collection Time: 06/18/21  8:25 AM  Result Value Ref Range   SURGICAL PATHOLOGY      SURGICAL PATHOLOGY CASE: [id number] [phone] PATIENT: [facility] Report     Specimen Submitted: A. Terminal ileum; cbx B. Colon, right; cbx C. Colon, left; cbx  Clinical History: History of Crohn's disease Z87.19.  Colitis    DIAGNOSIS: A. TERMINAL ILEUM; COLD BIOPSY: - FRAGMENTED PORTIONS OF ENTERIC MUCOSA WITH NORMAL VILLOUS ARCHITECTURE AND REACTIVE LYMPHOID HYPERPLASIA. - FRAGMENTS OF UNREMARKABLE COLONIC MUCOSA. - NEGATIVE FOR ACTIVE MUCOSAL ILEITIS/COLITIS. - NEGATIVE FOR GRANULOMA, DYSPLASIA, AND MALIGNANCY.  B. COLON, RIGHT; COLD BIOPSY: - CHRONIC COLITIS WITH MILD ACTIVITY (CRYPTITIS). - SINGLE SMALL SUBMUCOSAL NON-NECROTIZING GRANULOMA IDENTIFIED. - NEGATIVE FOR DYSPLASIA AND MALIGNANCY.  C. COLON, LEFT; COLD BIOPSY: - CHRONIC COLITIS WITH MILD ACTIVITY (CRYPTITIS). - SINGLE SMALL SUBMUCOSAL NON-NECROTIZING GRANULOMA IDENTIFIED. - NEGATIVE FOR DYSPLASIA AND MALIGNANCY.  GROSS DESCRIPTION: A. Labeled: Terminal ileum cbxs, h istory Crohn's Received: Formalin Collection time: 8:25 AM on 06/18/2021 Placed into formalin time: 8:25 AM on 06/18/2021 Tissue fragment(s): 2 Size: Each 0.4 cm Description: Tan soft tissue fragments Entirely submitted in 1 cassette.  B. Labeled: Right colon cbxs, chronic diarrhea, history of Crohn's Received: Formalin Collection time: 8:27 AM on 06/18/2021 Placed into formalin time: 8:27 AM on 06/18/2021 Tissue fragment(s): Multiple Size: Aggregate, 0.8 x 0.7 x 0.2 cm Description: Tan-pink soft tissue fragments Entirely submitted in 1 cassette.  C. Labeled: Left colon cbxs, chronic diarrhea, history of Crohn's Received: Formalin Collection time: 8:30 AM on 06/18/2021 Placed into formalin time: 8:30 AM on 06/18/2021 Tissue fragment(s): Multiple Size: Aggregate, 0.9 x 0.7 x 0.2 cm Description: Tan-pink soft tissue  fragments Entirely submitted in 1 cassette.  RB 06/18/2021   Final Diagnosis performed by Allena Napoleon, MD.   Electronically signed 06/19/2021 12:36:39P M The electronic signature indicates that the named Attending Pathologist has evaluated the specimen Technical component performed at [facility], [address] [place], [place] [address] Lab: [phone] Dir: Rush Farmer, MD, MMM  Professional component performed at [facility], [facility], [place], [place], [place] [address] Lab: [phone] Dir: Dellia Nims. Rubinas, MD   Hepatitis B core antibody, total     Status: None   Collection Time: 06/20/21 11:46 AM  Result Value Ref Range   Hep B Core Total Ab Negative Negative  Hepatitis B surface antibody,qualitative     Status: None   Collection Time: 06/20/21  11:46 AM  Result Value Ref Range   Hep B Surface Ab, Qual Non Reactive     Comment:               Non Reactive: Inconsistent with immunity,                             less than 10 mIU/mL               Reactive:     Consistent with immunity,                             greater than 9.9 mIU/mL   Hepatitis B surface antigen     Status: None   Collection Time: 06/20/21 11:46 AM  Result Value Ref Range   Hepatitis B Surface Ag Negative Negative  QuantiFERON-TB Gold Plus     Status: None   Collection Time: 06/20/21 11:46 AM  Result Value Ref Range   QuantiFERON Incubation Incubation performed.    QuantiFERON Criteria Comment     Comment: QuantiFERON-TB Gold Plus is a qualitative indirect test for M tuberculosis infection (including disease) and is intended for use in conjunction with risk assessment, radiography, and other medical and diagnostic evaluations. The QuantiFERON-TB Gold Plus result is determined by subtracting the Nil value from either TB antigen (Ag) value. The Mitogen tube serves as a control for the test.    QuantiFERON TB1 Ag Value 0.08 IU/mL   QuantiFERON TB2 Ag Value 0.04 IU/mL    QuantiFERON Nil Value 0.06 IU/mL   QuantiFERON Mitogen Value >10.00 IU/mL   QuantiFERON-TB Gold Plus Negative Negative    Comment: No response to M tuberculosis antigens detected. Infection with M tuberculosis is unlikely, but high risk individuals should be considered for additional testing (ATS/IDSA/CDC Clinical Practice Guidelines, 2017). The reference range is an Antigen minus Nil result of <0.35 IU/mL. Chemiluminescence immunoassay methodology   HCV Ab w Reflex to Quant PCR     Status: Abnormal   Collection Time: 06/20/21 11:46 AM  Result Value Ref Range   HCV Ab 2.7 (H) 0.0 - 0.9 s/co ratio  Hepatitis A antibody, total     Status: None   Collection Time: 06/20/21 11:46 AM  Result Value Ref Range   hep A Total Ab Negative Negative  C-reactive protein     Status: None   Collection Time: 06/20/21 11:46 AM  Result Value Ref Range   CRP 3 0 - 10 mg/L  Thiopurine methyltransferase(tpmt)rbc     Status: None   Collection Time: 06/20/21 11:46 AM  Result Value Ref Range   TPMT Activity: 8.2 Units/mL RBC    Comment: Reference Range: Normal: 15.1 - 26.4 Heterozygous for low TPMT variant: 6.3 - 15.0 Homozygous for low TPMT variant: <6.3    TPMT Interpretation: Comment     Comment: The above results can be interpreted as Heterozygous for a low activity variant of red blood cell Thiopurine Methyltransferase activity. Note that these results are in the low carrier range. Dose reduction may be considered. For patients having an intrinsic low level of TPMT, recent RBC transfusion can variably increase their assayed enzymatic activity depending on the amount and circulating half-life of the transfused red blood cells.  This test was developed and its performance characteristics determined by [facility]. It has not been cleared or approved by the Food and Drug Administration.  This case has been  reviewed, approved, interpreted and electronically signed by Matt Holmes, PhD, [facility].      TPMT Methodology: Comment     Comment: Enzymatic Endpoint/Liquid Chromatography - Tandem Mass Spectrometry (LC-MS/MS)   HCV RT-PCR, Quant (Non-Graph)     Status: None   Collection Time: 06/20/21 11:46 AM  Result Value Ref Range   Hepatitis C Quantitation HCV Not Detected IU/mL   Test Information (HCV): Comment     Comment: The quantitative range of this assay is 15 IU/mL to 100 million IU/mL.   Interpretation (HCV): Comment     Comment: Positive HCV antibody screen without the presence of HCV RNA is consistent with a resolved past infection or a false positive HCV antibody. Consider repeat testing after one month.   POCT HgB A1C     Status: Abnormal   Collection Time: 08/22/21  3:34 PM  Result Value Ref Range   Hemoglobin A1C 7.6 (A) 4.0 - 5.6 %   HbA1c POC (<> result, manual entry)     HbA1c, POC (prediabetic range)     HbA1c, POC (controlled diabetic range)      Diabetic Foot Exam: Diabetic Foot Exam - Simple   Simple Foot Form Visual Inspection No deformities, no ulcerations, no other skin breakdown bilaterally: Yes Sensation Testing Intact to touch and monofilament testing bilaterally: Yes Pulse Check Posterior Tibialis and Dorsalis pulse intact bilaterally: Yes Comments      PHQ2/9: Depression screen [facility] 2/9 08/22/2021 05/22/2021 03/07/2021 10/27/2020 07/18/2020  Decreased Interest 0 0 0 0 0  Down, Depressed, Hopeless 1 0 0 1 0  PHQ - 2 Score 1 0 0 1 0  Altered sleeping 0 - 3 - -  Tired, decreased energy 0 - 0 - -  Change in appetite 0 - 0 - -  Feeling bad or failure about yourself  0 - 0 - -  Trouble concentrating 0 - 0 - -  Moving slowly or fidgety/restless 0 - 0 - -  Suicidal thoughts 0 - 0 - -  PHQ-9 Score 1 - 3 - -  Difficult doing work/chores - - - - -  Some recent data might be hidden    phq 9 is negative   Fall Risk: Fall Risk  08/22/2021 05/22/2021 03/07/2021 10/27/2020 07/18/2020  Falls in the past year? 0 0 0 0 0  Number falls in past yr: 0 0 0 0 0   Injury with Fall? 0 0 0 0 0  Risk for fall due to : No Fall Risks - - - -  Follow up Falls prevention discussed - - Falls evaluation completed -      Assessment & Plan  1. Type 2 diabetes mellitus with other specified complication, without long-term current use of insulin (HCC)  - POCT HgB A1C  We will not adjust medication since she was taking prednisone, recheck in 3 months   2. Need for immunization against influenza  - Flu Vaccine QUAD 6+ mos PF IM (Fluarix Quad PF)  3. Rash in adult  - triamcinolone cream (KENALOG) 0.1 %; Apply 1 application topically 2 (two) times daily.  Dispense: 30 g; Refill: 1  4. Hypertension associated with type 2 diabetes mellitus (HCC)  - dapagliflozin propanediol (FARXIGA) 10 MG TABS tablet; Take 1 tablet (10 mg total) by mouth daily before breakfast.  Dispense: 90 tablet; Refill: 1  5. Dyslipidemia associated with type 2 diabetes mellitus (HCC)  - dapagliflozin propanediol (FARXIGA) 10 MG TABS tablet; Take 1 tablet (10 mg total) by mouth  daily before breakfast.  Dispense: 90 tablet; Refill: 1 - ezetimibe (ZETIA) 10 MG tablet; Take 1 tablet (10 mg total) by mouth daily.  Dispense: 90 tablet; Refill: 1  6. Essential hypertension  - amLODipine (NORVASC) 5 MG tablet; Take 1 tablet (5 mg total) by mouth daily.  Dispense: 90 tablet; Refill: 1  7. Hyperlipidemia LDL goal <70  - ezetimibe (ZETIA) 10 MG tablet; Take 1 tablet (10 mg total) by mouth daily.  Dispense: 90 tablet; Refill: 1  8. Statin myopathy   9. Crohn's disease of large intestine without complication ([place])

## 2021-08-25 LAB — CALPROTECTIN, FECAL: Calprotectin, Fecal: <16 ug/g (ref 0–120)

## 2021-08-26 LAB — GI PROFILE, STOOL, PCR

## 2021-08-27 ENCOUNTER — Encounter: Payer: Self-pay | Admitting: Gastroenterology

## 2021-08-27 ENCOUNTER — Other Ambulatory Visit: Payer: Self-pay

## 2021-08-27 ENCOUNTER — Emergency Department
Admission: EM | Admit: 2021-08-27 | Discharge: 2021-08-27 | Disposition: A | Discharge status: Home / Self Care | Payer: Managed Care, Other (non HMO) | Source: Home / Self Care

## 2021-08-27 ENCOUNTER — Ambulatory Visit: Payer: Managed Care, Other (non HMO) | Admitting: Gastroenterology

## 2021-08-27 VITALS — BP 126/83 | HR 102 | Temp 98.6°F | Ht 65.0 in | Wt 143.8 lb

## 2021-08-27 DIAGNOSIS — R109 Unspecified abdominal pain: Secondary | ICD-10-CM | POA: Diagnosis not present

## 2021-08-27 DIAGNOSIS — R1084 Generalized abdominal pain: Secondary | ICD-10-CM

## 2021-08-27 DIAGNOSIS — R112 Nausea with vomiting, unspecified: Secondary | ICD-10-CM

## 2021-08-27 MED ORDER — ONDANSETRON HCL 4 MG PO TABS: 4.0000 mg | ORAL_TABLET | Freq: Three times a day (TID) | ORAL | 0 refills | Status: DC | PRN

## 2021-08-27 MED ORDER — DICYCLOMINE HCL 10 MG PO CAPS: 10.0000 mg | ORAL_CAPSULE | Freq: Three times a day (TID) | ORAL | 0 refills | Status: DC | PRN

## 2021-08-27 NOTE — Telephone Encounter (Signed)
Called patient and made an urgent visit to be seen in the office today since patient has a long wait in the ER.  She reports nausea, vomiting, epigastric and right upper quadrant pain.  Patient is agreeable  Sherri Sear, MD

## 2021-08-27 NOTE — Progress Notes (Signed)
Samantha Darby, MD [address]  [place]  [place],  [address]  Main: [phone]  Fax: [phone] Pager: [phone]   Primary Care Physician: Steele Sizer, MD  Primary Gastroenterologist:  Dr. Cephas Stephens  Chief Complaint  Patient presents with   Abdominal Pain    HPI: Samantha Stephens is a 60 y.o. female with history of Crohn's colitis mild in severity made an urgent visit for acute onset of abdominal pain.  Patient went to church yesterday for homecoming, had dinner including baked chicken, potato salad, collard greens.  She woke up this morning with severe central abdominal pain associated with nausea and vomiting.  Patient reports that drinking sip of water is triggering her pain.  She called my office, but no one responded and went to ER.  She also messaged me on my chart at the same time.  I called the patient immediately and she was waiting in the ER.  She told me that there is a long wait time and I asked her to see me in the office to evaluate her and she agreed.  Patient denies diarrhea or rectal bleeding.  She reports that the pain episodes are sporadic, intermittent, severe cramping.  She has not tried anything.  She denies fever, chills.  Patient thinks she had a food poisoning  Current Outpatient Medications  Medication Sig Dispense Refill   amLODipine (NORVASC) 5 MG tablet Take 1 tablet (5 mg total) by mouth daily. 90 tablet 1   Cholecalciferol (VITAMIN D3) 2000 units TABS Take 1 capsule by mouth daily.     dapagliflozin propanediol (FARXIGA) 10 MG TABS tablet Take 1 tablet (10 mg total) by mouth daily before breakfast. 90 tablet 1   dicyclomine (BENTYL) 10 MG capsule Take 1 capsule (10 mg total) by mouth 3 (three) times daily as needed for up to 30 doses for spasms. 30 capsule 0   ezetimibe (ZETIA) 10 MG tablet Take 1 tablet (10 mg total) by mouth daily. 90 tablet 1   Multiple Vitamin (MULTIVITAMIN) tablet Take 1 tablet by mouth daily.      ondansetron (ZOFRAN) 4 MG tablet Take 1 tablet (4 mg total) by mouth every 8 (eight) hours as needed for nausea or vomiting. 14 tablet 0   triamcinolone cream (KENALOG) 0.1 % Apply 1 application topically 2 (two) times daily. 30 g 1   VASCEPA 1 g capsule TAKE 2 CAPSULES(2 GRAMS) BY MOUTH TWICE DAILY 360 capsule 1   No current facility-administered medications for this visit.    Allergies as of 08/27/2021 - Review Complete 08/27/2021  Allergen Reaction Noted   Trazodone and nefazodone Other (See Comments) 11/22/2016   Benicar [olmesartan] Cough 02/02/2018   Statins  04/19/2019    NSAIDs: None  Antiplts/Anticoagulants/Anti thrombotics: None  GI procedures:  Colonoscopy 06/17/2017 by Dr. Vira Agar Excellent prep, internal hemorrhoids were found.  Exam was otherwise normal.  Terminal ileum was not examined   Colonoscopy 06/18/2021 - The examined portion of the ileum was normal. Biopsied. - Normal mucosa in the right colon. Biopsied. - Diffuse mild inflammation was found in the sigmoid colon and in the descending colon secondary to left-sided colitis. Biopsied. - The distal rectum and anal verge are normal on retroflexion view. DIAGNOSIS:  A. TERMINAL ILEUM; COLD BIOPSY:  - FRAGMENTED PORTIONS OF ENTERIC MUCOSA WITH NORMAL VILLOUS ARCHITECTURE  AND REACTIVE LYMPHOID HYPERPLASIA.  - FRAGMENTS OF UNREMARKABLE COLONIC MUCOSA.  - NEGATIVE FOR ACTIVE MUCOSAL ILEITIS/COLITIS.  - NEGATIVE FOR GRANULOMA,  DYSPLASIA, AND MALIGNANCY.   B. COLON, RIGHT; COLD BIOPSY:  - CHRONIC COLITIS WITH MILD ACTIVITY (CRYPTITIS).  - SINGLE SMALL SUBMUCOSAL NON-NECROTIZING GRANULOMA IDENTIFIED.  - NEGATIVE FOR DYSPLASIA AND MALIGNANCY.   C. COLON, LEFT; COLD BIOPSY:  - CHRONIC COLITIS WITH MILD ACTIVITY (CRYPTITIS).  - SINGLE SMALL SUBMUCOSAL NON-NECROTIZING GRANULOMA IDENTIFIED.  - NEGATIVE FOR DYSPLASIA AND MALIGNANCY.   ROS:  General: Negative for anorexia, weight loss, fever, chills, fatigue,  weakness. ENT: Negative for hoarseness, difficulty swallowing , nasal congestion. CV: Negative for chest pain, angina, palpitations, dyspnea on exertion, peripheral edema.  Respiratory: Negative for dyspnea at rest, dyspnea on exertion, cough, sputum, wheezing.  GI: See history of present illness. GU:  Negative for dysuria, hematuria, urinary incontinence, urinary frequency, nocturnal urination.  Endo: Negative for unusual weight change.    Physical Examination:   BP 126/83 (BP Location: Left Arm, Patient Position: Sitting, Cuff Size: Normal)   Pulse (!) 102   Temp 98.6 F (37 C) (Oral)   Ht 5' 5"  (1.651 m)   Wt 143 lb 12.8 oz (65.2 kg)   BMI 23.93 kg/m   General: Well-nourished, well-developed in no acute distress.  Eyes: No icterus. Conjunctivae pink. Mouth: Oropharyngeal mucosa moist and pink , no lesions erythema or exudate. Lungs: Clear to auscultation bilaterally. Non-labored. Heart: Regular rate and rhythm, no murmurs rubs or gallops.  Abdomen: Bowel sounds are normal, mild central abdominal tenderness, mildly distended, no hepatosplenomegaly or masses, no hernia , no rebound or guarding.   Extremities: No lower extremity edema. No clubbing or deformities. Neuro: Alert and oriented x 3.  Grossly intact. Skin: Warm and dry, no jaundice.   Psych: Alert and cooperative, normal mood and affect.   Imaging Studies: No results found.  Assessment and Plan:   Samantha Stephens is a 60 y.o. female with history of Crohn's colitis mild in severity currently not on any therapy presented with 1 day history of central abdominal pain associated with nausea and vomiting after having an outside dinner yesterday  Check CBC, CMP Advised to have clear liquids only including popsicles, Gatorade, chicken broth Zofran as needed for nausea Bentyl as needed for abdominal cramps Return to ER precautions discussed with patient and she agreed  Follow up as scheduled   Dr Sherri Sear, MD

## 2021-08-28 ENCOUNTER — Encounter: Payer: Self-pay | Admitting: Gastroenterology

## 2021-08-28 ENCOUNTER — Telehealth: Payer: Self-pay

## 2021-08-28 LAB — COMPREHENSIVE METABOLIC PANEL
ALT: 24 IU/L (ref 0–32)
AST: 22 IU/L (ref 0–40)
Albumin/Globulin Ratio: 2 (ref 1.2–2.2)
Albumin: 4.7 g/dL (ref 3.8–4.8)
Alkaline Phosphatase: 72 IU/L (ref 44–121)
BUN/Creatinine Ratio: 19 (ref 12–28)
BUN: 16 mg/dL (ref 8–27)
Bilirubin Total: 0.4 mg/dL (ref 0.0–1.2)
CO2: 22 mmol/L (ref 20–29)
Calcium: 9.4 mg/dL (ref 8.7–10.3)
Chloride: 105 mmol/L (ref 96–106)
Creatinine, Ser: 0.83 mg/dL (ref 0.57–1.00)
Globulin, Total: 2.3 g/dL (ref 1.5–4.5)
Glucose: 118 mg/dL — ABNORMAL HIGH (ref 70–99)
Potassium: 4.2 mmol/L (ref 3.5–5.2)
Sodium: 144 mmol/L (ref 134–144)
Total Protein: 7 g/dL (ref 6.0–8.5)
eGFR: 80 mL/min/{1.73_m2} (ref >59)

## 2021-08-28 LAB — CBC
Hematocrit: 44.1 % (ref 34.0–46.6)
Hemoglobin: 14.9 g/dL (ref 11.1–15.9)
MCH: 25.6 pg — ABNORMAL LOW (ref 26.6–33.0)
MCHC: 33.8 g/dL (ref 31.5–35.7)
MCV: 76 fL — ABNORMAL LOW (ref 79–97)
Platelets: 272 10*3/uL (ref 150–450)
RBC: 5.81 x10E6/uL — ABNORMAL HIGH (ref 3.77–5.28)
RDW: 16.6 % — ABNORMAL HIGH (ref 11.7–15.4)
WBC: 10.1 10*3/uL (ref 3.4–10.8)

## 2021-08-28 NOTE — Telephone Encounter (Signed)
Called [facility] again and requested last doctors notes for Dr Marius Ditch

## 2021-08-29 ENCOUNTER — Telehealth: Payer: Self-pay

## 2021-08-29 NOTE — Telephone Encounter (Signed)
Got records from [facility] and layed on Dr Marius Ditch desk

## 2021-08-31 NOTE — Progress Notes (Signed)
Name: Samantha Stephens   MRN: [id number]    DOB: 05/03/1961   Date:09/03/2021       Progress Note  Subjective  Chief Complaint  Memory Concerns  I connected with  Lavaeh P Summerhill  on 09/03/21 at  3:40 PM EST by a video enabled telemedicine application and verified that I am speaking with the correct person using two identifiers.  I discussed the limitations of evaluation and management by telemedicine and the availability of in person appointments. The patient expressed understanding and agreed to proceed with the virtual visit  Staff also discussed with the patient that there may be a patient responsible charge related to this service. Patient Location: at home  Provider Location: [facility] Additional Individuals present: alone   HPI  Patient has been dating and her friend told her that she seems forgetful, does not recall what they talked about , even during a dinner date. She states her relatives have not noticed a change. She is able to work her full time job, manage her home, does not get lost when driving. Denies significant  family history of dementia. She is not sure if it is a lack of interest in the relationship but she is now worried about it. 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   Situational anxiety 10/27/2015   Hypertension goal BP (blood pressure) < 130/80 08/18/2015   Insomnia, controlled 08/18/2015    Past Surgical  History:  Procedure Laterality Date   ABDOMINAL HYSTERECTOMY     CESAREAN SECTION     1   COLONOSCOPY     COLONOSCOPY WITH PROPOFOL N/A 06/18/2021   Procedure: COLONOSCOPY WITH PROPOFOL;  Surgeon: Lin Landsman, MD;  Location: [facility];  Service: Gastroenterology;  Laterality: N/A;   CYSTOSCOPY W/ RETROGRADES Bilateral 11/25/2016   Procedure: CYSTOSCOPY WITH RETROGRADE PYELOGRAM;  Surgeon: Hollice Espy, MD;  Location: [facility];  Service: Urology;  Laterality: Bilateral;   EYE SURGERY     lasix eye surgery    Family History  Problem Relation Age of Onset   Arthritis Mother    COPD Mother    Depression Mother    Diabetes Mother    Hyperlipidemia Mother    Hypertension Mother    Cataracts Mother    Heart disease Mother    Hearing loss Father    Kidney disease Father    Cataracts Father    Cancer - Prostate Father        kidney and prostate cancer, colon cancer   Asthma Sister    Cancer Paternal Grandmother    Mental illness Sister    Breast cancer Other     Social History   Socioeconomic History   Marital status: Widowed    Spouse name: Not on file   Number of children: 1   Years of education: Not on file   Highest education level: Bachelor's degree (e.g., BA, AB, BS)  Occupational  History   Occupation: IT   Tobacco Use   Smoking status: Former    Types: Cigarettes    Quit date: 1990    Years since quitting: 32.8   Smokeless tobacco: Never   Tobacco comments:    but only for a short amount of time  Vaping Use   Vaping Use: Never used  Substance and Sexual Activity   Alcohol use: Yes    Alcohol/week: 0.0 - 1.0 standard drinks    Comment: occasional glass of wine   Drug use: No   Sexual activity: Yes    Partners: Male  Other Topics Concern   Not on file  Social History Narrative   Not on file   Social Determinants of Health   Financial Resource Strain: Low Risk    Difficulty of Paying Living Expenses: Not hard at all  Food Insecurity: No  Food Insecurity   Worried About Charity fundraiser in the Last Year: Never true   Banning in the Last Year: Never true  Transportation Needs: No Transportation Needs   Lack of Transportation (Medical): No   Lack of Transportation (Non-Medical): No  Physical Activity: Sufficiently Active   Days of Exercise per Week: 5 days   Minutes of Exercise per Session: 60 min  Stress: No Stress Concern Present   Feeling of Stress : Only a little  Social Connections: Moderately Integrated   Frequency of Communication with Friends and Family: More than three times a week   Frequency of Social Gatherings with Friends and Family: Once a week   Attends Religious Services: More than 4 times per year   Active Member of [employer] or Organizations: Yes   Attends Archivist Meetings: More than 4 times per year   Marital Status: Widowed  Human resources officer Violence: Not At Risk   Fear of Current or Ex-Partner: No   Emotionally Abused: No   Physically Abused: No   Sexually Abused: No     Current Outpatient Medications:    amLODipine (NORVASC) 5 MG tablet, Take 1 tablet (5 mg total) by mouth daily., Disp: 90 tablet, Rfl: 1   Cholecalciferol (VITAMIN D3) 2000 units TABS, Take 1 capsule by mouth daily., Disp: , Rfl:    dapagliflozin propanediol (FARXIGA) 10 MG TABS tablet, Take 1 tablet (10 mg total) by mouth daily before breakfast., Disp: 90 tablet, Rfl: 1   dicyclomine (BENTYL) 10 MG capsule, Take 1 capsule (10 mg total) by mouth 3 (three) times daily as needed for up to 30 doses for spasms., Disp: 30 capsule, Rfl: 0   ezetimibe (ZETIA) 10 MG tablet, Take 1 tablet (10 mg total) by mouth daily., Disp: 90 tablet, Rfl: 1   Multiple Vitamin (MULTIVITAMIN) tablet, Take 1 tablet by mouth daily., Disp: , Rfl:    ondansetron (ZOFRAN) 4 MG tablet, Take 1 tablet (4 mg total) by mouth every 8 (eight) hours as needed for nausea or vomiting., Disp: 14 tablet, Rfl: 0   triamcinolone cream (KENALOG) 0.1 %,  Apply 1 application topically 2 (two) times daily., Disp: 30 g, Rfl: 1   VASCEPA 1 g capsule, TAKE 2 CAPSULES(2 GRAMS) BY MOUTH TWICE DAILY, Disp: 360 capsule, Rfl: 1  Allergies  Allergen Reactions   Trazodone And Nefazodone Other (See Comments)    Syncope; see in ER Feb 2018   Benicar [Olmesartan] Cough   Statins     I personally reviewed active problem list, medication list, allergies, family history, social history, health maintenance with  the patient/caregiver today.   ROS  Ten systems reviewed and is negative except as mentioned in HPI   Objective  Virtual encounter, vitals not obtained.  There is no height or weight on file to calculate BMI.  Physical Exam  Awake, alert and oriented    PHQ2/9: Depression screen [facility] 2/9 09/03/2021 08/22/2021 05/22/2021 03/07/2021 10/27/2020  Decreased Interest 0 0 0 0 0  Down, Depressed, Hopeless 0 1 0 0 1  PHQ - 2 Score 0 1 0 0 1  Altered sleeping 0 0 - 3 -  Tired, decreased energy 0 0 - 0 -  Change in appetite 0 0 - 0 -  Feeling bad or failure about yourself  0 0 - 0 -  Trouble concentrating 0 0 - 0 -  Moving slowly or fidgety/restless 0 0 - 0 -  Suicidal thoughts 0 0 - 0 -  PHQ-9 Score 0 1 - 3 -  Difficult doing work/chores - - - - -  Some recent data might be hidden   PHQ-2/9 Result is negative.    Fall Risk: Fall Risk  09/03/2021 08/22/2021 05/22/2021 03/07/2021 10/27/2020  Falls in the past year? 0 0 0 0 0  Number falls in past yr: 0 0 0 0 0  Injury with Fall? 0 0 0 0 0  Risk for fall due to : No Fall Risks No Fall Risks - - -  Follow up Falls prevention discussed Falls prevention discussed - - Falls evaluation completed     Assessment & Plan  1. Memory changes  - TSH - Ambulatory referral to Neurology   I discussed the assessment and treatment plan with the patient. The patient was provided an opportunity to ask questions and all were answered. The patient agreed with the plan and demonstrated an understanding of the  instructions.  The patient was advised to call back or seek an in-person evaluation if the symptoms worsen or if the condition fails to improve as anticipated.  I provided 15  minutes of non-face-to-face time during this encounter.

## 2021-09-03 ENCOUNTER — Telehealth (INDEPENDENT_AMBULATORY_CARE_PROVIDER_SITE_OTHER): Payer: Managed Care, Other (non HMO) | Admitting: Family Medicine

## 2021-09-03 DIAGNOSIS — R413 Other amnesia: Secondary | ICD-10-CM

## 2021-09-04 ENCOUNTER — Telehealth: Payer: Self-pay

## 2021-09-04 LAB — TSH: TSH: 0.865 u[IU]/mL (ref 0.450–4.500)

## 2021-09-04 NOTE — Telephone Encounter (Signed)
-----   Message from Lin Landsman, MD sent at 09/03/2021 12:55 PM EST ----- Regarding: Richardean Chimera  I have reviewed her records from ophthalmology office  Please apply for entyvio, new start Dx: Crohn's colitis  RV

## 2021-09-04 NOTE — Telephone Encounter (Signed)
Called patient she understands and I have filled out out part and faxed it and left her part up front for her to fill out an dfax to get entyvio started for patient

## 2021-09-05 ENCOUNTER — Encounter: Payer: Self-pay | Admitting: Family Medicine

## 2021-09-25 ENCOUNTER — Encounter: Payer: Self-pay | Admitting: Gastroenterology

## 2021-09-25 ENCOUNTER — Ambulatory Visit (INDEPENDENT_AMBULATORY_CARE_PROVIDER_SITE_OTHER): Payer: Managed Care, Other (non HMO) | Admitting: Gastroenterology

## 2021-09-25 VITALS — BP 122/87 | HR 89 | Temp 98.4°F | Ht 68.0 in | Wt 144.0 lb

## 2021-09-25 DIAGNOSIS — K5010 Crohn's disease of large intestine without complications: Secondary | ICD-10-CM

## 2021-09-25 DIAGNOSIS — M25559 Pain in unspecified hip: Secondary | ICD-10-CM

## 2021-09-25 NOTE — Progress Notes (Signed)
Cephas Darby, MD [address]  [place]  [place], [place] [address]  Main: [phone]  Fax: [phone]    Gastroenterology Consultation  Referring Provider:     Steele Sizer, MD Primary Care Physician:  Steele Sizer, MD Primary Gastroenterologist:  Dr. Cephas Darby Reason for Consultation: Crohn's disease        HPI:   Samantha Stephens is a 60 y.o. female referred by Dr. Steele Sizer, MD  for consultation & management of possible Crohn's disease.  Patient reports that she was diagnosed with Crohn's disease right after college when she has significant weight loss, abdominal pain associated with severe diarrhea.  She was diagnosed after the colonoscopy.  Patient reports that she was intermittently on prednisone at the time of initial diagnosis.  She is not on any maintenance therapy to date.  She reports symptoms of right lower quadrant pain associated with urgency and loose stools, on several occasions triggered by stress.  She has been followed by Dr. Vira Agar, gastroenterologist at [facility] until he retired.  She had a flareup last year, was seen by the PA at [facility] GI, was treated with Bentyl.  Her labs including CRP, ESR, iron panel, CBC and fecal calprotectin levels were unremarkable.  Patient lives by herself after her husband passed away about 3 years ago.  She works from home to [employer] in the [employer].  She does have diabetes, her most recent hemoglobin A1c is 7.6.  She is very active, walks every day.  Patient does not smoke or drink alcohol  Follow-up visit 07/02/2021 Patient is here for follow-up of Crohn's colitis.  Patient underwent colonoscopy due to recent flareup of history of Crohn's, biopsies confirmed Crohn's colitis, mild activity involving entire colon.  TI was normal.  I started her on prednisone 40 mg daily which has resolved her symptoms.  She is here to discuss about long-term management.  Follow-up visit  08/20/2021 Patient is here to discuss about underlying Crohn's and treatment options.  She completed prednisone taper and reports feeling well.  She was recently evaluated by ophthalmology as she was experiencing redness in her eyes.  She is not sure if her eye symptoms are secondary to underlying Crohn's disease.  She is no longer experiencing eye symptoms.  She denies any GI symptoms.  Follow-up visit 09/25/2021 Patient is here for follow-up of Crohn's colitis.  Patient was evaluated by ophthalmology and there was no evidence of eye manifestations from underlying Crohn's disease.  Patient woke up last night with severe pelvic pain radiating to medial aspect of bilateral legs.  She took Tylenol this morning and pain has significantly improved.  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  She denies any worsening of her GI symptoms.  After discussing various treatment options for management of her disease, patient preferred to start Entyvio.   NSAIDs: None  Antiplts/Anticoagulants/Anti thrombotics: None  GI Procedures:  Colonoscopy 06/17/2017 by Dr. Vira Agar Excellent prep, internal hemorrhoids were found.  Exam was otherwise normal.  Terminal ileum was not examined  Colonoscopy 06/18/2021 - The examined portion of the ileum was normal. Biopsied. - Normal mucosa in the right colon. Biopsied. - Diffuse mild inflammation was found in the sigmoid colon and in the descending colon secondary to left-sided colitis. Biopsied. - The distal rectum and anal verge are normal on retroflexion view. DIAGNOSIS:  A. TERMINAL ILEUM; COLD BIOPSY:  - FRAGMENTED PORTIONS OF ENTERIC MUCOSA WITH NORMAL VILLOUS ARCHITECTURE  AND REACTIVE  LYMPHOID HYPERPLASIA.  - FRAGMENTS OF UNREMARKABLE COLONIC MUCOSA.  - NEGATIVE FOR ACTIVE MUCOSAL ILEITIS/COLITIS.  - NEGATIVE FOR GRANULOMA, DYSPLASIA, AND MALIGNANCY.   B. COLON, RIGHT; COLD BIOPSY:  - CHRONIC COLITIS WITH MILD ACTIVITY (CRYPTITIS).  - SINGLE SMALL  SUBMUCOSAL NON-NECROTIZING GRANULOMA IDENTIFIED.  - NEGATIVE FOR DYSPLASIA AND MALIGNANCY.   C. COLON, LEFT; COLD BIOPSY:  - CHRONIC COLITIS WITH MILD ACTIVITY (CRYPTITIS).  - SINGLE SMALL SUBMUCOSAL NON-NECROTIZING GRANULOMA IDENTIFIED.  - NEGATIVE FOR DYSPLASIA AND MALIGNANCY.   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   Diabetes mellitus without complication ([place])    Family hx of colon cancer requiring screening colonoscopy 03/08/2016   Headache    Hyperlipidemia    Hypertension    Hypokalemia    Insomnia related to another mental disorder    Palpitations    Scoliosis 03/08/2016   Sleep apnea    Stress headaches     Past Surgical History:  Procedure Laterality Date   ABDOMINAL HYSTERECTOMY     CESAREAN SECTION     1   COLONOSCOPY     COLONOSCOPY WITH PROPOFOL N/A 06/18/2021   Procedure: COLONOSCOPY WITH PROPOFOL;  Surgeon: Lin Landsman, MD;  Location: [facility];  Service: Gastroenterology;  Laterality: N/A;   CYSTOSCOPY W/ RETROGRADES Bilateral 11/25/2016   Procedure: CYSTOSCOPY WITH RETROGRADE PYELOGRAM;  Surgeon: Hollice Espy, MD;  Location: [facility];  Service: Urology;  Laterality: Bilateral;   EYE SURGERY     lasix eye surgery    Current Outpatient Medications:    amLODipine (NORVASC) 5 MG tablet, Take 1 tablet (5 mg total) by mouth daily., Disp: 90 tablet, Rfl: 1   Cholecalciferol (VITAMIN D3) 2000 units TABS, Take 1 capsule by mouth daily., Disp: , Rfl:    dapagliflozin propanediol (FARXIGA) 10 MG TABS tablet, Take 1 tablet (10 mg total) by mouth daily before breakfast., Disp: 90 tablet, Rfl: 1   dicyclomine (BENTYL) 10 MG capsule, Take 1 capsule (10 mg total) by mouth 3 (three) times daily as needed for up to 30 doses for spasms., Disp: 30 capsule, Rfl: 0   ezetimibe (ZETIA) 10 MG tablet, Take 1 tablet (10 mg total) by mouth daily., Disp: 90 tablet, Rfl:  1   Multiple Vitamin (MULTIVITAMIN) tablet, Take 1 tablet by mouth daily., Disp: , Rfl:    ondansetron (ZOFRAN) 4 MG tablet, Take 1 tablet (4 mg total) by mouth every 8 (eight) hours as needed for nausea or vomiting., Disp: 14 tablet, Rfl: 0   triamcinolone cream (KENALOG) 0.1 %, Apply 1 application topically 2 (two) times daily., Disp: 30 g, Rfl: 1   VASCEPA 1 g capsule, TAKE 2 CAPSULES(2 GRAMS) BY MOUTH TWICE DAILY, Disp: 360 capsule, Rfl: 1   dapagliflozin propanediol (FARXIGA) 10 MG TABS tablet, Take 1 tablet by mouth daily., Disp: , Rfl:     Family History  Problem Relation Age of Onset   Arthritis Mother    COPD Mother    Depression Mother    Diabetes Mother    Hyperlipidemia Mother    Hypertension Mother    Cataracts Mother    Heart disease Mother    Hearing loss Father    Kidney disease Father    Cataracts Father    Cancer - Prostate Father        kidney and prostate cancer, colon cancer   Asthma Sister    Cancer Paternal  Grandmother    Mental illness Sister    Breast cancer Other      Social History   Tobacco Use   Smoking status: Former    Types: Cigarettes    Quit date: 1990    Years since quitting: 32.9   Smokeless tobacco: Never   Tobacco comments:    but only for a short amount of time  Vaping Use   Vaping Use: Never used  Substance Use Topics   Alcohol use: Yes    Alcohol/week: 0.0 - 1.0 standard drinks    Comment: occasional glass of wine   Drug use: No    Allergies as of 09/25/2021 - Review Complete 09/25/2021  Allergen Reaction Noted   Trazodone and nefazodone Other (See Comments) 11/22/2016   Benicar [olmesartan] Cough 02/02/2018   Statins  04/19/2019    Review of Systems:    All systems reviewed and negative except where noted in HPI.   Physical Exam:  BP 122/87 (BP Location: Right Arm, Patient Position: Sitting, Cuff Size: Normal)   Pulse 89   Temp 98.4 F (36.9 C) (Oral)   Ht 5' 8"  (1.727 m)   Wt 144 lb (65.3 kg)   BMI 21.90  kg/m  No LMP recorded. Patient has had a hysterectomy.  General:   Alert,  Well-developed, well-nourished, pleasant and cooperative in NAD Head:  Normocephalic and atraumatic. Eyes:  Sclera clear, no icterus.   Conjunctiva pink. Ears:  Normal auditory acuity. Nose:  No deformity, discharge, or lesions. Mouth:  No deformity or lesions,oropharynx pink & moist. Neck:  Supple; no masses or thyromegaly. Lungs:  Respirations even and unlabored.  Clear throughout to auscultation.   No wheezes, crackles, or rhonchi. No acute distress. Heart:  Regular rate and rhythm; no murmurs, clicks, rubs, or gallops. Abdomen:  Normal bowel sounds. Soft, non-tender and non-distended without masses, hepatosplenomegaly or hernias noted.  No guarding or rebound tenderness.   Rectal: Not performed Msk:  Symmetrical without gross deformities. Good, equal movement & strength bilaterally. Pulses:  Normal pulses noted. Extremities:  No clubbing or edema.  No cyanosis. Neurologic:  Alert and oriented x3;  grossly normal neurologically. Skin:  Intact without significant lesions or rashes. No jaundice. Psych:  Alert and cooperative. Normal mood and affect.  Imaging Studies: No recent abdominal imaging  Assessment and Plan:   Samantha Stephens is a 33 y.o. pleasant African-American female with diabetes, history of Crohn's disease diagnosed in her 74s is seen in consultation for management of Crohn's colitis.  Exacerbation of Crohn's disease in 8/22.  No evidence of anemia including iron deficiency or B12 deficiency.  Exacerbation of Crohn's disease Colonoscopy confirmed mild chronic Crohn's colitis with presence of nonnecrotizing granuloma  S/p short course of prednisone  QuantiFERON gold negative, patient is immune to hepatitis A, patient self cleared hepatitis C, HCV antibody positive, had severe any undetected.  Hepatitis B serologies negative  Patient is interested to try infusion rather than injectable biologic.   Patient does not have eye manifestations from underlying Crohn's disease, will apply for Entyvio GI profile PCR negative, fecal calprotectin levels were normal 11/22.  Pelvic pain - isolated episode ?  Sacroiliitis from underlying IBD Recommend DEXA scan If pain is persistent, recommend CT or MRI of the pelvis   Follow up in 3-4 months   Cephas Darby, MD

## 2021-09-26 ENCOUNTER — Telehealth: Payer: Self-pay

## 2021-09-26 ENCOUNTER — Other Ambulatory Visit: Payer: Self-pay

## 2021-09-26 DIAGNOSIS — K5010 Crohn's disease of large intestine without complications: Secondary | ICD-10-CM

## 2021-09-26 NOTE — Telephone Encounter (Signed)
CALLED PATIENT ABOUT HER APPOINTMENT AND HAVE IT SET UP FOR Feb 19 2022 FIRST AVAILABLE AT 4:00PM FOR HER DEXASCAN AT [facility]  PHONE NUMBER IS [phone] PATIENT AGREES AND IS GOOD WITH APPOINTMENT

## 2021-10-18 DIAGNOSIS — M7061 Trochanteric bursitis, right hip: Secondary | ICD-10-CM | POA: Insufficient documentation

## 2021-10-19 NOTE — Progress Notes (Unsigned)
[employer] wanted [place] to send forms to be filled out asking about income I printed and mailed to patient for her to do her part and get the information asked so we can finish our part and fax to [employer] for her

## 2021-10-23 ENCOUNTER — Other Ambulatory Visit: Payer: Self-pay | Admitting: Student

## 2021-10-23 DIAGNOSIS — R0789 Other chest pain: Secondary | ICD-10-CM

## 2021-10-26 ENCOUNTER — Ambulatory Visit
Admission: RE | Admit: 2021-10-26 | Discharge: 2021-10-26 | Disposition: A | Discharge status: Home / Self Care | Payer: Managed Care, Other (non HMO) | Source: Ambulatory Visit | Attending: Student | Admitting: Student

## 2021-10-26 ENCOUNTER — Other Ambulatory Visit: Payer: Self-pay

## 2021-10-26 DIAGNOSIS — R0789 Other chest pain: Secondary | ICD-10-CM | POA: Insufficient documentation

## 2021-10-29 ENCOUNTER — Telehealth: Payer: Self-pay

## 2021-10-29 NOTE — Telephone Encounter (Signed)
Patient states she has not had a flare since the last time. She states she wants to know if she has to start medication at this time. If she does she wants to know if there is a oral medication she can take instead. She states she is not comfortable giving her self a injection. Explain to patient there was a program called Humira connect where the nurse comes out to her home and shows her how to give the injection. She states she still would not be comfortable giving her self a injections

## 2021-10-29 NOTE — Telephone Encounter (Signed)
[employer] denied patient Entyvio 350m infused at 0,2,6 and then 8 weeks.   ID number [id number] ID [id number] Fax appeal to [phone]

## 2021-10-29 NOTE — Telephone Encounter (Signed)
Samantha Stephens  Please inform patient that her insurance denied Entyvio infusion.  Rather, they require her to try a different biologic.  I recommend to try Humira which is an injectable, subcutaneous medication that she will have to take every 2 weeks  Go ahead and apply for Humira, standard induction and maintenance dose if patient is agreeable  Saloni Lablanc

## 2021-10-29 NOTE — Telephone Encounter (Addendum)
Per Sharrie Rothman:  1st e-mail Denial Reason: Weyman Rodney not a preferred product. Not enough alternative therapies attempted before Entyvio.  Can you see what therapies have been tried and Ill reach out to [facility] rep  2nd email: Actually I found this below so we are starting the appeal process.    COMMENTS REGARDING DENIAL: Explained to the representative that the previous therapies listed on clinicals as Prednisone and Bentyl to which she advised that we restart the submission process. Only after asking if there was a Rn I could speak to in the review unit or if there was an appeal process was I given the fax # for the [facility] listed above.  So appeal is being done by them on the [facility]

## 2021-10-30 ENCOUNTER — Telehealth: Payer: Self-pay

## 2021-10-30 ENCOUNTER — Other Ambulatory Visit: Payer: Self-pay | Admitting: Family Medicine

## 2021-10-30 MED ORDER — FLUCONAZOLE 150 MG PO TABS: 150.0000 mg | ORAL_TABLET | ORAL | 0 refills | Status: DC

## 2021-10-30 NOTE — Telephone Encounter (Signed)
Patient notified

## 2021-10-30 NOTE — Telephone Encounter (Signed)
Per Sharrie Rothman Email Wanted see if Dr. Marius Ditch wants to do a Peer to Peer for Samantha Stephens.  Her referral is from over a month ago.  [facility] was backed up for 2 weeks for approving their patients.  They denied saying patient didnt try previous medications which she did.  When we told them that, they said we need to do an appeal.  Note below from our appeals department notes [facility] [employer]uermann can take forever.  Do you want to see if Dr. Marius Ditch wants to call for a peer to peer instead to move the process quicker?  Number is in email below if so  Peer to peer number is [phone]

## 2021-10-30 NOTE — Telephone Encounter (Signed)
Copied from [place] [phone]. Topic: General - Other >> Oct 30, 2021  9:15 AM Yvette Rack wrote: Reason for CRM: Pt requests return call from Dr. Ancil Boozer or her nurse because she needs a Rx sent in for a yeast infection. Cb# [phone]

## 2021-10-31 LAB — HM DIABETES EYE EXAM

## 2021-10-31 NOTE — Telephone Encounter (Signed)
Got Peer to peer schedule for 11/01/2021 at 4:00pm. The provider will call you to answer the questions he has for you.

## 2021-11-01 NOTE — Telephone Encounter (Signed)
Completed peer to peer.  Since patient required 2 rounds of prednisone courses to treat flareups, Entyvio will be approved  RV

## 2021-11-02 NOTE — Telephone Encounter (Signed)
Informed Samantha Stephens with [employer] infusion of this information. Tried to call patient but mailbox is full. Sent patient a Therapist, music

## 2021-11-27 NOTE — Progress Notes (Signed)
Name: Samantha Stephens   MRN: [id number]    DOB: 29-May-1961   Date:11/29/2021       Progress Note  Subjective  Chief Complaint  Follow Up  HPI  DMII: she is on diet only but not as compliant with her diet, A1C went from 7.6 % to 6.9 %and today is up again at 7.6 % , she had a Chron's flare and had to take prednisone but currently not taking prednisone and she follows a diabetic diet. She  is  not on ARB because of history of angioedema . She has dyslipidemia but unable to tolerate statins because it causes severe myalgia, she is taking Zetia,and is back on Vascepa. Did not tolerate Metformin in the past caused indigestion and diarrhea She has been on [place] since Spring 2022 She denies polyphagia, polydipsia or polyuria . We will recheck labs    Dyslipidemia: she has been taking Zetia and also back on Vascepa and is tolerating it well. We will recheck labs   False positive hepatitis C screen: with negative quantitative test back in 2016-12-20, repeated in 05/2020 but confirmatory test was not done , liver enzymes normal. Unchanged    HTN: she has been taking norvasc 7.5 mg  daily, and bp is at goal, not on ARB because angioedema. She denies chest pain or dizziness. She denies any recent episodes of palpitation . She has been walking daily , we may need to go down on Norvasc from 7.5 mg to 20m since we are starting farxiga, but to do it only the week before her next visit    Anxiety: Symptoms started after her husband died 202-Mar-2019 She is doing well now, staying active - walking daily, water aerobics,  going to church, small groups, book club.    Vitamin D and B12 : she stopped B12 since it was high, still taking vitamin D supplementation We will recheck labs    Insomnia: she has been taking Melatonin sustained release and has been able to fall and stay  asleep for about 6 hours. She used to sleep 8 hours before her husband died.    OSA: she wears CPAP every night., she states does not sleep well  without it . Wake up feeling rested , but sometimes she wakes up with a headache when she keeps mask on all night.    OA hip: [facility] [id number] pain is worse at night and feels stiff in the mornings. She started an acquatic class and had so much pain to urgent care and was given Robaxin but she never took it. We will refer her to Rheumatologist if symptoms do not improve with immuno modulator that will be given for chron's   Chron's disease: she was diagnosed in her 244's in remission from her 318'suntil recently. She was having diarrhea and weight loss, had repeat colonoscopy that showed active Chron's in the sigmoid and descending colon. She took prednisone and symptoms improved. She is now seeing Dr. VMarius Ditchand will start infusion of Entyvio next week . She is worried about weight loss, explained it may be secondary to inflammation and hopefully she will start to gain weight once inflammation goes down   Unwanted weight loss: over 10 % weight loss in the past year, she was 155 lbs in 01/22 and today down to 141 lbs. She is not trying to lose weight. She used to be in the 170 lbs range. She has normal appetite. She is worried. Discussed  high calorie diet. Mammogram and colonoscopy up to date. She has Chron's and will start therapy next week. Explained that if no weight gain we need to refer her to hematologist   Patient Active Problem List   Diagnosis Date Noted   Trochanteric bursitis of right hip 10/18/2021   Central abdominal pain 08/27/2021   CPAP use counseling 08/14/2020   Insomnia related to another mental disorder    Vitamin D deficiency 01/07/2020   Vitamin B12 deficiency 01/07/2020   Osteoarthritis of hip 11/12/2019   DDD (degenerative disc disease), cervical 04/19/2019   Hyperlipidemia LDL goal <70 12/16/2018   Diabetes mellitus ([place]) 04/22/2018   OSA on CPAP 09/15/2017   Chronic hip pain, right 09/09/2016   Numbness of right foot 09/09/2016   Scoliosis  03/08/2016   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   Situational anxiety 10/27/2015   Hypertension goal BP (blood pressure) < 130/80 08/18/2015   Insomnia, controlled 08/18/2015    Past Surgical History:  Procedure Laterality Date   ABDOMINAL HYSTERECTOMY     CESAREAN SECTION     1   COLONOSCOPY     COLONOSCOPY WITH PROPOFOL N/A 06/18/2021   Procedure: COLONOSCOPY WITH PROPOFOL;  Surgeon: Lin Landsman, MD;  Location: [facility];  Service: Gastroenterology;  Laterality: N/A;   CYSTOSCOPY W/ RETROGRADES Bilateral 11/25/2016   Procedure: CYSTOSCOPY WITH RETROGRADE PYELOGRAM;  Surgeon: Hollice Espy, MD;  Location: [facility];  Service: Urology;  Laterality: Bilateral;   EYE SURGERY     lasix eye surgery    Family History  Problem Relation Age of Onset   Arthritis Mother    COPD Mother    Depression Mother    Diabetes Mother    Hyperlipidemia Mother    Hypertension Mother    Cataracts Mother    Heart disease Mother    Hearing loss Father    Kidney disease Father    Cataracts Father    Cancer - Prostate Father        kidney and prostate cancer, colon cancer   Asthma Sister    Cancer Paternal Grandmother    Mental illness Sister    Breast cancer Other     Social History   Tobacco Use   Smoking status: Former    Types: Cigarettes    Quit date: 1990    Years since quitting: 33.1   Smokeless tobacco: Never   Tobacco comments:    but only for a short amount of time  Substance Use Topics   Alcohol use: Yes    Alcohol/week: 0.0 - 1.0 standard drinks    Comment: occasional glass of wine     Current Outpatient Medications:    amLODipine (NORVASC) 5 MG tablet, Take 1 tablet (5 mg total) by mouth daily., Disp: 90 tablet, Rfl: 1   Cholecalciferol (VITAMIN D3) 2000 units TABS, Take 1 capsule by mouth daily., Disp: , Rfl:    dapagliflozin propanediol (FARXIGA) 10 MG TABS tablet, Take 1 tablet (10 mg total) by mouth daily before breakfast., Disp: 90  tablet, Rfl: 1   dicyclomine (BENTYL) 10 MG capsule, Take 1 capsule (10 mg total) by mouth 3 (three) times daily as needed for up to 30 doses for spasms., Disp: 30 capsule, Rfl: 0   ezetimibe (ZETIA) 10 MG tablet, Take 1 tablet (10 mg total) by mouth daily., Disp: 90 tablet, Rfl: 1   fluconazole (DIFLUCAN) 150 MG tablet, Take 1 tablet (150 mg total) by mouth every other day., Disp: 3 tablet, Rfl:  0   Multiple Vitamin (MULTIVITAMIN) tablet, Take 1 tablet by mouth daily., Disp: , Rfl:    ondansetron (ZOFRAN) 4 MG tablet, Take 1 tablet (4 mg total) by mouth every 8 (eight) hours as needed for nausea or vomiting., Disp: 14 tablet, Rfl: 0   triamcinolone cream (KENALOG) 0.1 %, Apply 1 application topically 2 (two) times daily., Disp: 30 g, Rfl: 1   VASCEPA 1 g capsule, TAKE 2 CAPSULES(2 GRAMS) BY MOUTH TWICE DAILY, Disp: 360 capsule, Rfl: 1   vedolizumab (ENTYVIO) 300 MG injection, Inject 300 mg into the vein every 8 (eight) weeks., Disp: , Rfl:   Allergies  Allergen Reactions   Trazodone And Nefazodone Other (See Comments)    Syncope; see in ER Feb 2018   Benicar [Olmesartan] Cough   Statins     I personally reviewed active problem list, medication list, allergies, family history, social history, health maintenance with the patient/caregiver today.   ROS  Constitutional: Negative for fever or weight change.  Respiratory: Negative for cough and shortness of breath.   Cardiovascular: Negative for chest pain or palpitations.  Gastrointestinal: Negative for abdominal pain, no bowel changes.  Musculoskeletal: Negative for gait problem or joint swelling.  Skin: Negative for rash.  Neurological: Negative for dizziness or headache.  No other specific complaints in a complete review of systems (except as listed in HPI above).   Objective  Vitals:   11/28/21 1519  BP: 126/70  Pulse: 92  Resp: 16  SpO2: 99%  Weight: 141 lb (64 kg)  Height: 5' 4"  (1.626 m)    Body mass index is 24.2  kg/m.  Physical Exam  Constitutional: Patient appears well-developed and well-nourished. No distress.  HEENT: head atraumatic, normocephalic, pupils equal and reactive to light,  neck supple Cardiovascular: Normal rate, regular rhythm and normal heart sounds.  No murmur heard. No BLE edema. Pulmonary/Chest: Effort normal and breath sounds normal. No respiratory distress. Abdominal: Soft.  There is no tenderness. Psychiatric: Patient has a normal mood and affect. behavior is normal. Judgment and thought content normal.   Recent Results (from the past 2160 hour(s))  TSH     Status: None   Collection Time: 09/03/21  3:45 PM  Result Value Ref Range   TSH 0.865 0.450 - 4.500 uIU/mL  HM DIABETES EYE EXAM     Status: None   Collection Time: 11/28/21 12:00 AM  Result Value Ref Range   HM Diabetic Eye Exam No Retinopathy No Retinopathy     PHQ2/9: Depression screen [facility] 2/9 11/28/2021 09/03/2021 08/22/2021 05/22/2021 03/07/2021  Decreased Interest 0 0 0 0 0  Down, Depressed, Hopeless 0 0 1 0 0  PHQ - 2 Score 0 0 1 0 0  Altered sleeping 0 0 0 - 3  Tired, decreased energy 0 0 0 - 0  Change in appetite 0 0 0 - 0  Feeling bad or failure about yourself  0 0 0 - 0  Trouble concentrating 0 0 0 - 0  Moving slowly or fidgety/restless 0 0 0 - 0  Suicidal thoughts 0 0 0 - 0  PHQ-9 Score 0 0 1 - 3  Difficult doing work/chores - - - - -  Some recent data might be hidden    phq 9 is negative   Fall Risk: Fall Risk  11/28/2021 09/03/2021 08/22/2021 05/22/2021 03/07/2021  Falls in the past year? 0 0 0 0 0  Number falls in past yr: 0 0 0 0 0  Injury with Fall?  0 0 0 0 0  Risk for fall due to : No Fall Risks No Fall Risks No Fall Risks - -  Follow up Falls prevention discussed Falls prevention discussed Falls prevention discussed - -      Functional Status Survey: Is the patient deaf or have difficulty hearing?: No Does the patient have difficulty seeing, even when wearing glasses/contacts?: No Does  the patient have difficulty concentrating, remembering, or making decisions?: No Does the patient have difficulty walking or climbing stairs?: No Does the patient have difficulty dressing or bathing?: No Does the patient have difficulty doing errands alone such as visiting a doctor's office or shopping?: No    Assessment & Plan  1. Dyslipidemia associated with type 2 diabetes mellitus (HCC)  - Lipid panel - Microalbumin / creatinine urine ratio - Hemoglobin A1c  2. Low serum vitamin B12  - Vitamin B12  3. Vitamin D deficiency  - VITAMIN D 25 Hydroxy (Vit-D Deficiency, Fractures)  4. Hypertension associated with type 2 diabetes mellitus ([place])   5. Crohn's disease of large intestine without complication ([place])   6. Essential hypertension  - CBC with Differential/Platelet - Comprehensive metabolic panel  7. OSA on CPAP   8. Mild protein-calorie malnutrition ([place])   9. Routine screening for STI (sexually transmitted infection)  - RPR - HIV Antibody (routine testing w rflx)

## 2021-11-28 ENCOUNTER — Other Ambulatory Visit: Payer: Self-pay

## 2021-11-28 ENCOUNTER — Encounter: Payer: Self-pay | Admitting: Family Medicine

## 2021-11-28 ENCOUNTER — Ambulatory Visit: Payer: Managed Care, Other (non HMO) | Admitting: Family Medicine

## 2021-11-28 VITALS — BP 126/70 | HR 92 | Resp 16 | Ht 64.0 in | Wt 141.0 lb

## 2021-11-28 DIAGNOSIS — K5010 Crohn's disease of large intestine without complications: Secondary | ICD-10-CM

## 2021-11-28 DIAGNOSIS — I10 Essential (primary) hypertension: Secondary | ICD-10-CM

## 2021-11-28 DIAGNOSIS — G4733 Obstructive sleep apnea (adult) (pediatric): Secondary | ICD-10-CM

## 2021-11-28 DIAGNOSIS — Z9989 Dependence on other enabling machines and devices: Secondary | ICD-10-CM

## 2021-11-28 DIAGNOSIS — Z113 Encounter for screening for infections with a predominantly sexual mode of transmission: Secondary | ICD-10-CM

## 2021-11-28 DIAGNOSIS — E1169 Type 2 diabetes mellitus with other specified complication: Secondary | ICD-10-CM | POA: Diagnosis not present

## 2021-11-28 DIAGNOSIS — E559 Vitamin D deficiency, unspecified: Secondary | ICD-10-CM

## 2021-11-28 DIAGNOSIS — E1159 Type 2 diabetes mellitus with other circulatory complications: Secondary | ICD-10-CM | POA: Diagnosis not present

## 2021-11-28 DIAGNOSIS — E538 Deficiency of other specified B group vitamins: Secondary | ICD-10-CM

## 2021-11-28 DIAGNOSIS — E441 Mild protein-calorie malnutrition: Secondary | ICD-10-CM

## 2021-11-28 DIAGNOSIS — I152 Hypertension secondary to endocrine disorders: Secondary | ICD-10-CM

## 2021-11-28 DIAGNOSIS — E785 Hyperlipidemia, unspecified: Secondary | ICD-10-CM

## 2021-11-30 LAB — CBC WITH DIFFERENTIAL/PLATELET
Basophils Absolute: 0.1 10*3/uL (ref 0.0–0.2)
Basos: 2 %
EOS (ABSOLUTE): 0.4 10*3/uL (ref 0.0–0.4)
Eos: 7 %
Hematocrit: 43.3 % (ref 34.0–46.6)
Hemoglobin: 14.1 g/dL (ref 11.1–15.9)
Immature Grans (Abs): 0 10*3/uL (ref 0.0–0.1)
Immature Granulocytes: 0 %
Lymphocytes Absolute: 3.5 10*3/uL — ABNORMAL HIGH (ref 0.7–3.1)
Lymphs: 56 %
MCH: 24.8 pg — ABNORMAL LOW (ref 26.6–33.0)
MCHC: 32.6 g/dL (ref 31.5–35.7)
MCV: 76 fL — ABNORMAL LOW (ref 79–97)
Monocytes Absolute: 0.4 10*3/uL (ref 0.1–0.9)
Monocytes: 6 %
Neutrophils Absolute: 1.8 10*3/uL (ref 1.4–7.0)
Neutrophils: 29 %
Platelets: 308 10*3/uL (ref 150–450)
RBC: 5.68 x10E6/uL — ABNORMAL HIGH (ref 3.77–5.28)
RDW: 14.5 % (ref 11.7–15.4)
WBC: 6.2 10*3/uL (ref 3.4–10.8)

## 2021-11-30 LAB — COMPREHENSIVE METABOLIC PANEL
ALT: 24 IU/L (ref 0–32)
AST: 19 IU/L (ref 0–40)
Albumin/Globulin Ratio: 1.9 (ref 1.2–2.2)
Albumin: 5 g/dL — ABNORMAL HIGH (ref 3.8–4.8)
Alkaline Phosphatase: 93 IU/L (ref 44–121)
BUN/Creatinine Ratio: 22 (ref 12–28)
BUN: 21 mg/dL (ref 8–27)
Bilirubin Total: 0.3 mg/dL (ref 0.0–1.2)
CO2: 24 mmol/L (ref 20–29)
Calcium: 9.8 mg/dL (ref 8.7–10.3)
Chloride: 99 mmol/L (ref 96–106)
Creatinine, Ser: 0.97 mg/dL (ref 0.57–1.00)
Globulin, Total: 2.6 g/dL (ref 1.5–4.5)
Glucose: 88 mg/dL (ref 70–99)
Potassium: 4.2 mmol/L (ref 3.5–5.2)
Sodium: 140 mmol/L (ref 134–144)
Total Protein: 7.6 g/dL (ref 6.0–8.5)
eGFR: 66 mL/min/{1.73_m2} (ref >59)

## 2021-11-30 LAB — LIPID PANEL
Chol/HDL Ratio: 3.1 ratio (ref 0.0–4.4)
Cholesterol, Total: 187 mg/dL (ref 100–199)
HDL: 61 mg/dL (ref >39)
LDL Chol Calc (NIH): 97 mg/dL (ref 0–99)
Triglycerides: 171 mg/dL — ABNORMAL HIGH (ref 0–149)
VLDL Cholesterol Cal: 29 mg/dL (ref 5–40)

## 2021-11-30 LAB — HEMOGLOBIN A1C
Est. average glucose Bld gHb Est-mCnc: 151 mg/dL
Hgb A1c MFr Bld: 6.9 % — ABNORMAL HIGH (ref 4.8–5.6)

## 2021-11-30 LAB — MICROALBUMIN / CREATININE URINE RATIO
Creatinine, Urine: 27.2 mg/dL
Microalb/Creat Ratio: <11 mg/g creat (ref 0–29)
Microalbumin, Urine: <3 ug/mL

## 2021-11-30 LAB — VITAMIN B12: Vitamin B-12: 1146 pg/mL (ref 232–1245)

## 2021-11-30 LAB — VITAMIN D 25 HYDROXY (VIT D DEFICIENCY, FRACTURES): Vit D, 25-Hydroxy: 47.1 ng/mL (ref 30.0–100.0)

## 2021-12-06 LAB — RPR: RPR Ser Ql: NONREACTIVE

## 2021-12-06 LAB — HIV ANTIBODY (ROUTINE TESTING W REFLEX): HIV Screen 4th Generation wRfx: NONREACTIVE

## 2021-12-31 ENCOUNTER — Ambulatory Visit (INDEPENDENT_AMBULATORY_CARE_PROVIDER_SITE_OTHER): Payer: Managed Care, Other (non HMO) | Admitting: Internal Medicine

## 2021-12-31 VITALS — BP 105/71 | HR 71 | Resp 14 | Ht 64.0 in | Wt 140.0 lb

## 2021-12-31 DIAGNOSIS — Z7189 Other specified counseling: Secondary | ICD-10-CM

## 2021-12-31 DIAGNOSIS — Z9989 Dependence on other enabling machines and devices: Secondary | ICD-10-CM | POA: Diagnosis not present

## 2021-12-31 DIAGNOSIS — G4733 Obstructive sleep apnea (adult) (pediatric): Secondary | ICD-10-CM | POA: Diagnosis not present

## 2021-12-31 NOTE — Progress Notes (Unsigned)
[facility] [place], [place] [address]  Pulmonary Sleep Medicine   Office Visit Note  Patient Name: Samantha Stephens DOB: [birth year] MRN [id number]    Chief Complaint: Obstructive Sleep Apnea visit  Brief History:  Samantha Stephens is seen today for follow up appointment. The patient has a 15 year history of sleep apnea. Patient is using PAP nightly.  The patient feels good after sleeping with PAP.  The patient reports benefiting from PAP use and gets headaches if she doesn't use it.Marland Kitchen  Epworth Sleepiness Score is 8 out of 24. The patient seldom take naps. The patient complains of the following: no concerns  The compliance download shows  compliance with an average use time of 5:12  hours@ 78%. The AHI is 0.4  The patient does not complain of limb movements disrupting sleep.  ROS  General: (-) fever, (-) chills, (-) night sweat Nose and Sinuses: (-) nasal stuffiness or itchiness, (-) postnasal drip, (-) nosebleeds, (-) sinus trouble. Mouth and Throat: (-) sore throat, (-) hoarseness. Neck: (-) swollen glands, (-) enlarged thyroid, (-) neck pain. Respiratory: - cough, - shortness of breath, - wheezing. Neurologic: - numbness, - tingling. Psychiatric: - anxiety, - depression   Current Medication: Outpatient Encounter Medications as of 12/31/2021  Medication Sig   amLODipine (NORVASC) 5 MG tablet Take 1 tablet (5 mg total) by mouth daily.   Cholecalciferol (VITAMIN D3) 2000 units TABS Take 1 capsule by mouth daily.   dapagliflozin propanediol (FARXIGA) 10 MG TABS tablet Take 1 tablet (10 mg total) by mouth daily before breakfast.   dicyclomine (BENTYL) 10 MG capsule Take 1 capsule (10 mg total) by mouth 3 (three) times daily as needed for up to 30 doses for spasms.   ezetimibe (ZETIA) 10 MG tablet Take 1 tablet (10 mg total) by mouth daily.   fluconazole (DIFLUCAN) 150 MG tablet Take 1 tablet (150 mg total) by mouth every other day.   Multiple Vitamin  (MULTIVITAMIN) tablet Take 1 tablet by mouth daily.   ondansetron (ZOFRAN) 4 MG tablet Take 1 tablet (4 mg total) by mouth every 8 (eight) hours as needed for nausea or vomiting.   triamcinolone cream (KENALOG) 0.1 % Apply 1 application topically 2 (two) times daily.   VASCEPA 1 g capsule TAKE 2 CAPSULES(2 GRAMS) BY MOUTH TWICE DAILY   vedolizumab (ENTYVIO) 300 MG injection Inject 300 mg into the vein every 8 (eight) weeks.   No facility-administered encounter medications on file as of 12/31/2021.    Surgical History: Past Surgical History:  Procedure Laterality Date   ABDOMINAL HYSTERECTOMY     CESAREAN SECTION     1   COLONOSCOPY     COLONOSCOPY WITH PROPOFOL N/A 06/18/2021   Procedure: COLONOSCOPY WITH PROPOFOL;  Surgeon: Lin Landsman, MD;  Location[facility] END[facility]COPY;  Service: Gastroenterology;  Laterality: N/A;   CYSTOSCOPY W/ RETROGRADES Bilateral 11/25/2016   Procedure: CYSTOSCOPY WITH RETROGRADE PYELOGRAM;  Surgeon: Hollice Espy, MD;  Location: [facility] ORS;  Service: Urology;  Laterality: Bilateral;   EYE SURGERY     lasix eye surgery    Medical History: 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   Diabetes mellitus without complication (HCC)    Family hx of colon cancer requiring screening colonoscopy 03/08/2016   Headache    Hyperlipidemia    Hypertension    Hypokalemia    Insomnia related to another  mental disorder    Palpitations    Scoliosis 03/08/2016   Sleep apnea    Stress headaches     Family History: Non contributory to the present illness  Social History: Social History   Socioeconomic History   Marital status: Widowed    Spouse name: Not on file   Number of children: 1   Years of education: Not on file   Highest education level: Bachelor's degree (e.g., BA, AB, BS)  Occupational History   Occupation: IT   Tobacco Use   Smoking status: Former     Types: Cigarettes    Quit date: 1990    Years since quitting: 33.2   Smokeless tobacco: Never   Tobacco comments:    but only for a short amount of time  Vaping Use   Vaping Use: Never used  Substance and Sexual Activity   Alcohol use: Yes    Alcohol/week: 0.0 - 1.0 standard drinks    Comment: occasional glass of wine   Drug use: No   Sexual activity: Yes    Partners: Male  Other Topics Concern   Not on file  Social History Narrative   Not on file   Social Determinants of Health   Financial Resource Strain: Low Risk    Difficulty of Paying Living Expenses: Not hard at all  Food Insecurity: No Food Insecurity   Worried About Charity fundraiser in the Last Year: Never true   [place] in the Last Year: Never true  Transportation Needs: No Transportation Needs   Lack of Transportation (Medical): No   Lack of Transportation (Non-Medical): No  Physical Activity: Sufficiently Active   Days of Exercise per Week: 5 days   Minutes of Exercise per Session: 60 min  Stress: No Stress Concern Present   Feeling of Stress : Only a little  Social Connections: Moderately Integrated   Frequency of Communication with Friends and Family: More than three times a week   Frequency of Social Gatherings with Friends and Family: Once a week   Attends Religious Services: More than 4 times per year   Active Member of [employer] or Organizations: Yes   Attends Archivist Meetings: More than 4 times per year   Marital Status: Widowed  Human resources officer Violence: Not At Risk   Fear of Current or Ex-Partner: No   Emotionally Abused: No   Physically Abused: No   Sexually Abused: No    Vital Signs: Blood pressure 105/71, pulse 71, resp. rate 14, height 5' 4"  (1.626 m), weight 140 lb (63.5 kg), SpO2 99 %. Body mass index is 24.03 kg/m.    Examination: General Appearance: The patient is well-developed, well-nourished, and in no distress. Neck Circumference: 37.5cm Skin: Gross  inspection of skin unremarkable. Head: normocephalic, no gross deformities. Eyes: no gross deformities noted. ENT: ears appear grossly normal Neurologic: Alert and oriented. No involuntary movements.    EPWORTH SLEEPINESS SCALE:  Scale:  (0)= no chance of dozing; (1)= slight chance of dozing; (2)= moderate chance of dozing; (3)= high chance of dozing  Chance  Situtation    Sitting and reading: 1    Watching TV: 1    Sitting Inactive in public: 1    As a passenger in car: 1      Lying down to rest: 1    Sitting and talking: 1    Sitting quielty after lunch: 1    In a car, stopped in traffic: 1   TOTAL SCORE:  8 out of 24    SLEEP STUDIES:  PSG 04/14/07  -   AHI 10 SpO60mn 84%   CPAP COMPLIANCE DATA:  Date Range: 12/28/20 - 12/27/21  Average Daily Use: 5:12  hours  Median Use: 5:25  Compliance for > 4 Hours: 78%  AHI: 0.4 respiratory events per hour  Days Used: 360/365  Mask Leak: 16.9 lpm  95th Percentile Pressure: 11.6 cmH2O    LABS: Recent Results (from the past 2160 hour(s))  HM DIABETES EYE EXAM     Status: None   Collection Time: 10/31/21 12:00 AM  Result Value Ref Range   HM Diabetic Eye Exam No Retinopathy No Retinopathy  CBC with Differential/Platelet     Status: Abnormal   Collection Time: 11/29/21 11:53 AM  Result Value Ref Range   WBC 6.2 3.4 - 10.8 x10E3/uL   RBC 5.68 (H) 3.77 - 5.28 x10E6/uL   Hemoglobin 14.1 11.1 - 15.9 g/dL   Hematocrit 43.3 34.0 - 46.6 %   MCV 76 (L) 79 - 97 fL   MCH 24.8 (L) 26.6 - 33.0 pg   MCHC 32.6 31.5 - 35.7 g/dL   RDW 14.5 11.7 - 15.4 %   Platelets 308 150 - 450 x10E3/uL   Neutrophils 29 Not Estab. %   Lymphs 56 Not Estab. %   Monocytes 6 Not Estab. %   Eos 7 Not Estab. %   Basos 2 Not Estab. %   Neutrophils Absolute 1.8 1.4 - 7.0 x10E3/uL   Lymphocytes Absolute 3.5 (H) 0.7 - 3.1 x10E3/uL   Monocytes Absolute 0.4 0.1 - 0.9 x10E3/uL   EOS (ABSOLUTE) 0.4 0.0 - 0.4 x10E3/uL   Basophils Absolute  0.1 0.0 - 0.2 x10E3/uL   Immature Granulocytes 0 Not Estab. %   Immature Grans (Abs) 0.0 0.0 - 0.1 x10E3/uL  Comprehensive metabolic panel     Status: Abnormal   Collection Time: 11/29/21 11:53 AM  Result Value Ref Range   Glucose 88 70 - 99 mg/dL   BUN 21 8 - 27 mg/dL   Creatinine, Ser 0.97 0.57 - 1.00 mg/dL   eGFR 66 >59 mL/min/1.73   BUN/Creatinine Ratio 22 12 - 28   Sodium 140 134 - 144 mmol/L   Potassium 4.2 3.5 - 5.2 mmol/L   Chloride 99 96 - 106 mmol/L   CO2 24 20 - 29 mmol/L   Calcium 9.8 8.7 - 10.3 mg/dL   Total Protein 7.6 6.0 - 8.5 g/dL   Albumin 5.0 (H) 3.8 - 4.8 g/dL   Globulin, Total 2.6 1.5 - 4.5 g/dL   Albumin/Globulin Ratio 1.9 1.2 - 2.2   Bilirubin Total 0.3 0.0 - 1.2 mg/dL   Alkaline Phosphatase 93 44 - 121 IU/L   AST 19 0 - 40 IU/L   ALT 24 0 - 32 IU/L  Lipid panel     Status: Abnormal   Collection Time: 11/29/21 11:53 AM  Result Value Ref Range   Cholesterol, Total 187 100 - 199 mg/dL   Triglycerides 171 (H) 0 - 149 mg/dL   HDL 61 >39 mg/dL   VLDL Cholesterol Cal 29 5 - 40 mg/dL   LDL Chol Calc (NIH) 97 0 - 99 mg/dL   Chol/HDL Ratio 3.1 0.0 - 4.4 ratio    Comment:                                   T. Chol/HDL Ratio  Men  Women                               1/2 Avg.Risk  3.4    3.3                                   Avg.Risk  5.0    4.4                                2X Avg.Risk  9.6    7.1                                3X Avg.Risk 23.4   11.0   Microalbumin / creatinine urine ratio     Status: None   Collection Time: 11/29/21 11:53 AM  Result Value Ref Range   Creatinine, Urine 27.2 Not Estab. mg/dL   Microalbumin, Urine <3.0 Not Estab. ug/mL   Microalb/Creat Ratio <11 0 - 29 mg/g creat    Comment:                        Normal:                0 -  29                        Moderately increased: 30 - 300                        Severely increased:       >300   Vitamin B12     Status: None   Collection  Time: 11/29/21 11:53 AM  Result Value Ref Range   Vitamin B-12 1,146 232 - 1,245 pg/mL  VITAMIN D 25 Hydroxy (Vit-D Deficiency, Fractures)     Status: None   Collection Time: 11/29/21 11:53 AM  Result Value Ref Range   Vit D, 25-Hydroxy 47.1 30.0 - 100.0 ng/mL    Comment: Vitamin D deficiency has been defined by the [place] and an Endocrine Society practice guideline as a level of serum 25-OH vitamin D less than 20 ng/mL (1,2). The Endocrine Society went on to further define vitamin D insufficiency as a level between 21 and 29 ng/mL (2). 1. IOM (Institute of Medicine). 2010. Dietary reference    intakes for calcium and D. [place]: The    [employer]. 2. Holick MF, Binkley Trosky, Bischoff-Ferrari HA, et al.    Evaluation, treatment, and prevention of vitamin D    deficiency: an Endocrine Society clinical practice    guideline. JCEM. 2011 Jul; 96(7):1911-30.   Hemoglobin A1c     Status: Abnormal   Collection Time: 11/29/21 11:53 AM  Result Value Ref Range   Hgb A1c MFr Bld 6.9 (H) 4.8 - 5.6 %    Comment:          Prediabetes: 5.7 - 6.4          Diabetes: >6.4          Glycemic control for adults with diabetes: <7.0    Est. average glucose Bld gHb Est-mCnc 151 mg/dL  RPR     Status: None   Collection Time:  12/05/21 11:03 AM  Result Value Ref Range   RPR Ser Ql Non Reactive Non Reactive  HIV Antibody (routine testing w rflx)     Status: None   Collection Time: 12/05/21 11:03 AM  Result Value Ref Range   HIV Screen 4th Generation wRfx Non Reactive Non Reactive    Comment: HIV Negative HIV-1/HIV-2 antibodies and HIV-1 p24 antigen were NOT detected. There is no laboratory evidence of HIV infection.     Radiology: CT CARDIAC SCORING (SELF PAY ONLY)  Addendum Date: 10/26/2021   ADDENDUM REPORT: 10/26/2021 17:45 CLINICAL DATA:  Cardiovascular Disease Risk stratification EXAM: Coronary Calcium Score TECHNIQUE: A gated, non-contrast computed tomography  scan of the heart was performed using 42m slice thickness. Axial images were analyzed on a dedicated workstation. Calcium scoring of the coronary arteries was performed using the Agatston method. FINDINGS: Coronary arteries: Normal origins. Coronary Calcium Score: Total: 0 Percentile: 0 Pericardium: Normal. Ascending Aorta: Normal caliber. Non-cardiac: [facility]. IMPRESSION: Coronary calcium score of 0. RECOMMENDATIONS: Coronary artery calcium (CAC) score is a strong predictor of incident coronary heart disease (CHD) and provides predictive information beyond traditional risk factors. CAC scoring is reasonable to use in the decision to withhold, postpone, or initiate statin therapy in intermediate-risk or selected borderline-risk asymptomatic adults (age 62-75years and LDL-C >=70 to <190 mg/dL) who do not have diabetes or established atherosclerotic cardiovascular disease (ASCVD).* In intermediate-risk (10-year ASCVD risk >=7.5% to <20%) adults or selected borderline-risk (10-year ASCVD risk >=5% to <7.5%) adults in whom a CAC score is measured for the purpose of making a treatment decision the following recommendations have been made: If CAC=0, it is reasonable to withhold statin therapy and reassess in 5 to 10 years, as long as higher risk conditions are absent (diabetes mellitus, family history of premature CHD in first degree relatives (males <55 years; females <65 years), cigarette smoking, or LDL >=190 mg/dL). If CAC is 1 to 99, it is reasonable to initiate statin therapy for patients >=586years of age. If CAC is >=100 or >=75th percentile, it is reasonable to initiate statin therapy at any age. Cardiology referral should be considered for patients with CAC scores >=400 or >=75th percentile. *2018 AHA/ACC/AACVPR/AAPA/ABC/ACPM/ADA/AGS/APhA/ASPC/NLA/PCNA Guideline on the Management of Blood Cholesterol: A Report of the American College of Cardiology/American Heart Association  Task Force on Clinical Practice Guidelines. J Am Coll Cardiol. 2019;73(24):3168-3209. Electronically Signed   By: MCandee FurbishM.D.   On: 10/26/2021 17:45   Result Date: 10/26/2021 EXAM: OVER-READ INTERPRETATION  CT CHEST The following report is an over-read performed by radiologist Dr. GAletta Edouardof [facility], PMoncureon 10/26/2021. This over-read does not include interpretation of cardiac or coronary anatomy or pathology. The coronary calcium score interpretation by the cardiologist is attached. COMPARISON:  None. FINDINGS: Vascular: No significant noncardiac vascular findings. Mediastinum/Nodes: Visualized mediastinum and hilar regions demonstrate no lymphadenopathy or masses. Lungs/Pleura: Visualized lungs show no evidence of pulmonary edema, consolidation, pneumothorax, nodule or pleural fluid. Upper Abdomen: No acute abnormality. Musculoskeletal: No chest wall mass or suspicious bone lesions identified. IMPRESSION: No significant incidental findings. Electronically Signed: By: GAletta EdouardM.D. On: 10/26/2021 16:48    No results found.  No results found.    Assessment and Plan: Patient Active Problem List   Diagnosis Date Noted   Trochanteric bursitis of right hip 10/18/2021   Central abdominal pain 08/27/2021   CPAP use counseling 08/14/2020   Insomnia related to another mental disorder    Vitamin  D deficiency 01/07/2020   Vitamin B12 deficiency 01/07/2020   Osteoarthritis of hip 11/12/2019   DDD (degenerative disc disease), cervical 04/19/2019   Hyperlipidemia LDL goal <70 12/16/2018   Diabetes mellitus ([place]) 04/22/2018   OSA on CPAP 09/15/2017   Chronic hip pain, right 09/09/2016   Numbness of right foot 09/09/2016   Scoliosis 03/08/2016   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 OSA on CPAP The patient does tolerate PAP  and reports  benefit from PAP use. The patient was reminded how to clean equipment and advised to replace supplies routinely. The patient was also counselled on sleeping longer. The compliance is very good. The AHI is 0.4.   OSA- continue with good compliance with pap. F/u one year.    2. CPAP use counseling CPAP Counseling: had a lengthy discussion with the patient regarding the importance of PAP therapy in management of the sleep apnea. Patient appears to understand the risk factor reduction and also understands the risks associated with untreated sleep apnea. Patient will try to make a good faith effort to remain compliant with therapy. Also instructed the patient on proper cleaning of the device including the water must be changed daily if possible and use of distilled water is preferred. Patient understands that the machine should be regularly cleaned with appropriate recommended cleaning solutions that do not damage the PAP machine for example given white vinegar and water rinses. Other methods such as ozone treatment may not be as good as these simple methods to achieve cleaning.    General Counseling: I have discussed the findings of the evaluation and examination with Zainab.  I have also discussed any further diagnostic evaluation thatmay be needed or ordered today. Latroya verbalizes understanding of the findings of todays visit. We also reviewed her medications today and discussed drug interactions and side effects including but not limited excessive drowsiness and altered mental states. We also discussed that there is always a risk not just to her but also people around her. she has been encouraged to call the office with any questions or concerns that should arise related to todays visit.  No orders of the defined types were placed in this encounter.       I have personally obtained a history, examined the patient, evaluated laboratory and imaging results, formulated the assessment and  plan and placed orders. This patient was seen today by Tressie Ellis, PA-C in collaboration with Dr. Devona Konig.   Allyne Gee, MD [facility] Diplomate ABMS Pulmonary Critical Care Medicine and Sleep Medicine

## 2021-12-31 NOTE — Patient Instructions (Signed)

## 2022-02-11 ENCOUNTER — Other Ambulatory Visit: Payer: Self-pay | Admitting: Family Medicine

## 2022-02-11 DIAGNOSIS — E1169 Type 2 diabetes mellitus with other specified complication: Secondary | ICD-10-CM

## 2022-02-19 ENCOUNTER — Ambulatory Visit
Admission: RE | Admit: 2022-02-19 | Discharge: 2022-02-19 | Disposition: A | Discharge status: Home / Self Care | Payer: Managed Care, Other (non HMO) | Source: Ambulatory Visit | Attending: Gastroenterology | Admitting: Gastroenterology

## 2022-02-19 DIAGNOSIS — K5010 Crohn's disease of large intestine without complications: Secondary | ICD-10-CM

## 2022-02-20 ENCOUNTER — Ambulatory Visit (INDEPENDENT_AMBULATORY_CARE_PROVIDER_SITE_OTHER): Payer: Managed Care, Other (non HMO) | Admitting: Family Medicine

## 2022-02-20 ENCOUNTER — Encounter: Payer: Self-pay | Admitting: Family Medicine

## 2022-02-20 VITALS — BP 112/70 | HR 90 | Temp 98.0°F | Resp 14 | Ht 64.0 in | Wt 146.8 lb

## 2022-02-20 DIAGNOSIS — K5010 Crohn's disease of large intestine without complications: Secondary | ICD-10-CM | POA: Insufficient documentation

## 2022-02-20 DIAGNOSIS — I152 Hypertension secondary to endocrine disorders: Secondary | ICD-10-CM

## 2022-02-20 DIAGNOSIS — E1169 Type 2 diabetes mellitus with other specified complication: Secondary | ICD-10-CM | POA: Diagnosis not present

## 2022-02-20 DIAGNOSIS — E559 Vitamin D deficiency, unspecified: Secondary | ICD-10-CM

## 2022-02-20 DIAGNOSIS — I10 Essential (primary) hypertension: Secondary | ICD-10-CM

## 2022-02-20 DIAGNOSIS — E1159 Type 2 diabetes mellitus with other circulatory complications: Secondary | ICD-10-CM | POA: Diagnosis not present

## 2022-02-20 DIAGNOSIS — Z79899 Other long term (current) drug therapy: Secondary | ICD-10-CM

## 2022-02-20 DIAGNOSIS — E785 Hyperlipidemia, unspecified: Secondary | ICD-10-CM

## 2022-02-20 DIAGNOSIS — E538 Deficiency of other specified B group vitamins: Secondary | ICD-10-CM

## 2022-02-20 DIAGNOSIS — G4733 Obstructive sleep apnea (adult) (pediatric): Secondary | ICD-10-CM

## 2022-02-20 DIAGNOSIS — Z9989 Dependence on other enabling machines and devices: Secondary | ICD-10-CM

## 2022-02-20 DIAGNOSIS — E441 Mild protein-calorie malnutrition: Secondary | ICD-10-CM

## 2022-02-20 NOTE — Patient Instructions (Signed)
Research Praluent and Repatha  ?

## 2022-02-20 NOTE — Progress Notes (Signed)
Name: Samantha Stephens   MRN: [id number]    DOB: [birth year]   Date:02/20/2022 ? ?     Progress Note ? ?Subjective ? ?Chief Complaint ? ?Chief Complaint  ?Patient presents with  ? Follow-up  ? ? ?HPI ? ?DMII: she is on diet only but not as compliant with her diet, A1C went from 7.6 % to 6.9 % ,7.6 % and last visit down to 6.9 % again , she had a Chron's flare and had to take prednisone but currently not taking prednisone and she follows a diabetic diet. She  is  not on ARB because of history of angioedema . She has dyslipidemia but unable to tolerate statins because it causes severe myalgia, she is taking Zetia,and is back on Vascepa, LDL not at goal but we will continue current regiment since she is not interested in using another injectable.Did not tolerate Metformin in the past caused indigestion and diarrhea She has been on [place] since Spring 2022 She denies polyphagia, polydipsia or polyuria . We will recheck labs  ?  ?Dyslipidemia: she has been taking Zetia and also back on Vascepa and is tolerating it well. Reviewed last labs  ?  ?HTN: she has been taking norvasc 7.5 mg  daily, and bp is at goal, not on ARB because angioedema. She denies chest pain or dizziness. She denies any recent episodes of palpitation . She has been walking daily , we may need to go down on Norvasc from 7.5 mg to 49m since we are starting farxiga, but to do it only the week before her next visit  ?  ?Anxiety: Symptoms started after her husband died 2Mar 14, 2019 She is doing well now, staying active - walking daily, water aerobics,  going to church, small groups, book club. She is starting to date again  ?  ?Vitamin D and B12 : she stopped B12 since it was high, still taking vitamin D supplementation We will recheck labs  ?  ?Insomnia: she takes sleepy time tea and has been sleeping 6 to 6.5 hours and feels rested, no longer taking melatonin  ?  ?OSA: she wears CPAP every night., she states does not sleep well without it . Wake up feeling rested  , but sometimes she wakes up with a headache when she keeps mask on all night. She is sleeping about 6-6.5 hours per night  ?  ?[facility] pain is worse at night and feels stiff in the mornings. She is back to water aerobics and has been walking again. Feeling better  ? ?Chron's disease: she was diagnosed in her 21's in remission for about 30 years. She was having diarrhea and weight loss, had repeat colonoscopy that showed active Chron's in the sigmoid and descending colon August 2022. She took prednisone and symptoms improved. However due abdominal cramping, diarrhea  and weight loss she started on Entyvio mid F03/14/2023, she is feeling much better, hair is growing back, bowel movements back to normal, weight is going up.   ? ?Unwanted weight loss: over 10 % weight loss in the past year, she was 155 lbs in 01/22 was down to 141 lbs 003-14-2023. [facility] gained 6.8 lbs since she had first infusion  mid FMar 13, 2024 She is feeling much better.  ? ?Patient Active Problem List  ? Diagnosis Date Noted  ? Crohn's disease of large intestine without complication ([place] 064/68/0321 ? Trochanteric bursitis  of right hip 10/18/2021  ? CPAP use counseling 08/14/2020  ? Insomnia related to another mental disorder   ? Vitamin D deficiency 01/07/2020  ? Vitamin B12 deficiency 01/07/2020  ? Osteoarthritis of hip 11/12/2019  ? DDD (degenerative disc disease), cervical 04/19/2019  ? Hyperlipidemia LDL goal <70 12/16/2018  ? Diabetes mellitus ([place]) 04/22/2018  ? OSA on CPAP 09/15/2017  ? Chronic hip pain, right 09/09/2016  ? Numbness of right foot 09/09/2016  ? Scoliosis 03/08/2016  ? 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 Situational anxiety 10/27/2015  ? Hypertension goal BP (blood pressure) < 130/80 08/18/2015  ? Insomnia, controlled 08/18/2015  ? ? ?Past Surgical History:  ?Procedure Laterality Date  ? ABDOMINAL HYSTERECTOMY    ? CESAREAN SECTION    ? 1   ? COLONOSCOPY    ? COLONOSCOPY WITH PROPOFOL N/A 06/18/2021  ? Procedure: COLONOSCOPY WITH PROPOFOL;  Surgeon: Lin Landsman, MD;  Location: [facility] ENDOSCOPY;  Service: Gastroenterology;  Laterality: N/A;  ? CYSTOSCOPY W/ RETROGRADES Bilateral 11/25/2016  ? Procedure: CYSTOSCOPY WITH RETROGRADE PYELOGRAM;  Surgeon: Hollice Espy, MD;  Location: [facility] ORS;  Service: Urology;  Laterality: Bilateral;  ? EYE SURGERY    ? lasix eye surgery  ? ? ?Family History  ?Problem Relation Age of Onset  ? Arthritis Mother   ? COPD Mother   ? Depression Mother   ? Diabetes Mother   ? Hyperlipidemia Mother   ? Hypertension Mother   ? Cataracts Mother   ? Heart disease Mother   ? Hearing loss Father   ? Kidney disease Father   ? Cataracts Father   ? Cancer - Prostate Father   ?     kidney and prostate cancer, colon cancer  ? Asthma Sister   ? Cancer Paternal Grandmother   ? Mental illness Sister   ? Breast cancer Other   ? ? ?Social History  ? ?Tobacco Use  ? Smoking status: Former  ?  Types: Cigarettes  ?  Quit date: 57  ?  Years since quitting: 33.3  ? Smokeless tobacco: Never  ? Tobacco comments:  ?  but only for a short amount of time  ?Substance Use Topics  ? Alcohol use: Yes  ?  Alcohol/week: 0.0 - 1.0 standard drinks  ?  Comment: occasional glass of wine  ? ? ? ?Current Outpatient Medications:  ?  amLODipine (NORVASC) 5 MG tablet, Take 1 tablet (5 mg total) by mouth daily., Disp: 90 tablet, Rfl: 1 ?  Cholecalciferol (VITAMIN D3) 2000 units TABS, Take 1 capsule by mouth daily., Disp: , Rfl:  ?  dapagliflozin propanediol (FARXIGA) 10 MG TABS tablet, Take 1 tablet (10 mg total) by mouth daily before breakfast., Disp: 90 tablet, Rfl: 1 ?  ezetimibe (ZETIA) 10 MG tablet, Take 1 tablet (10 mg total) by mouth daily., Disp: 90 tablet, Rfl: 1 ?  Multiple Vitamin (MULTIVITAMIN) tablet, Take 1 tablet by mouth daily., Disp: , Rfl:  ?  triamcinolone cream (KENALOG) 0.1 %, Apply 1 application topically 2 (two) times daily., Disp: 30 g,  Rfl: 1 ?  VASCEPA 1 g capsule, TAKE 2 CAPSULES(2 GRAMS) BY MOUTH TWICE DAILY, Disp: 360 capsule, Rfl: 1 ?  vedolizumab (ENTYVIO) 300 MG injection, Inject 300 mg into the vein every 8 (eight) weeks., Disp: , Rfl:  ? ?Allergies  ?Allergen Reactions  ? Trazodone And Nefazodone Other (See Comments)  ?  Syncope; see in ER Feb 2018  ? Benicar [Olmesartan] Cough  ? Statins   ? ? ?  I personally reviewed active problem list, medication list, allergies, family history, social history with the patient/caregiver today. ? ? ?ROS ? ?Constitutional: Negative for fever or weight change.  ?Respiratory: Negative for cough and shortness of breath.   ?Cardiovascular: Negative for chest pain or palpitations.  ?Gastrointestinal: Negative for abdominal pain, no bowel changes.  ?Musculoskeletal: Negative for gait problem or joint swelling.  ?Skin: Negative for rash.  ?Neurological: Negative for dizziness or headache.  ?No other specific complaints in a complete review of systems (except as listed in HPI above).  ? ?Objective ? ?Vitals:  ? 02/20/22 1534  ?BP: 112/70  ?Pulse: 90  ?Resp: 14  ?Temp: 98 ?F (36.7 ?C)  ?TempSrc: Oral  ?SpO2: 98%  ?Weight: 146 lb 12.8 oz (66.6 kg)  ?Height: 5' 4"  (1.626 m)  ? ? ?Body mass index is 25.2 kg/m?. ? ?Physical Exam ? ?Constitutional: Patient appears well-developed and well-nourished.  No distress.  ?HEENT: head atraumatic, normocephalic, pupils equal and reactive to light, neck supple ?Cardiovascular: Normal rate, regular rhythm and normal heart sounds.  No murmur heard. No BLE edema. ?Pulmonary/Chest: Effort normal and breath sounds normal. No respiratory distress. ?Abdominal: Soft.  There is no tenderness. ?Psychiatric: Patient has a normal mood and affect. behavior is normal. Judgment and thought content normal.  ? ?Recent Results (from the past 2160 hour(s))  ?CBC with Differential/Platelet     Status: Abnormal  ? Collection Time: 11/29/21 11:53 AM  ?Result Value Ref Range  ? WBC 6.2 3.4 - 10.8  x10E3/uL  ? RBC 5.68 (H) 3.77 - 5.28 x10E6/uL  ? Hemoglobin 14.1 11.1 - 15.9 g/dL  ? Hematocrit 43.3 34.0 - 46.6 %  ? MCV 76 (L) 79 - 97 fL  ? MCH 24.8 (L) 26.6 - 33.0 pg  ? MCHC 32.6 31.5 - 35.7 g/dL  ? RDW

## 2022-02-27 ENCOUNTER — Encounter: Payer: Self-pay | Admitting: Gastroenterology

## 2022-02-27 DIAGNOSIS — M545 Low back pain, unspecified: Secondary | ICD-10-CM

## 2022-02-28 ENCOUNTER — Ambulatory Visit
Admission: RE | Admit: 2022-02-28 | Discharge: 2022-02-28 | Disposition: A | Discharge status: Home / Self Care | Payer: Managed Care, Other (non HMO) | Source: Ambulatory Visit | Attending: Gastroenterology | Admitting: Gastroenterology

## 2022-02-28 ENCOUNTER — Other Ambulatory Visit: Payer: Self-pay | Admitting: Gastroenterology

## 2022-02-28 ENCOUNTER — Ambulatory Visit
Admission: RE | Admit: 2022-02-28 | Discharge: 2022-02-28 | Disposition: A | Discharge status: Home / Self Care | Payer: Managed Care, Other (non HMO) | Attending: Gastroenterology | Admitting: Gastroenterology

## 2022-02-28 ENCOUNTER — Telehealth: Payer: Self-pay

## 2022-02-28 DIAGNOSIS — M545 Low back pain, unspecified: Secondary | ICD-10-CM

## 2022-02-28 NOTE — Telephone Encounter (Signed)
Samantha Stephens from radiology is calling about the Xray of her pelvis she states that there protocol is only Xray of pelvis 1-2 views. She asked if she could cancel the Pelvis complete. Informed her yes  ?

## 2022-03-04 ENCOUNTER — Telehealth: Payer: Self-pay

## 2022-03-04 NOTE — Telephone Encounter (Signed)
Called patient and patient verbalized understanding  ?

## 2022-03-04 NOTE — Telephone Encounter (Signed)
-----   Message from Lin Landsman, MD sent at 03/03/2022 11:59 PM EDT ----- ?Caryl Pina ? ?Please inform patient that she has degenerative changes of both hips ? ?Dr. Ancil Boozer ? ?Do you think she would benefit from referral to orthopedics? ? ?Thanks ?Rohini Vanga ?

## 2022-03-08 ENCOUNTER — Other Ambulatory Visit: Payer: Self-pay | Admitting: Family Medicine

## 2022-03-08 DIAGNOSIS — E1169 Type 2 diabetes mellitus with other specified complication: Secondary | ICD-10-CM

## 2022-03-08 DIAGNOSIS — I152 Hypertension secondary to endocrine disorders: Secondary | ICD-10-CM

## 2022-03-27 ENCOUNTER — Other Ambulatory Visit: Payer: Self-pay | Admitting: Family Medicine

## 2022-03-27 DIAGNOSIS — I10 Essential (primary) hypertension: Secondary | ICD-10-CM

## 2022-03-27 NOTE — Telephone Encounter (Signed)
Requested Prescriptions  Pending Prescriptions Disp Refills  . amLODipine (NORVASC) 5 MG tablet [Pharmacy Med Name: AMLODIPINE BESYLATE 5MG TABLETS] 90 tablet 1    Sig: TAKE 1 TABLET(5 MG) BY MOUTH DAILY     Cardiovascular: Calcium Channel Blockers 2 Passed - 03/27/2022  4:31 PM      Passed - Last BP in normal range    BP Readings from Last 1 Encounters:  02/20/22 112/70         Passed - Last Heart Rate in normal range    Pulse Readings from Last 1 Encounters:  02/20/22 90         Passed - Valid encounter within last 6 months    Recent Outpatient Visits          1 month ago Crohn's disease of large intestine without complication [facility])   [facility], Drue Stager, MD   3 months ago Dyslipidemia associated with type 2 diabetes mellitus [facility])   [facility] Steele Sizer, MD   6 months ago Memory changes   [facility] [place], Drue Stager, MD   7 months ago Type 2 diabetes mellitus with other specified complication, without long-term current use of insulin [facility])   [facility] Steele Sizer, MD   10 months ago Well adult exam   [facility] Steele Sizer, MD      Future Appointments            In 3 months Ancil Boozer, Drue Stager, MD [facility], [facility]

## 2022-04-01 DIAGNOSIS — G3184 Mild cognitive impairment, so stated: Secondary | ICD-10-CM | POA: Insufficient documentation

## 2022-05-04 LAB — COMPREHENSIVE METABOLIC PANEL
ALT: 20 IU/L (ref 0–32)
AST: 22 IU/L (ref 0–40)
Albumin/Globulin Ratio: 1.9 (ref 1.2–2.2)
Albumin: 4.7 g/dL (ref 3.9–4.9)
Alkaline Phosphatase: 80 IU/L (ref 44–121)
BUN/Creatinine Ratio: 15 (ref 12–28)
BUN: 13 mg/dL (ref 8–27)
Bilirubin Total: 0.5 mg/dL (ref 0.0–1.2)
CO2: 26 mmol/L (ref 20–29)
Calcium: 9.6 mg/dL (ref 8.7–10.3)
Chloride: 102 mmol/L (ref 96–106)
Creatinine, Ser: 0.86 mg/dL (ref 0.57–1.00)
Globulin, Total: 2.5 g/dL (ref 1.5–4.5)
Glucose: 111 mg/dL — ABNORMAL HIGH (ref 70–99)
Potassium: 4.1 mmol/L (ref 3.5–5.2)
Sodium: 140 mmol/L (ref 134–144)
Total Protein: 7.2 g/dL (ref 6.0–8.5)
eGFR: 77 mL/min/{1.73_m2} (ref >59)

## 2022-05-04 LAB — CBC WITH DIFFERENTIAL/PLATELET
Basophils Absolute: 0.1 10*3/uL (ref 0.0–0.2)
Basos: 1 %
EOS (ABSOLUTE): 0.2 10*3/uL (ref 0.0–0.4)
Eos: 3 %
Hematocrit: 41.2 % (ref 34.0–46.6)
Hemoglobin: 14 g/dL (ref 11.1–15.9)
Immature Grans (Abs): 0 10*3/uL (ref 0.0–0.1)
Immature Granulocytes: 0 %
Lymphocytes Absolute: 2.9 10*3/uL (ref 0.7–3.1)
Lymphs: 43 %
MCH: 25.7 pg — ABNORMAL LOW (ref 26.6–33.0)
MCHC: 34 g/dL (ref 31.5–35.7)
MCV: 76 fL — ABNORMAL LOW (ref 79–97)
Monocytes Absolute: 0.4 10*3/uL (ref 0.1–0.9)
Monocytes: 6 %
Neutrophils Absolute: 3.2 10*3/uL (ref 1.4–7.0)
Neutrophils: 47 %
Platelets: 291 10*3/uL (ref 150–450)
RBC: 5.44 x10E6/uL — ABNORMAL HIGH (ref 3.77–5.28)
RDW: 15.5 % — ABNORMAL HIGH (ref 11.7–15.4)
WBC: 6.7 10*3/uL (ref 3.4–10.8)

## 2022-05-04 LAB — LIPID PANEL
Chol/HDL Ratio: 2.5 ratio (ref 0.0–4.4)
Cholesterol, Total: 197 mg/dL (ref 100–199)
HDL: 78 mg/dL (ref >39)
LDL Chol Calc (NIH): 108 mg/dL — ABNORMAL HIGH (ref 0–99)
Triglycerides: 60 mg/dL (ref 0–149)
VLDL Cholesterol Cal: 11 mg/dL (ref 5–40)

## 2022-05-04 LAB — SEDIMENTATION RATE: Sed Rate: 8 mm/hr (ref 0–40)

## 2022-05-04 LAB — HEMOGLOBIN A1C
Est. average glucose Bld gHb Est-mCnc: 151 mg/dL
Hgb A1c MFr Bld: 6.9 % — ABNORMAL HIGH (ref 4.8–5.6)

## 2022-05-04 LAB — C-REACTIVE PROTEIN: CRP: 1 mg/L (ref 0–10)

## 2022-05-04 LAB — VITAMIN B12: Vitamin B-12: 1453 pg/mL — ABNORMAL HIGH (ref 232–1245)

## 2022-05-04 LAB — VITAMIN D 25 HYDROXY (VIT D DEFICIENCY, FRACTURES): Vit D, 25-Hydroxy: 42.2 ng/mL (ref 30.0–100.0)

## 2022-05-05 ENCOUNTER — Encounter: Payer: Self-pay | Admitting: Family Medicine

## 2022-05-06 ENCOUNTER — Encounter: Payer: Self-pay | Admitting: Family Medicine

## 2022-05-06 DIAGNOSIS — D573 Sickle-cell trait: Secondary | ICD-10-CM | POA: Insufficient documentation

## 2022-05-09 ENCOUNTER — Other Ambulatory Visit: Payer: Self-pay | Admitting: Family Medicine

## 2022-05-09 DIAGNOSIS — E785 Hyperlipidemia, unspecified: Secondary | ICD-10-CM

## 2022-05-09 DIAGNOSIS — E1169 Type 2 diabetes mellitus with other specified complication: Secondary | ICD-10-CM

## 2022-05-21 ENCOUNTER — Telehealth: Payer: Self-pay

## 2022-05-21 NOTE — Telephone Encounter (Signed)
Patient verbalized understanding of instructions  

## 2022-05-21 NOTE — Telephone Encounter (Signed)
Does not appear to be related to [facility].  If she is exercising and walking, and the weather is really warm, can lead to cramps.  Advise her adequate hydration, Gatorade, electrolytes  RV

## 2022-05-21 NOTE — Telephone Encounter (Signed)
Patient states she does walk a lot and takes exercise classes. She states last night she had a left leg cramp last night that was really bad that woke her up from in sleep. She states when she went to bed she felt like she had pulled something in her left arm but did not think that much about it. She states the cramp really scared her because she has not had one that was that bad. She took a magnesium pill, drunk a Gatorade and rub her left leg. She states it eventually went away and she went back to sleep. She states that she has some soreness in that leg this morning but is walking normal on it. She is on Entyvio and had a infusion last week. Wants to make sure there is no side effect going on.

## 2022-05-23 NOTE — Progress Notes (Signed)
Name: Samantha Stephens   MRN: [id number]    DOB: 03-31-1961   Date:05/24/2022       Progress Note  Subjective  Chief Complaint  Leg Cramps  HPI  Leg Cramps: patient states over the past couple of months she has noticed some muscle aches during the day , either on forearm or calves and at night she wakes up with a severe cramp on that specific muscle. The pain is intense. She needs to get up to massage her muscles. The episodes are getting more frequent, now almost every night. There is only on new medication Vedolizumab but she is not sure if the symptoms started before she started medications. She contacted GI and was advised to follow up with PCP - unlikely due to medication. She is physically active, drinks plenty of fluids, has been stretching at night. Takes epson salt baths in the evenings, tried pickle juice but nothing is helping. She denies paresthesias or weakness.   Recent labs reviewed.   Patient Active Problem List   Diagnosis Date Noted   Sickle cell trait ([place]) 05/06/2022   Mild cognitive impairment 04/01/2022   Crohn's disease of large intestine without complication ([place]) 53/64/6803   Trochanteric bursitis of right hip 10/18/2021   CPAP use counseling 08/14/2020   Insomnia related to another mental disorder    Vitamin D deficiency 01/07/2020   Vitamin B12 deficiency 01/07/2020   Osteoarthritis of hip 11/12/2019   DDD (degenerative disc disease), cervical 04/19/2019   Hyperlipidemia LDL goal <70 12/16/2018   Occipital headache 09/01/2018   Diabetes mellitus ([place]) 04/22/2018   OSA on CPAP 09/15/2017   Chronic hip pain, right 09/09/2016   Numbness of right foot 09/09/2016   Scoliosis 03/08/2016   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   Situational anxiety 10/27/2015   Hypertension goal BP (blood pressure) < 130/80 08/18/2015   Insomnia, controlled 08/18/2015    Past Surgical History:  Procedure Laterality Date   ABDOMINAL HYSTERECTOMY     CESAREAN  SECTION     1   COLONOSCOPY     COLONOSCOPY WITH PROPOFOL N/A 06/18/2021   Procedure: COLONOSCOPY WITH PROPOFOL;  Surgeon: Lin Landsman, MD;  Location: [facility] ENDOSCOPY;  Service: Gastroenterology;  Laterality: N/A;   CYSTOSCOPY W/ RETROGRADES Bilateral 11/25/2016   Procedure: CYSTOSCOPY WITH RETROGRADE PYELOGRAM;  Surgeon: Hollice Espy, MD;  Location: [facility] ORS;  Service: Urology;  Laterality: Bilateral;   EYE SURGERY     lasix eye surgery    Family History  Problem Relation Age of Onset   Arthritis Mother    COPD Mother    Depression Mother    Diabetes Mother    Hyperlipidemia Mother    Hypertension Mother    Cataracts Mother    Heart disease Mother    Hearing loss Father    Kidney disease Father    Cataracts Father    Cancer - Prostate Father        kidney and prostate cancer, colon cancer   Asthma Sister    Cancer Paternal Grandmother    Mental illness Sister    Breast cancer Other     Social History   Tobacco Use   Smoking status: Former    Types: Cigarettes    Quit date: 1990    Years since quitting: 33.6   Smokeless tobacco: Never   Tobacco comments:    but only for a short amount of time  Substance Use Topics   Alcohol use: Yes    Alcohol/week:  0.0 - 1.0 standard drinks of alcohol    Comment: occasional glass of wine     Current Outpatient Medications:    amLODipine (NORVASC) 5 MG tablet, TAKE 1 TABLET(5 MG) BY MOUTH DAILY, Disp: 90 tablet, Rfl: 1   Cholecalciferol (VITAMIN D3) 2000 units TABS, Take 1 capsule by mouth daily., Disp: , Rfl:    ezetimibe (ZETIA) 10 MG tablet, TAKE 1 TABLET(10 MG) BY MOUTH DAILY FOR CHOLESTEROL, Disp: 90 tablet, Rfl: 1   FARXIGA 10 MG TABS tablet, TAKE 1 TABLET(10 MG) BY MOUTH DAILY BEFORE AND BREAKFAST, Disp: 90 tablet, Rfl: 1   Multiple Vitamin (MULTIVITAMIN) tablet, Take 1 tablet by mouth daily., Disp: , Rfl:    triamcinolone cream (KENALOG) 0.1 %, Apply 1 application topically 2 (two) times daily., Disp: 30 g, Rfl:  1   VASCEPA 1 g capsule, TAKE 2 CAPSULES(2 GRAMS) BY MOUTH TWICE DAILY, Disp: 360 capsule, Rfl: 1   vedolizumab (ENTYVIO) 300 MG injection, Inject 300 mg into the vein every 8 (eight) weeks., Disp: , Rfl:   Allergies  Allergen Reactions   Trazodone And Nefazodone Other (See Comments)    Syncope; see in ER Feb 2018   Benicar [Olmesartan] Cough   Statins     I personally reviewed active problem list, medication list, allergies, family history, social history, health maintenance with the patient/caregiver today.   ROS  Ten systems reviewed and is negative except as mentioned in HPI   Objective  Vitals:   05/24/22 1427  BP: 124/72  Pulse: 91  Resp: 16  SpO2: 98%  Weight: 145 lb (65.8 kg)  Height: _0  (1.626 m)    Body mass index is 24.89 kg/m.  Physical Exam  Constitutional: Patient appears well-developed and well-nourished.  No distress.  HEENT: head atraumatic, normocephalic, pupils equal and reactive to light, neck supple Cardiovascular: Normal rate, regular rhythm and normal heart sounds.  No murmur heard. No BLE edema. Pulmonary/Chest: Effort normal and breath sounds normal. No respiratory distress. Abdominal: Soft.  There is no tenderness. Muscular skeletal: no abnormal findings.  Psychiatric: Patient has a normal mood and affect. behavior is normal. Judgment and thought content normal.   Recent Results (from the past 2160 hour(s))  Lipid panel     Status: Abnormal   Collection Time: 05/03/22 10:41 AM  Result Value Ref Range   Cholesterol, Total 197 100 - 199 mg/dL   Triglycerides 60 0 - 149 mg/dL   HDL 78 >39 mg/dL   VLDL Cholesterol Cal 11 5 - 40 mg/dL   LDL Chol Calc (NIH) 108 (H) 0 - 99 mg/dL   Chol/HDL Ratio 2.5 0.0 - 4.4 ratio    Comment:                                   T. Chol/HDL Ratio                                             Men  Women                               1/2 Avg.Risk  3.4    3.3  Avg.Risk  5.0     4.4                                2X Avg.Risk  9.6    7.1                                3X Avg.Risk 23.4   11.0   Comprehensive metabolic panel     Status: Abnormal   Collection Time: 05/03/22 10:41 AM  Result Value Ref Range   Glucose 111 (H) 70 - 99 mg/dL   BUN 13 8 - 27 mg/dL   Creatinine, Ser 0.86 0.57 - 1.00 mg/dL   eGFR 77 >59 mL/min/1.73   BUN/Creatinine Ratio 15 12 - 28   Sodium 140 134 - 144 mmol/L   Potassium 4.1 3.5 - 5.2 mmol/L   Chloride 102 96 - 106 mmol/L   CO2 26 20 - 29 mmol/L   Calcium 9.6 8.7 - 10.3 mg/dL   Total Protein 7.2 6.0 - 8.5 g/dL   Albumin 4.7 3.9 - 4.9 g/dL    Comment:               **Please note reference interval change**   Globulin, Total 2.5 1.5 - 4.5 g/dL   Albumin/Globulin Ratio 1.9 1.2 - 2.2   Bilirubin Total 0.5 0.0 - 1.2 mg/dL   Alkaline Phosphatase 80 44 - 121 IU/L   AST 22 0 - 40 IU/L   ALT 20 0 - 32 IU/L  CBC with Differential/Platelet     Status: Abnormal   Collection Time: 05/03/22 10:41 AM  Result Value Ref Range   WBC 6.7 3.4 - 10.8 x10E3/uL   RBC 5.44 (H) 3.77 - 5.28 x10E6/uL   Hemoglobin 14.0 11.1 - 15.9 g/dL   Hematocrit 41.2 34.0 - 46.6 %   MCV 76 (L) 79 - 97 fL   MCH 25.7 (L) 26.6 - 33.0 pg   MCHC 34.0 31.5 - 35.7 g/dL   RDW 15.5 (H) 11.7 - 15.4 %   Platelets 291 150 - 450 x10E3/uL   Neutrophils 47 Not Estab. %   Lymphs 43 Not Estab. %   Monocytes 6 Not Estab. %   Eos 3 Not Estab. %   Basos 1 Not Estab. %   Neutrophils Absolute 3.2 1.4 - 7.0 x10E3/uL   Lymphocytes Absolute 2.9 0.7 - 3.1 x10E3/uL   Monocytes Absolute 0.4 0.1 - 0.9 x10E3/uL   EOS (ABSOLUTE) 0.2 0.0 - 0.4 x10E3/uL   Basophils Absolute 0.1 0.0 - 0.2 x10E3/uL   Immature Granulocytes 0 Not Estab. %   Immature Grans (Abs) 0.0 0.0 - 0.1 x10E3/uL  Hemoglobin A1c     Status: Abnormal   Collection Time: 05/03/22 10:41 AM  Result Value Ref Range   Hgb A1c MFr Bld 6.9 (H) 4.8 - 5.6 %    Comment:          Prediabetes: 5.7 - 6.4          Diabetes: >6.4           Glycemic control for adults with diabetes: <7.0    Est. average glucose Bld gHb Est-mCnc 151 mg/dL  Vitamin B12     Status: Abnormal   Collection Time: 05/03/22 10:41 AM  Result Value Ref Range   Vitamin B-12 1,453 (H) 232 - 1,245 pg/mL  VITAMIN D 25 Hydroxy (Vit-D  Deficiency, Fractures)     Status: None   Collection Time: 05/03/22 10:41 AM  Result Value Ref Range   Vit D, 25-Hydroxy 42.2 30.0 - 100.0 ng/mL    Comment: Vitamin D deficiency has been defined by the [place] practice guideline as a level of serum 25-OH vitamin D less than 20 ng/mL (1,2). The Endocrine Society went on to further define vitamin D insufficiency as a level between 21 and 29 ng/mL (2). 1. IOM (Institute of Medicine). 2010. Dietary reference    intakes for calcium and D. [place]: The    [employer]. 2. Holick MF, B[place]ley [place], Bischoff-Ferrari HA, et al.    Evaluation, treatment, and prevention of vitamin D    deficiency: an Endocrine Society clinical practice    guideline. JCEM. 2011 Jul; 96(7):1911-30.   Sedimentation rate     Status: None   Collection Time: 05/03/22 10:41 AM  Result Value Ref Range   Sed Rate 8 0 - 40 mm/hr  C-reactive protein     Status: None   Collection Time: 05/03/22 10:41 AM  Result Value Ref Range   CRP 1 0 - 10 mg/L    PHQ2/9:    05/24/2022    2:27 PM 11/28/2021    3:19 PM 09/03/2021    2:06 PM 08/22/2021    3:23 PM 05/22/2021    3:18 PM  Depression screen PHQ 2/9  Decreased Interest 0 0 0 0 0  Down, Depressed, Hopeless 0 0 0 1 0  PHQ - 2 Score 0 0 0 1 0  Altered sleeping  0 0 0   Tired, decreased energy  0 0 0   Change in appetite  0 0 0   Feeling bad or failure about yourself   0 0 0   Trouble concentrating  0 0 0   Moving slowly or fidgety/restless  0 0 0   Suicidal thoughts  0 0 0   PHQ-9 Score  0 0 1     phq 9 is negative   Fall Risk:    05/24/2022    2:27 PM 02/20/2022    3:33 PM 11/28/2021    3:19 PM  09/03/2021    2:06 PM 08/22/2021    3:23 PM  Fall Risk   Falls in the past year? 0 0 0 0 0  Number falls in past yr: 0  0 0 0  Injury with Fall? 0  0 0 0  Risk for fall due to : _0   Follow up _1       Functional Status Survey: Is the patient deaf or have difficulty hearing?: No Does the patient have difficulty seeing, even when wearing glasses/contacts?: No Does the patient have difficulty concentrating, remembering, or making decisions?: No Does the patient have difficulty walking or climbing stairs?: No Does the patient have difficulty dressing or bathing?: No Does the patient have difficulty doing errands alone such as visiting a doctor's office or shopping?: No    Assessment & Plan  1. Nocturnal muscle cramps  - Ferritin - Magnesium - baclofen (LIORESAL) 10 MG tablet; Take 1-2 tablets (10-20 mg total) by mouth at bedtime.  Dispense: 60 each; Refill: 0   Explained that if everything is normal, she can try holding zetia but if symptoms persists we will refer  her to rheumatologist or PMR.

## 2022-05-24 ENCOUNTER — Encounter: Payer: Self-pay | Admitting: Family Medicine

## 2022-05-24 ENCOUNTER — Ambulatory Visit (INDEPENDENT_AMBULATORY_CARE_PROVIDER_SITE_OTHER): Payer: Managed Care, Other (non HMO) | Admitting: Family Medicine

## 2022-05-24 VITALS — BP 124/72 | HR 91 | Resp 16 | Ht 64.0 in | Wt 145.0 lb

## 2022-05-24 DIAGNOSIS — R252 Cramp and spasm: Secondary | ICD-10-CM

## 2022-05-24 MED ORDER — BACLOFEN 10 MG PO TABS: 10.0000 mg | ORAL_TABLET | Freq: Every day | ORAL | 0 refills | Status: DC

## 2022-05-25 LAB — MAGNESIUM: Magnesium: 2.6 mg/dL — ABNORMAL HIGH (ref 1.6–2.3)

## 2022-05-25 LAB — FERRITIN: Ferritin: 98 ng/mL (ref 15–150)

## 2022-05-26 ENCOUNTER — Encounter: Payer: Self-pay | Admitting: Family Medicine

## 2022-05-27 ENCOUNTER — Other Ambulatory Visit: Payer: Self-pay | Admitting: Family Medicine

## 2022-05-28 ENCOUNTER — Other Ambulatory Visit: Payer: Self-pay

## 2022-05-29 LAB — PHOSPHORUS: Phosphorus: 4.1 mg/dL (ref 3.0–4.3)

## 2022-05-29 LAB — MAGNESIUM: Magnesium: 2.6 mg/dL — ABNORMAL HIGH (ref 1.6–2.3)

## 2022-06-07 ENCOUNTER — Ambulatory Visit: Payer: Self-pay | Admitting: *Deleted

## 2022-06-07 ENCOUNTER — Other Ambulatory Visit: Payer: Self-pay | Admitting: Family Medicine

## 2022-06-07 DIAGNOSIS — B3731 Acute candidiasis of vulva and vagina: Secondary | ICD-10-CM

## 2022-06-07 MED ORDER — FLUCONAZOLE 150 MG PO TABS
150.0000 mg | ORAL_TABLET | ORAL | 0 refills | Status: DC
Start: 2022-06-07 — End: 2022-06-26

## 2022-06-07 NOTE — Telephone Encounter (Signed)
Summary: Yeast Infection needs meds   Patient called in stating she has a yeast infection and would like something called into her pharmacy to help with that. Her pharmacy is   [id number] - [place], [place] - [address] AT [address]  Phone: [phone]  Fax: [phone]      Answer Assessment - Initial Assessment Questions 1. SYMPTOM: "What's the main symptom you're concerned about?" (e.g., pain, itching, dryness)     itching 2. LOCATION: "Where is the  itching located?" (e.g., inside/outside, left/right)     Vulvar itching 3. ONSET: "When did the itching start?"     few 4. PAIN: "Is there any pain?" If Yes, ask: "How bad is it?" (Scale: 1-10; mild, moderate, severe)   -  MILD (1-3): Doesn't interfere with normal activities.    -  MODERATE (4-7): Interferes with normal activities (e.g., work or school) or awakens from sleep.     -  SEVERE (8-10): Excruciating pain, unable to do any normal activities.     *No Answer* 5. ITCHING: "Is there any itching?" If Yes, ask: "How bad is it?" (Scale: 1-10; mild, moderate, severe)     *No Answer* 6. CAUSE: "What do you think is causing the discharge?" "Have you had the same problem before? What happened then?"     Medication- Farxiga  7. OTHER SYMPTOMS: "Do you have any other symptoms?" (e.g., fever, itching, vaginal bleeding, pain with urination, injury to genital area, vaginal foreign body)     Itching- dry skin 8. PREGNANCY: "Is there any chance you are pregnant?" "When was your last menstrual period?"     *No Answer*  Protocols used: Vaginal Symptoms-A-AH

## 2022-06-07 NOTE — Telephone Encounter (Signed)
  Chief Complaint: itching- yeast- medication request Symptoms: vulvar itching, dryness Frequency:   Pertinent Negatives: Patient denies  discharge Disposition: [] ED /[] Urgent Care (no appt availability in office) / [] Appointment(In office/virtual)/ []  [facility]/ [] Home Care/ [] Refused Recommended Disposition /[] [facility]/ []  Follow-up with PCP Additional Notes: Patient is requesting medication- she is at work and starting conference call- she does not want triage. Patient states PCP told her yeast was common with [place].

## 2022-06-07 NOTE — Telephone Encounter (Signed)
Called and left vm letting patient know rx has been sent and to return if symptoms do not resolve.

## 2022-06-11 ENCOUNTER — Emergency Department: Payer: Managed Care, Other (non HMO)

## 2022-06-11 ENCOUNTER — Emergency Department
Admission: EM | Admit: 2022-06-11 | Discharge: 2022-06-11 | Disposition: A | Discharge status: Home / Self Care | Payer: Managed Care, Other (non HMO) | Attending: Emergency Medicine | Admitting: Emergency Medicine

## 2022-06-11 ENCOUNTER — Other Ambulatory Visit: Payer: Self-pay

## 2022-06-11 DIAGNOSIS — S40022A Contusion of left upper arm, initial encounter: Secondary | ICD-10-CM | POA: Diagnosis not present

## 2022-06-11 DIAGNOSIS — X58XXXA Exposure to other specified factors, initial encounter: Secondary | ICD-10-CM | POA: Insufficient documentation

## 2022-06-11 DIAGNOSIS — S4990XA Unspecified injury of shoulder and upper arm, unspecified arm, initial encounter: Secondary | ICD-10-CM

## 2022-06-11 DIAGNOSIS — S4992XA Unspecified injury of left shoulder and upper arm, initial encounter: Secondary | ICD-10-CM | POA: Diagnosis present

## 2022-06-11 LAB — CBC WITH DIFFERENTIAL/PLATELET
Abs Immature Granulocytes: 0.02 10*3/uL (ref 0.00–0.07)
Basophils Absolute: 0.1 10*3/uL (ref 0.0–0.1)
Basophils Relative: 1 %
Eosinophils Absolute: 0.4 10*3/uL (ref 0.0–0.5)
Eosinophils Relative: 6 %
HCT: 41 % (ref 36.0–46.0)
Hemoglobin: 14 g/dL (ref 12.0–15.0)
Immature Granulocytes: 0 %
Lymphocytes Relative: 41 %
Lymphs Abs: 2.8 10*3/uL (ref 0.7–4.0)
MCH: 25.5 pg — ABNORMAL LOW (ref 26.0–34.0)
MCHC: 34.1 g/dL (ref 30.0–36.0)
MCV: 74.8 fL — ABNORMAL LOW (ref 80.0–100.0)
Monocytes Absolute: 0.3 10*3/uL (ref 0.1–1.0)
Monocytes Relative: 5 %
Neutro Abs: 3.1 10*3/uL (ref 1.7–7.7)
Neutrophils Relative %: 47 %
Platelets: 300 10*3/uL (ref 150–400)
RBC: 5.48 MIL/uL — ABNORMAL HIGH (ref 3.87–5.11)
RDW: 14.5 % (ref 11.5–15.5)
WBC: 6.8 10*3/uL (ref 4.0–10.5)
nRBC: 0 % (ref 0.0–0.2)

## 2022-06-11 LAB — COMPREHENSIVE METABOLIC PANEL
ALT: 20 U/L (ref 0–44)
AST: 30 U/L (ref 15–41)
Albumin: 4.4 g/dL (ref 3.5–5.0)
Alkaline Phosphatase: 81 U/L (ref 38–126)
Anion gap: 7 (ref 5–15)
BUN: 20 mg/dL (ref 8–23)
CO2: 25 mmol/L (ref 22–32)
Calcium: 9.2 mg/dL (ref 8.9–10.3)
Chloride: 108 mmol/L (ref 98–111)
Creatinine, Ser: 0.95 mg/dL (ref 0.44–1.00)
GFR, Estimated: >60 mL/min (ref >60)
Glucose, Bld: 228 mg/dL — ABNORMAL HIGH (ref 70–99)
Potassium: 3.5 mmol/L (ref 3.5–5.1)
Sodium: 140 mmol/L (ref 135–145)
Total Bilirubin: 0.6 mg/dL (ref 0.3–1.2)
Total Protein: 7.5 g/dL (ref 6.5–8.1)

## 2022-06-11 LAB — TROPONIN I (HIGH SENSITIVITY): Troponin I (High Sensitivity): 3 ng/L (ref <18)

## 2022-06-11 NOTE — ED Provider Notes (Signed)
[facility] Provider Note  Patient Contact: 10:01 PM (approximate)   History   Arm Injury   HPI  Samantha Stephens is a 61 y.o. female presents to the emergency department with concern for bruise along left lateral humerus that appeared after patient had venous access from the affected side.  Patient has not had any erythema or significant pain.  She states that she has had no restrictions in her range of motion.  She does not recall any falls or mechanisms of trauma.  She was recently diagnosed with hyperkalemia and wanted to be assessed.      Physical Exam   Triage Vital Signs: ED Triage Vitals  Enc Vitals Group     BP 06/11/22 1931 (!) 145/90     Pulse Rate 06/11/22 1931 84     Resp 06/11/22 1931 16     Temp 06/11/22 1931 98.2 F (36.8 C)     Temp Source 06/11/22 1931 Oral     SpO2 06/11/22 1931 95 %     Weight 06/11/22 1936 145 lb (65.8 kg)     Height 06/11/22 1936 5' 4"  (1.626 m)     Head Circumference --      Peak Flow --      Pain Score 06/11/22 1936 0     Pain Loc --      Pain Edu? --      Excl. in [place]? --     Most recent vital signs: Vitals:   06/11/22 1931  BP: (!) 145/90  Pulse: 84  Resp: 16  Temp: 98.2 F (36.8 C)  SpO2: 95%     General: Alert and in no acute distress. Eyes:  PERRL. EOMI. Head: No acute traumatic findings ENT:      Nose: No congestion/rhinnorhea.      Mouth/Throat: Mucous membranes are moist. Neck: No stridor. No cervical spine tenderness to palpation. Cardiovascular:  Good peripheral perfusion Respiratory: Normal respiratory effort without tachypnea or retractions. Lungs CTAB. Good air entry to the bases with no decreased or absent breath sounds. Gastrointestinal: Bowel sounds 4 quadrants. Soft and nontender to palpation. No guarding or rigidity. No palpable masses. No distention. No CVA tenderness. Musculoskeletal: Full range of motion to all extremities.  Neurologic:  No gross focal neurologic  deficits are appreciated.  Skin: Patient has a 3 cm x 3 cm bruise along the left lateral humerus. Other:   ED Results / Procedures / Treatments   Labs (all labs ordered are listed, but only abnormal results are displayed) Labs Reviewed  CBC WITH DIFFERENTIAL/PLATELET - Abnormal; Notable for the following components:      Result Value   RBC 5.48 (*)    MCV 74.8 (*)    MCH 25.5 (*)    All other components within normal limits  COMPREHENSIVE METABOLIC PANEL - Abnormal; Notable for the following components:   Glucose, Bld 228 (*)    All other components within normal limits  TROPONIN I (HIGH SENSITIVITY)  TROPONIN I (HIGH SENSITIVITY)          PROCEDURES:  Critical Care performed: No  Procedures   MEDICATIONS ORDERED IN ED: Medications - No data to display   IMPRESSION / MDM / [place] / ED COURSE  I reviewed the triage vital signs and the nursing notes.                              Assessment  and plan Hematoma 61 year old female presents to the emergency department with a hematoma along left lateral humerus that appeared after patient had venipuncture.  Vital signs are reassuring at triage.  On exam, patient was alert, active and nontoxic-appearing.  CMP indicated normal potassium level.  Hemoglobin and hematocrit reassuring with normal platelets.  On exam, skin was warm without erythema or edema.  Suspect hematoma secondary to venipuncture  Clinical Course as of 06/11/22 2201  Tue Jun 11, 2022  2153 Hemoglobin: 14.0 [JW]    Clinical Course User Index [JW] Lannie Fields, PA-C     FINAL CLINICAL IMPRESSION(S) / ED DIAGNOSES   Final diagnoses:  Injury of upper extremity, unspecified laterality, initial encounter     Rx / DC Orders   ED Discharge Orders     None        Note:  This document was prepared using Dragon voice recognition software and may include unintentional dictation errors.   Vallarie Mare [place], Hershal Coria 06/11/22  2203    Blake Divine, MD 06/11/22 2242

## 2022-06-11 NOTE — ED Triage Notes (Signed)
Ambulatory to triage with c/o joint pain and bruising to left upper arm. Bruise appeared suddenly and without injury today. Has been following with MD for joint pain sx.  +CMS to extremity, full ROM. Pt states it isnt necessarily painful, but area at bruise is sore.  Pt also states she feels like her heart has been fluttering, but has hx of same and was recently told had abnormal potassium level.  Denies CP or sob or need for chest xray

## 2022-06-11 NOTE — Discharge Instructions (Signed)
You can apply ice to hematoma and gently massage.

## 2022-06-25 NOTE — Progress Notes (Unsigned)
Name: Samantha Stephens   MRN: [id number]    DOB: 01/27/1961   Date:06/26/2022       Progress Note  Subjective  Chief Complaint  Follow Up  HPI  Leg Cramps: improved significantly since she stopped taking Zetia, she is still stretching before bed time and drinking Gatorade zero. She is concerned about what to take for cholesterol since she cannot tolerate statins or zetia, she is taking Vascepa already, we will try Nexletol. Discussed if side effects to contact us back and we will try PCSK9 inhibitors.   Insomnia: she used to take Ambien years ago, she is able to fall asleep but not able to stay asleep. She is worried about weight loss and would like to try taking something for that. Discussed off label indication for Remeron and she is willing to try it   Diabetes type II with dyslipidemia: last A1C 6.9 % , she has noticed frequent yeast infection, on SGL-2 agonist we will send diflucan to take prn   Patient Active Problem List   Diagnosis Date Noted   Sickle cell trait ([place]) 05/06/2022   Mild cognitive impairment 04/01/2022   Crohn's disease of large intestine without complication ([place]) 79/39/0300   Trochanteric bursitis of right hip 10/18/2021   CPAP use counseling 08/14/2020   Insomnia related to another mental disorder    Vitamin D deficiency 01/07/2020   Vitamin B12 deficiency 01/07/2020   Osteoarthritis of hip 11/12/2019   DDD (degenerative disc disease), cervical 04/19/2019   Hyperlipidemia LDL goal <70 12/16/2018   Occipital headache 09/01/2018   Diabetes mellitus ([place]) 04/22/2018   OSA on CPAP 09/15/2017   Chronic hip pain, right 09/09/2016   Numbness of right foot 09/09/2016   Scoliosis 03/08/2016   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   Situational anxiety 10/27/2015   Hypertension goal BP (blood pressure) < 130/80 08/18/2015   Insomnia, controlled 08/18/2015    Past Surgical History:  Procedure Laterality Date   ABDOMINAL HYSTERECTOMY     CESAREAN  SECTION     1   COLONOSCOPY     COLONOSCOPY WITH PROPOFOL N/A 06/18/2021   Procedure: COLONOSCOPY WITH PROPOFOL;  Surgeon: Lin Landsman, MD;  Location: [facility] ENDOSCOPY;  Service: Gastroenterology;  Laterality: N/A;   CYSTOSCOPY W/ RETROGRADES Bilateral 11/25/2016   Procedure: CYSTOSCOPY WITH RETROGRADE PYELOGRAM;  Surgeon: Hollice Espy, MD;  Location: [facility] ORS;  Service: Urology;  Laterality: Bilateral;   EYE SURGERY     lasix eye surgery    Family History  Problem Relation Age of Onset   Arthritis Mother    COPD Mother    Depression Mother    Diabetes Mother    Hyperlipidemia Mother    Hypertension Mother    Cataracts Mother    Heart disease Mother    Hearing loss Father    Kidney disease Father    Cataracts Father    Cancer - Prostate Father        kidney and prostate cancer, colon cancer   Asthma Sister    Cancer Paternal Grandmother    Mental illness Sister    Breast cancer Other     Social History   Tobacco Use   Smoking status: Former    Types: Cigarettes    Quit date: 1990    Years since quitting: 33.7   Smokeless tobacco: Never   Tobacco comments:    but only for a short amount of time  Substance Use Topics   Alcohol use: Yes  Alcohol/week: 0.0 - 1.0 standard drinks of alcohol    Comment: occasional glass of wine     Current Outpatient Medications:    amLODipine (NORVASC) 5 MG tablet, TAKE 1 TABLET(5 MG) BY MOUTH DAILY, Disp: 90 tablet, Rfl: 1   Bempedoic Acid (NEXLETOL) 180 MG TABS, Take 1 tablet by mouth daily at 2 PM., Disp: 30 tablet, Rfl: 2   Cholecalciferol (VITAMIN D3) 2000 units TABS, Take 1 capsule by mouth daily., Disp: , Rfl:    FARXIGA 10 MG TABS tablet, TAKE 1 TABLET(10 MG) BY MOUTH DAILY BEFORE AND BREAKFAST, Disp: 90 tablet, Rfl: 1   mirtazapine (REMERON) 7.5 MG tablet, Take 1-2 tablets (7.5-15 mg total) by mouth at bedtime., Disp: 60 tablet, Rfl: 0   Multiple Vitamin (MULTIVITAMIN) tablet, Take 1 tablet by mouth daily., Disp: ,  Rfl:    triamcinolone cream (KENALOG) 0.1 %, Apply 1 application topically 2 (two) times daily., Disp: 30 g, Rfl: 1   VASCEPA 1 g capsule, TAKE 2 CAPSULES(2 GRAMS) BY MOUTH TWICE DAILY, Disp: 360 capsule, Rfl: 1   vedolizumab (ENTYVIO) 300 MG injection, Inject 300 mg into the vein every 8 (eight) weeks., Disp: , Rfl:    fluconazole (DIFLUCAN) 150 MG tablet, Take 1 tablet (150 mg total) by mouth every other day. For three doses weekly prn, Disp: 12 tablet, Rfl: 0  Allergies  Allergen Reactions   Trazodone And Nefazodone Other (See Comments)    Syncope; see in ER Feb 2018   Benicar [Olmesartan] Cough   Statins     I personally reviewed active problem list, medication list, allergies, family history, social history, health maintenance with the patient/caregiver today.   ROS  Ten systems reviewed and is negative except as mentioned in HPI   Objective  Vitals:   06/26/22 1431  BP: 112/72  Pulse: 94  Resp: 14  Temp: 97.7 F (36.5 C)  TempSrc: Oral  SpO2: 97%  Weight: 143 lb 6.4 oz (65 kg)  Height: 5' 4"  (1.626 m)    Body mass index is 24.61 kg/m.  Physical Exam  Constitutional: Patient appears well-developed and well-nourished.  No distress.  HEENT: head atraumatic, normocephalic, pupils equal and reactive to light,  neck supple Cardiovascular: Normal rate, regular rhythm and normal heart sounds.  No murmur heard. No BLE edema. Pulmonary/Chest: Effort normal and breath sounds normal. No respiratory distress. Abdominal: Soft.  There is no tenderness. Psychiatric: Patient has a normal mood and affect. behavior is normal. Judgment and thought content normal.    Diabetic Foot Exam: Diabetic Foot Exam - Simple   Simple Foot Form Visual Inspection No deformities, no ulcerations, no other skin breakdown bilaterally: Yes Sensation Testing Intact to touch and monofilament testing bilaterally: Yes Pulse Check Posterior Tibialis and Dorsalis pulse intact bilaterally:  Yes Comments      PHQ2/9:    06/26/2022    2:30 PM 05/24/2022    2:27 PM 11/28/2021    3:19 PM 09/03/2021    2:06 PM 08/22/2021    3:23 PM  Depression screen PHQ 2/9  Decreased Interest 0 0 0 0 0  Down, Depressed, Hopeless 0 0 0 0 1  PHQ - 2 Score 0 0 0 0 1  Altered sleeping 0  0 0 0  Tired, decreased energy 0  0 0 0  Change in appetite 0  0 0 0  Feeling bad or failure about yourself  0  0 0 0  Trouble concentrating 0  0 0 0  Moving slowly  or fidgety/restless 0  0 0 0  Suicidal thoughts 0  0 0 0  PHQ-9 Score 0  0 0 1    phq 9 is negative   Fall Risk:    06/26/2022    2:30 PM 05/24/2022    2:27 PM 02/20/2022    3:33 PM 11/28/2021    3:19 PM 09/03/2021    2:06 PM  Fall Risk   Falls in the past year? 0 0 0 0 0  Number falls in past yr:  0  0 0  Injury with Fall?  0  0 0  Risk for fall due to :  No Fall Risks No Fall Risks No Fall Risks No Fall Risks  Follow up Falls prevention discussed;Education provided;Falls evaluation completed Falls prevention discussed Falls prevention discussed Falls prevention discussed Falls prevention discussed      Functional Status Survey: Is the patient deaf or have difficulty hearing?: Yes Does the patient have difficulty seeing, even when wearing glasses/contacts?: No Does the patient have difficulty concentrating, remembering, or making decisions?: No Does the patient have difficulty walking or climbing stairs?: No Does the patient have difficulty dressing or bathing?: No Does the patient have difficulty doing errands alone such as visiting a doctor's office or shopping?: No    Assessment & Plan  1. Dyslipidemia associated with type 2 diabetes mellitus (HCC)  - HM Diabetes Foot Exam - Bempedoic Acid (NEXLETOL) 180 MG TABS; Take 1 tablet by mouth daily at 2 PM.  Dispense: 30 tablet; Refill: 2  2. Yeast vaginitis  - fluconazole (DIFLUCAN) 150 MG tablet; Take 1 tablet (150 mg total) by mouth every other day. For three doses weekly prn   Dispense: 12 tablet; Refill: 0  3. Chronic insomnia   We will try remeron to increase appetite

## 2022-06-26 ENCOUNTER — Ambulatory Visit (INDEPENDENT_AMBULATORY_CARE_PROVIDER_SITE_OTHER): Payer: Managed Care, Other (non HMO) | Admitting: Family Medicine

## 2022-06-26 ENCOUNTER — Encounter: Payer: Self-pay | Admitting: Family Medicine

## 2022-06-26 VITALS — BP 112/72 | HR 94 | Temp 97.7°F | Resp 14 | Ht 64.0 in | Wt 143.4 lb

## 2022-06-26 DIAGNOSIS — E785 Hyperlipidemia, unspecified: Secondary | ICD-10-CM | POA: Diagnosis not present

## 2022-06-26 DIAGNOSIS — F5104 Psychophysiologic insomnia: Secondary | ICD-10-CM

## 2022-06-26 DIAGNOSIS — B3731 Acute candidiasis of vulva and vagina: Secondary | ICD-10-CM

## 2022-06-26 DIAGNOSIS — E1169 Type 2 diabetes mellitus with other specified complication: Secondary | ICD-10-CM | POA: Diagnosis not present

## 2022-06-26 MED ORDER — NEXLETOL 180 MG PO TABS: 1.0000 | ORAL_TABLET | Freq: Every day | ORAL | 2 refills | Status: DC

## 2022-06-26 MED ORDER — MIRTAZAPINE 7.5 MG PO TABS: 7.5000 mg | ORAL_TABLET | Freq: Every day | ORAL | 0 refills | Status: DC

## 2022-06-26 MED ORDER — FLUCONAZOLE 150 MG PO TABS: 150.0000 mg | ORAL_TABLET | ORAL | 0 refills | Status: DC

## 2022-06-27 ENCOUNTER — Telehealth: Payer: Self-pay

## 2022-06-27 NOTE — Telephone Encounter (Signed)
Mary with [facility] calling to verify pt.'s lipid panel and medical diagnosis.

## 2022-07-29 ENCOUNTER — Other Ambulatory Visit: Payer: Self-pay | Admitting: Family Medicine

## 2022-08-06 ENCOUNTER — Other Ambulatory Visit: Payer: Self-pay | Admitting: Family Medicine

## 2022-08-06 DIAGNOSIS — I10 Essential (primary) hypertension: Secondary | ICD-10-CM

## 2022-08-25 ENCOUNTER — Other Ambulatory Visit: Payer: Self-pay | Admitting: Family Medicine

## 2022-08-25 DIAGNOSIS — E1169 Type 2 diabetes mellitus with other specified complication: Secondary | ICD-10-CM

## 2022-08-27 ENCOUNTER — Ambulatory Visit: Payer: Managed Care, Other (non HMO) | Admitting: Family Medicine

## 2022-09-02 ENCOUNTER — Other Ambulatory Visit: Payer: Self-pay | Admitting: Family Medicine

## 2022-09-02 DIAGNOSIS — E1159 Type 2 diabetes mellitus with other circulatory complications: Secondary | ICD-10-CM

## 2022-09-02 DIAGNOSIS — E1169 Type 2 diabetes mellitus with other specified complication: Secondary | ICD-10-CM

## 2022-09-16 NOTE — Progress Notes (Unsigned)
Name: Samantha Stephens   MRN: [id number]    DOB: 1961/07/25   Date:09/17/2022       Progress Note  Subjective  Chief Complaint  Follow Up  HPI  Leg Cramps: improved significantly since she stopped taking Zetia, she is still stretching before bed time and drinking Gatorade zero. She is concerned about what to take for cholesterol since she cannot tolerate statins or zetia, she is taking Vascepa already, I sent Nexletol but she states never pick it up . We will send it again.   Insomnia: she used to take Ambien years ago, she is able to fall asleep but not able to stay asleep. She was  worried about weight loss and asked for something that would help her gain weight.  She is sleeping better on Remeron and has gained 10 lbs and wants to maintain her weight now . She states only taking medication prn now   DMII: she is on diet only but not as compliant with her diet, A1C went from 7.6 % to 6.9 % ,7.6 % ,6.9 % today it is up at 7.7%.  She  is  not on ARB because of history of angioedema . She has dyslipidemia but unable to tolerate statins because it causes severe myalgia, even zetia caused pain and we will try adding Nexletol to Vascepa to see if LDL goes to goal.Did not tolerate Metformin in the past caused indigestion and diarrhea, however she states since Chron's is under control and A1C is high she would to try it again.   Sickel cell trait: CBC showed normal HCT but low MCV  Chron's disease: under the care of GI, she states is doing very well on Entivio, no diarrhea or abdominal pain   Patient Active Problem List   Diagnosis Date Noted   Sickle cell trait ([place]) 05/06/2022   Mild cognitive impairment 04/01/2022   Crohn's disease of large intestine without complication ([place]) 25/42/7062   Trochanteric bursitis of right hip 10/18/2021   CPAP use counseling 08/14/2020   Insomnia related to another mental disorder    Vitamin D deficiency 01/07/2020   Vitamin B12 deficiency 01/07/2020    Osteoarthritis of hip 11/12/2019   DDD (degenerative disc disease), cervical 04/19/2019   Hyperlipidemia LDL goal <70 12/16/2018   Occipital headache 09/01/2018   Diabetes mellitus ([place]) 04/22/2018   OSA on CPAP 09/15/2017   Chronic hip pain, right 09/09/2016   Numbness of right foot 09/09/2016   Scoliosis 03/08/2016   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   Situational anxiety 10/27/2015   Hypertension goal BP (blood pressure) < 130/80 08/18/2015   Insomnia, controlled 08/18/2015    Past Surgical History:  Procedure Laterality Date   ABDOMINAL HYSTERECTOMY     CESAREAN SECTION     1   COLONOSCOPY     COLONOSCOPY WITH PROPOFOL N/A 06/18/2021   Procedure: COLONOSCOPY WITH PROPOFOL;  Surgeon: Lin Landsman, MD;  Location: [facility] ENDOSCOPY;  Service: Gastroenterology;  Laterality: N/A;   CYSTOSCOPY W/ RETROGRADES Bilateral 11/25/2016   Procedure: CYSTOSCOPY WITH RETROGRADE PYELOGRAM;  Surgeon: Hollice Espy, MD;  Location: [facility] ORS;  Service: Urology;  Laterality: Bilateral;   EYE SURGERY     lasix eye surgery    Family History  Problem Relation Age of Onset   Arthritis Mother    COPD Mother    Depression Mother    Diabetes Mother    Hyperlipidemia Mother    Hypertension Mother    Cataracts Mother  Heart disease Mother    Hearing loss Father    Kidney disease Father    Cataracts Father    Cancer - Prostate Father        kidney and prostate cancer, colon cancer   Asthma Sister    Cancer Paternal Grandmother    Mental illness Sister    Breast cancer Other     Social History   Tobacco Use   Smoking status: Former    Types: Cigarettes    Quit date: 1990    Years since quitting: 33.9   Smokeless tobacco: Never   Tobacco comments:    but only for a short amount of time  Substance Use Topics   Alcohol use: Yes    Alcohol/week: 0.0 - 1.0 standard drinks of alcohol    Comment: occasional glass of wine     Current Outpatient Medications:     amLODipine (NORVASC) 5 MG tablet, TAKE 1 TABLET(5 MG) BY MOUTH DAILY, Disp: 90 tablet, Rfl: 1   Cholecalciferol (VITAMIN D3) 2000 units TABS, Take 1 capsule by mouth daily., Disp: , Rfl:    FARXIGA 10 MG TABS tablet, TAKE 1 TABLET(10 MG) BY MOUTH DAILY BEFORE BREAKFAST, Disp: 90 tablet, Rfl: 1   fluconazole (DIFLUCAN) 150 MG tablet, Take 1 tablet (150 mg total) by mouth every other day. For three doses weekly prn, Disp: 12 tablet, Rfl: 0   mirtazapine (REMERON) 7.5 MG tablet, TAKE 1 TO 2 TABLETS(7.5 TO 15 MG) BY MOUTH AT BEDTIME, Disp: 60 tablet, Rfl: 0   Multiple Vitamin (MULTIVITAMIN) tablet, Take 1 tablet by mouth daily., Disp: , Rfl:    triamcinolone cream (KENALOG) 0.1 %, Apply 1 application topically 2 (two) times daily., Disp: 30 g, Rfl: 1   VASCEPA 1 g capsule, TAKE 2 CAPSULES(2 GRAMS) BY MOUTH TWICE DAILY, Disp: 360 capsule, Rfl: 1   vedolizumab (ENTYVIO) 300 MG injection, Inject 300 mg into the vein every 8 (eight) weeks., Disp: , Rfl:    Bempedoic Acid (NEXLETOL) 180 MG TABS, Take 1 tablet by mouth daily at 2 PM. (Patient not taking: Reported on 09/17/2022), Disp: 30 tablet, Rfl: 2  Allergies  Allergen Reactions   Trazodone And Nefazodone Other (See Comments)    Syncope; see in ER Feb 2018   Benicar [Olmesartan] Cough   Statins     I personally reviewed active problem list, medication list, allergies, family history, social history, health maintenance with the patient/caregiver today.   ROS  Ten systems reviewed and is negative except as mentioned in HPI  Objective  Vitals:   09/17/22 0747  BP: 126/72  Pulse: 93  Resp: 16  SpO2: 98%  Weight: 153 lb (69.4 kg)  Height: 5' 4"  (1.626 m)    Body mass index is 26.26 kg/m.  Physical Exam  Constitutional: Patient appears well-developed and well-nourished. No distress.  HEENT: head atraumatic, normocephalic, pupils equal and reactive to light, neck supple Cardiovascular: Normal rate, regular rhythm and normal heart  sounds.  No murmur heard. No BLE edema. Pulmonary/Chest: Effort normal and breath sounds normal. No respiratory distress. Abdominal: Soft.  There is no tenderness. Psychiatric: Patient has a normal mood and affect. behavior is normal. Judgment and thought content normal.   Recent Results (from the past 2160 hour(s))  POCT HgB A1C     Status: Abnormal   Collection Time: 09/17/22  7:50 AM  Result Value Ref Range   Hemoglobin A1C 7.7 (A) 4.0 - 5.6 %   HbA1c POC (<> result, manual entry)  HbA1c, POC (prediabetic range)     HbA1c, POC (controlled diabetic range)       PHQ2/9:    09/17/2022    7:48 AM 06/26/2022    2:30 PM 05/24/2022    2:27 PM 11/28/2021    3:19 PM 09/03/2021    2:06 PM  Depression screen PHQ 2/9  Decreased Interest 0 0 0 0 0  Down, Depressed, Hopeless 0 0 0 0 0  PHQ - 2 Score 0 0 0 0 0  Altered sleeping 0 0  0 0  Tired, decreased energy 0 0  0 0  Change in appetite 0 0  0 0  Feeling bad or failure about yourself  0 0  0 0  Trouble concentrating 0 0  0 0  Moving slowly or fidgety/restless 0 0  0 0  Suicidal thoughts 0 0  0 0  PHQ-9 Score 0 0  0 0    phq 9 is negative   Fall Risk:    09/17/2022    7:48 AM 06/26/2022    2:30 PM 05/24/2022    2:27 PM 02/20/2022    3:33 PM 11/28/2021    3:19 PM  Fall Risk   Falls in the past year? 0 0 0 0 0  Number falls in past yr: 0  0  0  Injury with Fall? 0  0  0  Risk for fall due to : No Fall Risks  No Fall Risks No Fall Risks No Fall Risks  Follow up Falls prevention discussed Falls prevention discussed;Education provided;Falls evaluation completed Falls prevention discussed Falls prevention discussed Falls prevention discussed      Functional Status Survey: Is the patient deaf or have difficulty hearing?: Yes Does the patient have difficulty seeing, even when wearing glasses/contacts?: No Does the patient have difficulty concentrating, remembering, or making decisions?: No Does the patient have difficulty walking or  climbing stairs?: No Does the patient have difficulty dressing or bathing?: No Does the patient have difficulty doing errands alone such as visiting a doctor's office or shopping?: No    Assessment & Plan  1. Dyslipidemia associated with type 2 diabetes mellitus (HCC)  - POCT HgB A1C - Bempedoic Acid (NEXLETOL) 180 MG TABS; Take 1 tablet by mouth daily at 2 PM.  Dispense: 90 tablet; Refill: 1 - Dapagliflozin Pro-metFORMIN ER (XIGDUO XR) 10-500 MG TB24; Take 1 tablet by mouth daily.  Dispense: 30 tablet; Refill: 2  2. Breast cancer screening by mammogram  - MM 3D SCREEN BREAST BILATERAL; Future  3. Essential hypertension  - amLODipine (NORVASC) 5 MG tablet; TAKE 1 TABLET(5 MG) BY MOUTH DAILY  Dispense: 90 tablet; Refill: 1  4. Decreased hearing of left ear  - Ambulatory referral to Audiology   5. Crohn's disease of large intestine without complication ([place])  Doing well   6. Insomnia, controlled    7. Sickle cell trait (HCC)  No anemia

## 2022-09-17 ENCOUNTER — Encounter: Payer: Self-pay | Admitting: Family Medicine

## 2022-09-17 ENCOUNTER — Ambulatory Visit (INDEPENDENT_AMBULATORY_CARE_PROVIDER_SITE_OTHER): Payer: Managed Care, Other (non HMO) | Admitting: Family Medicine

## 2022-09-17 VITALS — BP 126/72 | HR 93 | Resp 16 | Ht 64.0 in | Wt 153.0 lb

## 2022-09-17 DIAGNOSIS — I10 Essential (primary) hypertension: Secondary | ICD-10-CM

## 2022-09-17 DIAGNOSIS — D573 Sickle-cell trait: Secondary | ICD-10-CM

## 2022-09-17 DIAGNOSIS — H9192 Unspecified hearing loss, left ear: Secondary | ICD-10-CM

## 2022-09-17 DIAGNOSIS — Z1231 Encounter for screening mammogram for malignant neoplasm of breast: Secondary | ICD-10-CM | POA: Diagnosis not present

## 2022-09-17 DIAGNOSIS — E1169 Type 2 diabetes mellitus with other specified complication: Secondary | ICD-10-CM

## 2022-09-17 DIAGNOSIS — K5010 Crohn's disease of large intestine without complications: Secondary | ICD-10-CM

## 2022-09-17 DIAGNOSIS — E785 Hyperlipidemia, unspecified: Secondary | ICD-10-CM

## 2022-09-17 DIAGNOSIS — G4700 Insomnia, unspecified: Secondary | ICD-10-CM

## 2022-09-17 LAB — POCT GLYCOSYLATED HEMOGLOBIN (HGB A1C): Hemoglobin A1C: 7.7 % — AB (ref 4.0–5.6)

## 2022-09-17 MED ORDER — XIGDUO XR 10-500 MG PO TB24
1.0000 | ORAL_TABLET | Freq: Every day | ORAL | 2 refills | Status: DC
Start: 2022-09-17 — End: 2022-11-15

## 2022-09-17 MED ORDER — NEXLETOL 180 MG PO TABS: 1.0000 | ORAL_TABLET | Freq: Every day | ORAL | 1 refills | Status: DC

## 2022-09-17 MED ORDER — AMLODIPINE BESYLATE 5 MG PO TABS: ORAL_TABLET | ORAL | 1 refills | Status: DC

## 2022-09-23 ENCOUNTER — Ambulatory Visit
Admission: RE | Admit: 2022-09-23 | Discharge: 2022-09-23 | Disposition: A | Discharge status: Home / Self Care | Payer: Managed Care, Other (non HMO) | Source: Ambulatory Visit | Attending: Family Medicine | Admitting: Family Medicine

## 2022-09-23 DIAGNOSIS — Z1231 Encounter for screening mammogram for malignant neoplasm of breast: Secondary | ICD-10-CM | POA: Insufficient documentation

## 2022-10-07 ENCOUNTER — Encounter: Payer: Self-pay | Admitting: Gastroenterology

## 2022-10-07 ENCOUNTER — Other Ambulatory Visit: Payer: Self-pay

## 2022-10-07 ENCOUNTER — Ambulatory Visit (INDEPENDENT_AMBULATORY_CARE_PROVIDER_SITE_OTHER): Payer: Managed Care, Other (non HMO) | Admitting: Gastroenterology

## 2022-10-07 VITALS — BP 129/82 | HR 80 | Temp 98.1°F | Ht 64.0 in | Wt 153.0 lb

## 2022-10-07 DIAGNOSIS — Z23 Encounter for immunization: Secondary | ICD-10-CM | POA: Diagnosis not present

## 2022-10-07 DIAGNOSIS — K5010 Crohn's disease of large intestine without complications: Secondary | ICD-10-CM

## 2022-10-07 MED ORDER — NA SULFATE-K SULFATE-MG SULF 17.5-3.13-1.6 GM/177ML PO SOLN: 354.0000 mL | Freq: Once | ORAL | 0 refills | Status: AC

## 2022-10-07 NOTE — Progress Notes (Signed)
Cephas Darby, MD [address]  [place]  [place], [place] [address]  Main: [phone]  Fax: [phone]    Gastroenterology Consultation  Referring Provider:     Steele Sizer, MD Primary Care Physician:  Steele Sizer, MD Primary Gastroenterologist:  Dr. Cephas Darby Reason for Consultation: Crohn's colitis        HPI:   Samantha Stephens is a 61 y.o. female referred by Dr. Steele Sizer, MD  for consultation & management of Crohn's colitis.  Patient is here for follow-up of Crohn's colitis.  She has been doing well and reports that her symptoms of weight loss, abdominal pain, diarrhea, arthralgias have resolved.  She has gained about 10 pounds.  Her hemoglobin A1c is rising, diabetes medication has been readjusted by her PCP.  She does report chronic pelvic pain.  She underwent a DEXA scan which was normal.  She did not undergo x-ray of the pelvis that was recommended during last visit.  She does not have any other concerns today.  Crohn's disease classification:  Age: 76 to 33 Location: colonic Behavior: non stricturing, non penetrating Perianal: No  IBD diagnosis: Crohn's colitis  Disease course: Patient reports that she was diagnosed with Crohn's disease right after college when she has significant weight loss, abdominal pain associated with severe diarrhea.  She was diagnosed after the colonoscopy. She was intermittently on prednisone at the time of initial diagnosis.  She was not on any maintenance therapy until 08/2021.  She underwent colonoscopy in 05/2021 which confirmed chronic mildly active pancolitis.  Initiated on Entyvio in November 2022 as monotherapy every 8 weeks.  Patient is in complete clinical remission since initiation of Entyvio.  Extra intestinal manifestations: Chronic pelvic pain  IBD surgical history: None  Patient lives by herself after her husband passed away in 12-17-2016.  She works from home to [employer] in the [employer].  She does  have diabetes, her most recent hemoglobin A1c is 7.6.  She is very active, walks every day.  Patient does not smoke or drink alcohol  Imaging:  MRE none CTE none SBFT none  Procedures:  Colonoscopy  Colonoscopy 06/17/2017 by Dr. Vira Agar Excellent prep, internal hemorrhoids were found.  Exam was otherwise normal.  Terminal ileum was not examined  Colonoscopy 06/18/2021 - The examined portion of the ileum was normal. Biopsied. - Normal mucosa in the right colon. Biopsied. - Diffuse mild inflammation was found in the sigmoid colon and in the descending colon secondary to left-sided colitis. Biopsied. - The distal rectum and anal verge are normal on retroflexion view. DIAGNOSIS:  A. TERMINAL ILEUM; COLD BIOPSY:  - FRAGMENTED PORTIONS OF ENTERIC MUCOSA WITH NORMAL VILLOUS ARCHITECTURE  AND REACTIVE LYMPHOID HYPERPLASIA.  - FRAGMENTS OF UNREMARKABLE COLONIC MUCOSA.  - NEGATIVE FOR ACTIVE MUCOSAL ILEITIS/COLITIS.  - NEGATIVE FOR GRANULOMA, DYSPLASIA, AND MALIGNANCY.   B. COLON, RIGHT; COLD BIOPSY:  - CHRONIC COLITIS WITH MILD ACTIVITY (CRYPTITIS).  - SINGLE SMALL SUBMUCOSAL NON-NECROTIZING GRANULOMA IDENTIFIED.  - NEGATIVE FOR DYSPLASIA AND MALIGNANCY.   C. COLON, LEFT; COLD BIOPSY:  - CHRONIC COLITIS WITH MILD ACTIVITY (CRYPTITIS).  - SINGLE SMALL SUBMUCOSAL NON-NECROTIZING GRANULOMA IDENTIFIED.  - NEGATIVE FOR DYSPLASIA AND MALIGNANCY.   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   Diabetes mellitus without complication ([place])    Family hx of colon cancer requiring screening  colonoscopy 03/08/2016   Headache    Hyperlipidemia    Hypertension    Hypokalemia    Insomnia related to another mental disorder    Palpitations    Scoliosis 03/08/2016   Sleep apnea    Stress headaches     Past Surgical History:  Procedure Laterality Date   ABDOMINAL HYSTERECTOMY     CESAREAN SECTION     1   COLONOSCOPY     COLONOSCOPY WITH PROPOFOL N/A 06/18/2021   Procedure: COLONOSCOPY WITH PROPOFOL;  Surgeon: Lin Landsman, MD;  Location: [facility];  Service: Gastroenterology;  Laterality: N/A;   CYSTOSCOPY W/ RETROGRADES Bilateral 11/25/2016   Procedure: CYSTOSCOPY WITH RETROGRADE PYELOGRAM;  Surgeon: Hollice Espy, MD;  Location: [facility];  Service: Urology;  Laterality: Bilateral;   EYE SURGERY     lasix eye surgery    Current Outpatient Medications:    amLODipine (NORVASC) 5 MG tablet, TAKE 1 TABLET(5 MG) BY MOUTH DAILY, Disp: 90 tablet, Rfl: 1   Bempedoic Acid (NEXLETOL) 180 MG TABS, Take 1 tablet by mouth daily at 2 PM., Disp: 90 tablet, Rfl: 1   Cholecalciferol (VITAMIN D3) 2000 units TABS, Take 1 capsule by mouth daily., Disp: , Rfl:    Dapagliflozin Pro-metFORMIN ER (XIGDUO XR) 10-500 MG TB24, Take 1 tablet by mouth daily., Disp: 30 tablet, Rfl: 2   fluconazole (DIFLUCAN) 150 MG tablet, Take 1 tablet (150 mg total) by mouth every other day. For three doses weekly prn, Disp: 12 tablet, Rfl: 0   mirtazapine (REMERON) 7.5 MG tablet, TAKE 1 TO 2 TABLETS(7.5 TO 15 MG) BY MOUTH AT BEDTIME, Disp: 60 tablet, Rfl: 0   Multiple Vitamin (MULTIVITAMIN) tablet, Take 1 tablet by mouth daily., Disp: , Rfl:    Na Sulfate-K Sulfate-Mg Sulf 17.5-3.13-1.6 GM/177ML SOLN, Take 354 mLs by mouth once for 1 dose., Disp: 354 mL, Rfl: 0   triamcinolone cream (KENALOG) 0.1 %, Apply 1 application topically 2 (two) times daily., Disp: 30 g, Rfl: 1   VASCEPA 1 g capsule, TAKE 2 CAPSULES(2 GRAMS) BY MOUTH TWICE DAILY, Disp: 360 capsule, Rfl: 1   vedolizumab (ENTYVIO) 300 MG injection,  Inject 300 mg into the vein every 8 (eight) weeks., Disp: , Rfl:     Family History  Problem Relation Age of Onset   Arthritis Mother    COPD Mother    Depression Mother    Diabetes Mother    Hyperlipidemia Mother    Hypertension Mother    Cataracts Mother    Heart disease Mother    Hearing loss Father    Kidney disease Father    Cataracts Father    Cancer - Prostate Father        kidney and prostate cancer, colon cancer   Asthma Sister    Cancer Paternal Grandmother    Mental illness Sister    Breast cancer Other      Social History   Tobacco Use   Smoking status: Former    Types: Cigarettes    Quit date: 1990    Years since quitting: 33.9   Smokeless tobacco: Never   Tobacco comments:    but only for a short amount of time  Vaping Use   Vaping Use: Never used  Substance Use Topics   Alcohol use: Yes  Alcohol/week: 0.0 - 1.0 standard drinks of alcohol    Comment: occasional glass of wine   Drug use: No    Allergies as of 10/07/2022 - Review Complete 10/07/2022  Allergen Reaction Noted   Trazodone and nefazodone Other (See Comments) 11/22/2016   Benicar [olmesartan] Cough 02/02/2018   Statins  04/19/2019    Review of Systems:    All systems reviewed and negative except where noted in HPI.   Physical Exam:  BP 129/82 (BP Location: Left Arm, Patient Position: Sitting, Cuff Size: Normal)   Pulse 80   Temp 98.1 F (36.7 C) (Oral)   Ht 5' 4"  (1.626 m)   Wt 153 lb (69.4 kg)   BMI 26.26 kg/m  No LMP recorded. Patient has had a hysterectomy.  General:   Alert,  Well-developed, well-nourished, pleasant and cooperative in NAD Head:  Normocephalic and atraumatic. Eyes:  Sclera clear, no icterus.   Conjunctiva pink. Ears:  Normal auditory acuity. Nose:  No deformity, discharge, or lesions. Mouth:  No deformity or lesions,oropharynx pink & moist. Neck:  Supple; no masses or thyromegaly. Lungs:  Respirations even and unlabored.  Clear throughout to  auscultation.   No wheezes, crackles, or rhonchi. No acute distress. Heart:  Regular rate and rhythm; no murmurs, clicks, rubs, or gallops. Abdomen:  Normal bowel sounds. Soft, non-tender and non-distended without masses, hepatosplenomegaly or hernias noted.  No guarding or rebound tenderness.   Rectal: Not performed Msk:  Symmetrical without gross deformities. Good, equal movement & strength bilaterally. Pulses:  Normal pulses noted. Extremities:  No clubbing or edema.  No cyanosis. Neurologic:  Alert and oriented x3;  grossly normal neurologically. Skin:  Intact without significant lesions or rashes. No jaundice. Psych:  Alert and cooperative. Normal mood and affect.  Imaging Studies: No recent abdominal imaging  Assessment and Plan:   Fusako P Calaway is a 61 y.o. pleasant African-American female with diabetes, history of Crohn's disease diagnosed in her 15s is seen in consultation for management of Crohn's colitis.  Exacerbation of Crohn's disease in 8/ 2022.  No evidence of anemia including iron deficiency or B12 deficiency.  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 Anemia: Not present 3.Immunizations: Hep A and B serologies negative, recommend Twinrix vaccine.  Will arrange nurse visit in January, Influenza up-to-date, prevnar received, pneumovax received, Zoster received Shingrix vaccine x 2 doses in 2022 4.Cancer screening I) Colon cancer/dysplasia surveillance: Recommend colonoscopy II) Cervical cancer: N/A s/p hysterectomy III) Skin cancer - counseled about annual skin exam by dermatology and skin protection in summer using sun screen SPF > 50,  clothing 5.Bone health Vitamin D status: Normal Bone density testing: DEXA scan on 02/19/2022 normal 5. Labs: Check CBC, CMP today 6. Smoking: Never smoked 7. NSAIDs and Antibiotics use: None frequently   Chronic pelvic pain: Recommend x-ray pelvis   Follow up in 6 months   Cephas Darby, MD

## 2022-10-07 NOTE — Patient Instructions (Signed)
Your pelvic Xray is schedule for 10/22/2021 [facility] the medical mall at 8:45am for a 9:00am scan. If you can not make this appointment please call [phone] option 3 and then Option 2.  Please come back in January for a nurse visit to get your Hepatitis A and B vaccine. Please call the day before at [phone] so I can put you on the schedule

## 2022-10-08 LAB — CBC
Hematocrit: 43 % (ref 34.0–46.6)
Hemoglobin: 14.3 g/dL (ref 11.1–15.9)
MCH: 25 pg — ABNORMAL LOW (ref 26.6–33.0)
MCHC: 33.3 g/dL (ref 31.5–35.7)
MCV: 75 fL — ABNORMAL LOW (ref 79–97)
Platelets: 274 10*3/uL (ref 150–450)
RBC: 5.73 x10E6/uL — ABNORMAL HIGH (ref 3.77–5.28)
RDW: 14.6 % (ref 11.7–15.4)
WBC: 6.2 10*3/uL (ref 3.4–10.8)

## 2022-10-08 LAB — COMPREHENSIVE METABOLIC PANEL
ALT: 19 IU/L (ref 0–32)
AST: 25 IU/L (ref 0–40)
Albumin/Globulin Ratio: 1.8 (ref 1.2–2.2)
Albumin: 4.8 g/dL (ref 3.9–4.9)
Alkaline Phosphatase: 93 IU/L (ref 44–121)
BUN/Creatinine Ratio: 16 (ref 12–28)
BUN: 14 mg/dL (ref 8–27)
Bilirubin Total: 0.3 mg/dL (ref 0.0–1.2)
CO2: 22 mmol/L (ref 20–29)
Calcium: 9.7 mg/dL (ref 8.7–10.3)
Chloride: 102 mmol/L (ref 96–106)
Creatinine, Ser: 0.89 mg/dL (ref 0.57–1.00)
Globulin, Total: 2.6 g/dL (ref 1.5–4.5)
Glucose: 137 mg/dL — ABNORMAL HIGH (ref 70–99)
Potassium: 3.8 mmol/L (ref 3.5–5.2)
Sodium: 140 mmol/L (ref 134–144)
Total Protein: 7.4 g/dL (ref 6.0–8.5)
eGFR: 73 mL/min/{1.73_m2} (ref >59)

## 2022-10-22 ENCOUNTER — Ambulatory Visit
Admission: RE | Admit: 2022-10-22 | Discharge: 2022-10-22 | Disposition: A | Discharge status: Home / Self Care | Payer: Managed Care, Other (non HMO) | Source: Ambulatory Visit | Attending: Gastroenterology | Admitting: Gastroenterology

## 2022-10-22 ENCOUNTER — Other Ambulatory Visit: Payer: Self-pay | Admitting: Gastroenterology

## 2022-10-22 DIAGNOSIS — M545 Low back pain, unspecified: Secondary | ICD-10-CM | POA: Insufficient documentation

## 2022-10-24 ENCOUNTER — Encounter: Payer: Self-pay | Admitting: Gastroenterology

## 2022-11-15 ENCOUNTER — Other Ambulatory Visit: Payer: Self-pay

## 2022-11-15 DIAGNOSIS — E785 Hyperlipidemia, unspecified: Secondary | ICD-10-CM

## 2022-11-15 MED ORDER — XIGDUO XR 10-500 MG PO TB24: 1.0000 | ORAL_TABLET | Freq: Every day | ORAL | 2 refills | Status: DC

## 2022-11-20 ENCOUNTER — Encounter: Payer: Self-pay | Admitting: Gastroenterology

## 2022-11-22 ENCOUNTER — Ambulatory Visit: Payer: Managed Care, Other (non HMO) | Admitting: Registered Nurse

## 2022-11-22 ENCOUNTER — Encounter: Payer: Self-pay | Admitting: Gastroenterology

## 2022-11-22 ENCOUNTER — Ambulatory Visit
Admission: RE | Admit: 2022-11-22 | Discharge: 2022-11-22 | Disposition: A | Discharge status: Home / Self Care | Payer: Managed Care, Other (non HMO) | Attending: Gastroenterology | Admitting: Gastroenterology

## 2022-11-22 ENCOUNTER — Encounter
Admission: RE | Disposition: A | Discharge status: Home / Self Care | Payer: Self-pay | Source: Home / Self Care | Attending: Gastroenterology

## 2022-11-22 DIAGNOSIS — K5010 Crohn's disease of large intestine without complications: Secondary | ICD-10-CM | POA: Diagnosis not present

## 2022-11-22 DIAGNOSIS — G4730 Sleep apnea, unspecified: Secondary | ICD-10-CM | POA: Insufficient documentation

## 2022-11-22 DIAGNOSIS — E785 Hyperlipidemia, unspecified: Secondary | ICD-10-CM | POA: Diagnosis not present

## 2022-11-22 DIAGNOSIS — M1990 Unspecified osteoarthritis, unspecified site: Secondary | ICD-10-CM | POA: Diagnosis not present

## 2022-11-22 DIAGNOSIS — F419 Anxiety disorder, unspecified: Secondary | ICD-10-CM | POA: Diagnosis not present

## 2022-11-22 DIAGNOSIS — Z800 Family history of malignant neoplasm of digestive organs: Secondary | ICD-10-CM | POA: Insufficient documentation

## 2022-11-22 DIAGNOSIS — Z87891 Personal history of nicotine dependence: Secondary | ICD-10-CM | POA: Insufficient documentation

## 2022-11-22 DIAGNOSIS — M419 Scoliosis, unspecified: Secondary | ICD-10-CM | POA: Diagnosis not present

## 2022-11-22 DIAGNOSIS — Z7984 Long term (current) use of oral hypoglycemic drugs: Secondary | ICD-10-CM | POA: Insufficient documentation

## 2022-11-22 DIAGNOSIS — Z79899 Other long term (current) drug therapy: Secondary | ICD-10-CM | POA: Diagnosis not present

## 2022-11-22 DIAGNOSIS — I10 Essential (primary) hypertension: Secondary | ICD-10-CM | POA: Diagnosis not present

## 2022-11-22 DIAGNOSIS — E119 Type 2 diabetes mellitus without complications: Secondary | ICD-10-CM | POA: Diagnosis not present

## 2022-11-22 HISTORY — PX: COLONOSCOPY WITH PROPOFOL: SHX5780

## 2022-11-22 LAB — GLUCOSE, CAPILLARY: Glucose-Capillary: 123 mg/dL — ABNORMAL HIGH (ref 70–99)

## 2022-11-22 SURGERY — COLONOSCOPY WITH PROPOFOL
Anesthesia: General

## 2022-11-22 MED ORDER — PROPOFOL 1000 MG/100ML IV EMUL
INTRAVENOUS | Status: AC
  Filled 2022-11-22: qty 100

## 2022-11-22 MED ORDER — PROPOFOL 500 MG/50ML IV EMUL
INTRAVENOUS | Status: DC | PRN
  Administered 2022-11-22: 171.717 ug/kg/min via INTRAVENOUS

## 2022-11-22 MED ORDER — SODIUM CHLORIDE 0.9 % IV SOLN
INTRAVENOUS | Status: DC
  Administered 2022-11-22: 20 mL/h via INTRAVENOUS

## 2022-11-22 MED ORDER — PROPOFOL 10 MG/ML IV BOLUS
INTRAVENOUS | Status: DC | PRN
  Administered 2022-11-22: 70 mg via INTRAVENOUS
  Administered 2022-11-22: 20 mg via INTRAVENOUS
  Administered 2022-11-22: 30 mg via INTRAVENOUS

## 2022-11-22 NOTE — Anesthesia Preprocedure Evaluation (Signed)
Anesthesia Evaluation  Patient identified by MRN, date of birth, ID band Patient awake    Reviewed: Allergy & Precautions, H&P , NPO status , Patient's Chart, lab work & pertinent test results, reviewed documented beta blocker date and time   History of Anesthesia Complications Negative for: history of anesthetic complications  Airway Mallampati: II   Neck ROM: full    Dental  (+) Teeth Intact, Dental Advidsory Given   Pulmonary neg shortness of breath, sleep apnea and Continuous Positive Airway Pressure Ventilation , neg COPD, neg recent URI, former smoker   Pulmonary exam normal        Cardiovascular Exercise Tolerance: Good hypertension, On Medications (-) angina (-) Past MI and (-) Cardiac Stents negative cardio ROS Normal cardiovascular exam(-) dysrhythmias (-) Valvular Problems/Murmurs Rhythm:regular Rate:Normal     Neuro/Psych  Headaches PSYCHIATRIC DISORDERS Anxiety        GI/Hepatic negative GI ROS, Neg liver ROS,,,  Endo/Other  negative endocrine ROSdiabetes, Type 2, Oral Hypoglycemic Agents    Renal/GU negative Renal ROS  negative genitourinary   Musculoskeletal   Abdominal   Peds  Hematology negative hematology ROS (+)   Anesthesia Other Findings Past Medical History: No date: Arthritis 08/18/2015: Chronic mid back pain 03/08/2016: Degenerative arthritis of lumbar spine No date: Diabetes mellitus without complication ([place]) 78/29/5621: Family hx of colon cancer requiring screening colonoscopy No date: Headache No date: Hyperlipidemia No date: Hypertension No date: Hypokalemia No date: Insomnia related to another mental disorder No date: Palpitations 03/08/2016: Scoliosis No date: Sleep apnea No date: Stress headaches Past Surgical History: No date: ABDOMINAL HYSTERECTOMY No date: CESAREAN SECTION     Comment:  1 No date: COLONOSCOPY 11/25/2016: CYSTOSCOPY W/ RETROGRADES; Bilateral     Comment:   Procedure: CYSTOSCOPY WITH RETROGRADE PYELOGRAM;                Surgeon: Hollice Espy, MD;  Location: [facility];                Service: Urology;  Laterality: Bilateral; No date: EYE SURGERY     Comment:  lasix eye surgery BMI    Body Mass Index: 24.89 kg/m     Reproductive/Obstetrics negative OB ROS                             Anesthesia Physical Anesthesia Plan  ASA: 3  Anesthesia Plan: General   Post-op Pain Management:    Induction: Intravenous  PONV Risk Score and Plan: 3 and Propofol infusion and TIVA  Airway Management Planned: Natural Airway and Nasal Cannula  Additional Equipment:   Intra-op Plan:   Post-operative Plan:   Informed Consent: I have reviewed the patients History and Physical, chart, labs and discussed the procedure including the risks, benefits and alternatives for the proposed anesthesia with the patient or authorized representative who has indicated his/her understanding and acceptance.     Dental Advisory Given  Plan Discussed with: CRNA  Anesthesia Plan Comments:         Anesthesia Quick Evaluation

## 2022-11-22 NOTE — Op Note (Signed)
[facility] Gastroenterology Patient Name: Samantha Stephens Procedure Date: 11/22/2022 9:18 AM MRN: [id number] Account #: [id number] Date of Birth: 09/09/1961 Admit Type: Outpatient Age: 62 Room: [facility] ENDO ROOM 2 Gender: Female Note Status: Finalized Instrument Name: Colonscope [id number] Procedure:             Colonoscopy Indications:           Last colonoscopy: August 2022, Follow-up of Crohn's                         disease of the colon, Assess therapeutic response to                         therapy of Crohn's disease of the colon Providers:             Lin Landsman MD, MD Referring MD:          Bethena Roys. Sowles, MD (Referring MD) Medicines:             General Anesthesia Complications:         No immediate complications. Estimated blood loss: None. Procedure:             Pre-Anesthesia Assessment:                        - Prior to the procedure, a History and Physical was                         performed, and patient medications and allergies were                         reviewed. The patient is competent. The risks and                         benefits of the procedure and the sedation options and                         risks were discussed with the patient. All questions                         were answered and informed consent was obtained.                         Patient identification and proposed procedure were                         verified by the physician, the nurse, the                         anesthesiologist, the anesthetist and the technician                         in the pre-procedure area in the procedure room in the                         endoscopy suite. Mental Status Examination: alert and                         oriented. Airway Examination: normal oropharyngeal  airway and neck mobility. Respiratory Examination:                         clear to auscultation. CV Examination: normal.                          Prophylactic Antibiotics: The patient does not require                         prophylactic antibiotics. Prior Anticoagulants: The                         patient has taken no anticoagulant or antiplatelet                         agents. ASA Grade Assessment: III - A patient with                         severe systemic disease. After reviewing the risks and                         benefits, the patient was deemed in satisfactory                         condition to undergo the procedure. The anesthesia                         plan was to use general anesthesia. Immediately prior                         to administration of medications, the patient was                         re-assessed for adequacy to receive sedatives. The                         heart rate, respiratory rate, oxygen saturations,                         blood pressure, adequacy of pulmonary ventilation, and                         response to care were monitored throughout the                         procedure. The physical status of the patient was                         re-assessed after the procedure.                        After obtaining informed consent, the colonoscope was                         passed under direct vision. Throughout the procedure,                         the patient's blood pressure, pulse, and oxygen  saturations were monitored continuously. The                         Colonoscope was introduced through the anus and                         advanced to the the terminal ileum, with                         identification of the appendiceal orifice and IC                         valve. The colonoscopy was performed without                         difficulty. The patient tolerated the procedure well.                         The quality of the bowel preparation was evaluated                         using the BBPS [facility] Bowel Preparation Scale) with                          scores of: Right Colon = 3, Transverse Colon = 3 and                         Left Colon = 3 (entire mucosa seen well with no                         residual staining, small fragments of stool or opaque                         liquid). The total BBPS score equals 9. The terminal                         ileum, ileocecal valve, appendiceal orifice, and                         rectum were photographed. Findings:      The perianal and digital rectal examinations were normal. Pertinent       negatives include normal sphincter tone and no palpable rectal lesions.      The terminal ileum appeared normal.      The entire examined colon appeared normal. Biopsies were taken with a       cold forceps for histology.      The retroflexed view of the distal rectum and anal verge was normal and       showed no anal or rectal abnormalities. Impression:            - The examined portion of the ileum was normal.                        - The entire examined colon is normal. Biopsied.                        - The distal rectum and anal verge are normal on  retroflexion view. Recommendation:        - Discharge patient to home (with escort).                        - Resume previous diet today.                        - Continue present medications.                        - Await pathology results.                        - Repeat colonoscopy in 10 years for screening                         purposes.                        - Return to my office as previously scheduled. Procedure Code(s):     --- Professional ---                        [phone], Colonoscopy, flexible; with biopsy, single or                         multiple Diagnosis Code(s):     --- Professional ---                        K50.10, Crohn's disease of large intestine without                         complications CPT copyright 2022 American Medical Association. All rights reserved. The codes documented in this report are  preliminary and upon coder review may  be revised to meet current compliance requirements. Dr. Ulyess Mort Lin Landsman MD, MD 11/22/2022 9:53:14 AM This report has been signed electronically. Number of Addenda: 0 Note Initiated On: 11/22/2022 9:18 AM Scope Withdrawal Time: 0 hours 10 minutes 40 seconds  Total Procedure Duration: 0 hours 14 minutes 26 seconds  Estimated Blood Loss:  Estimated blood loss: none.      [facility]

## 2022-11-22 NOTE — H&P (Signed)
Cephas Darby, MD [address]  [place]  [place], [place] [address]  Main: [phone]  Fax: [phone] Pager: [phone]  Primary Care Physician:  Steele Sizer, MD Primary Gastroenterologist:  Dr. Cephas Darby  Pre-Procedure History & Physical: HPI:  Samantha Stephens is a 62 y.o. female is here for an colonoscopy.   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   Diabetes mellitus without complication ([place])    Family hx of colon cancer requiring screening colonoscopy 03/08/2016   Headache    Hyperlipidemia    Hypertension    Hypokalemia    Insomnia related to another mental disorder    Palpitations    Scoliosis 03/08/2016   Sleep apnea    Stress headaches     Past Surgical History:  Procedure Laterality Date   ABDOMINAL HYSTERECTOMY     CESAREAN SECTION     1   COLONOSCOPY     COLONOSCOPY WITH PROPOFOL N/A 06/18/2021   Procedure: COLONOSCOPY WITH PROPOFOL;  Surgeon: Lin Landsman, MD;  Location: [facility];  Service: Gastroenterology;  Laterality: N/A;   CYSTOSCOPY W/ RETROGRADES Bilateral 11/25/2016   Procedure: CYSTOSCOPY WITH RETROGRADE PYELOGRAM;  Surgeon: Hollice Espy, MD;  Location: [facility];  Service: Urology;  Laterality: Bilateral;   EYE SURGERY     lasix eye surgery    Prior to Admission medications   Medication Sig Start Date End Date Taking? Authorizing Provider  amLODipine (NORVASC) 5 MG tablet TAKE 1 TABLET(5 MG) BY MOUTH DAILY 09/17/22   Ancil Boozer, Drue Stager, MD  Bempedoic Acid (NEXLETOL) 180 MG TABS Take 1 tablet by mouth daily at 2 PM. 09/17/22   Ancil Boozer, Drue Stager, MD  Cholecalciferol (VITAMIN D3) 2000 units TABS Take 1 capsule by mouth daily.    [provider]  Dapagliflozin Pro-metFORMIN ER (XIGDUO XR) 10-500 MG TB24 Take 1 tablet by mouth daily. 11/15/22   Steele Sizer, MD  fluconazole (DIFLUCAN) 150 MG tablet Take 1  tablet (150 mg total) by mouth every other day. For three doses weekly prn 06/26/22   Steele Sizer, MD  mirtazapine (REMERON) 7.5 MG tablet TAKE 1 TO 2 TABLETS(7.5 TO 15 MG) BY MOUTH AT BEDTIME 07/30/22   Steele Sizer, MD  Multiple Vitamin (MULTIVITAMIN) tablet Take 1 tablet by mouth daily.    [provider]  triamcinolone cream (KENALOG) 0.1 % Apply 1 application topically 2 (two) times daily. 08/22/21   Steele Sizer, MD  VASCEPA 1 g capsule TAKE 2 CAPSULES(2 GRAMS) BY MOUTH TWICE DAILY 08/27/22   Ancil Boozer, Drue Stager, MD  vedolizumab (ENTYVIO) 300 MG injection Inject 300 mg into the vein every 8 (eight) weeks.    Lin Landsman, MD    Allergies as of 10/07/2022 - Review Complete 10/07/2022  Allergen Reaction Noted   Trazodone and nefazodone Other (See Comments) 11/22/2016   Benicar [olmesartan] Cough 02/02/2018   Statins  04/19/2019    Family History  Problem Relation Age of Onset   Arthritis Mother    COPD Mother    Depression Mother    Diabetes Mother    Hyperlipidemia Mother    Hypertension Mother    Cataracts Mother    Heart disease Mother    Hearing loss Father    Kidney disease Father    Cataracts Father    Cancer - Prostate Father        kidney and prostate cancer, colon cancer  Asthma Sister    Cancer Paternal Grandmother    Mental illness Sister    Breast cancer Other     Social History   Socioeconomic History   Marital status: Widowed    Spouse name: Not on file   Number of children: 1   Years of education: Not on file   Highest education level: Bachelor's degree (e.g., BA, AB, BS)  Occupational History   Occupation: IT   Tobacco Use   Smoking status: Former    Types: Cigarettes    Quit date: 1990    Years since quitting: 34.1   Smokeless tobacco: Never   Tobacco comments:    but only for a short amount of time  Vaping Use   Vaping Use: Never used  Substance and Sexual Activity   Alcohol use: Yes    Alcohol/week: 0.0 - 1.0 standard  drinks of alcohol    Comment: occasional glass of wine   Drug use: No   Sexual activity: Yes    Partners: Male  Other Topics Concern   Not on file  Social History Narrative   Not on file   Social Determinants of Health   Financial Resource Strain: Low Risk  (05/22/2021)   Overall Financial Resource Strain (CARDIA)    Difficulty of Paying Living Expenses: Not hard at all  Food Insecurity: No Food Insecurity (05/22/2021)   Hunger Vital Sign    Worried About Running Out of Food in the Last Year: Never true    [place] in the Last Year: Never true  Transportation Needs: No Transportation Needs (05/22/2021)   PRAPARE - Hydrologist (Medical): No    Lack of Transportation (Non-Medical): No  Physical Activity: Sufficiently Active (05/22/2021)   Exercise Vital Sign    Days of Exercise per Week: 5 days    Minutes of Exercise per Session: 60 min  Stress: No Stress Concern Present (05/22/2021)   [place]    Feeling of Stress : Only a little  Social Connections: Moderately Integrated (05/22/2021)   Social Connection and Isolation Panel [NHANES]    Frequency of Communication with Friends and Family: More than three times a week    Frequency of Social Gatherings with Friends and Family: Once a week    Attends Religious Services: More than 4 times per year    Active Member of [employer] or Organizations: Yes    Attends Archivist Meetings: More than 4 times per year    Marital Status: Widowed  Intimate Partner Violence: Not At Risk (05/22/2021)   Humiliation, Afraid, Rape, and Kick questionnaire    Fear of Current or Ex-Partner: No    Emotionally Abused: No    Physically Abused: No    Sexually Abused: No    Review of Systems: See HPI, otherwise negative ROS  Physical Exam: BP 127/86   Pulse 72   Temp (!) 96.8 F (36 C) (Temporal)   Resp 20   Ht 5\' 4"  (1.626 m)   Wt 66 kg   SpO2 100%    BMI 24.99 kg/m  General:   Alert,  pleasant and cooperative in NAD Head:  Normocephalic and atraumatic. Neck:  Supple; no masses or thyromegaly. Lungs:  Clear throughout to auscultation.    Heart:  Regular rate and rhythm. Abdomen:  Soft, nontender and nondistended. Normal bowel sounds, without guarding, and without rebound.   Neurologic:  Alert and  oriented x4;  grossly normal neurologically.  Impression/Plan: Samantha Stephens is here for an colonoscopy to be performed for Crohn's colitis   Risks, benefits, limitations, and alternatives regarding  colonoscopy have been reviewed with the patient.  Questions have been answered.  All parties agreeable.   Sherri Sear, MD  11/22/2022, 9:12 AM

## 2022-11-22 NOTE — Transfer of Care (Signed)
Immediate Anesthesia Transfer of Care Note  Patient: Samantha Stephens  Procedure(s) Performed: COLONOSCOPY WITH PROPOFOL  Patient Location: Endoscopy Unit  Anesthesia Type:General  Level of Consciousness: drowsy  Airway & Oxygen Therapy: Patient Spontanous Breathing  Post-op Assessment: Report given to RN and Post -op Vital signs reviewed and stable  Post vital signs: Reviewed and stable  Last Vitals:  Vitals Value Taken Time  BP 96/64 11/22/22 0955  Temp    Pulse 73 11/22/22 0955  Resp    SpO2 100 % 11/22/22 0955    Last Pain:  Vitals:   11/22/22 0955  TempSrc:   PainSc: Asleep         Complications: No notable events documented.

## 2022-11-25 ENCOUNTER — Encounter: Payer: Self-pay | Admitting: Gastroenterology

## 2022-11-25 LAB — SURGICAL PATHOLOGY

## 2022-12-12 NOTE — Anesthesia Postprocedure Evaluation (Signed)
Anesthesia Post Note  Patient: Samantha Stephens  Procedure(s) Performed: COLONOSCOPY WITH PROPOFOL  Patient location during evaluation: Endoscopy Anesthesia Type: General Level of consciousness: awake and alert Pain management: pain level controlled Vital Signs Assessment: post-procedure vital signs reviewed and stable Respiratory status: spontaneous breathing, nonlabored ventilation, respiratory function stable and patient connected to nasal cannula oxygen Cardiovascular status: blood pressure returned to baseline and stable Postop Assessment: no apparent nausea or vomiting Anesthetic complications: no   No notable events documented.   Last Vitals:  Vitals:   11/22/22 1005 11/22/22 1015  BP: 111/86 120/82  Pulse: 70 74  Resp: 15 15  Temp:    SpO2: 100% 100%    Last Pain:  Vitals:   11/22/22 1015  TempSrc:   PainSc: 0-No pain                 Martha Clan

## 2023-01-03 NOTE — Progress Notes (Unsigned)
Name: Samantha Stephens   MRN: [id number]    DOB: 1961/01/12   Date:01/06/2023       Progress Note  Subjective  Chief Complaint  Follow Up  HPI  Leg Cramps: she is still stretching before bed time and drinking Gatorade zero. She is concerned about what to take for cholesterol since she cannot tolerate statins or zetia. She states leg cramps are seldom now and still improves with electrolyte replacement   Insomnia: she used to take Ambien years ago, she is able to fall asleep but not able to stay asleep. She was worried about weight loss and asked for something that would help her gain weight.  She states she liked gaining some weight, but she stopped medication and lost some weight since last visit, she states more active but happy with her weight so far. She states her routine is better for sleep and no longer needs it at this time   DMII: she is on diet only but not as compliant with her diet, A1C went from 7.6 % to 6.9 % ,7.6 % ,6.9 % it went up to 7.7%.  She  is  not on ARB because of history of angioedema . She has dyslipidemia but unable to tolerate statins bshe is only taking Vascepa , did not start Nexletol. She changed her diet and is also exercising. She is taking Xigduo and tolerating it well  Hearing loss: seen by audiologist, she has hearing loss and also tinnitus.   Sickel cell trait: CBC showed normal HCT but low MCV, we will recheck labs today   Chron's disease: under the care of GI, she states is doing very well on Entivio, no diarrhea or abdominal pain Colonoscopy up to date and will repeat it in 2026   OA: DDD lumbar spine also OA hip, noticing more pain on groin lately, not affecting her ability to go to water aerobics , walk but is starting to affect her sleep. Discussed starting Tylenol qhs and may take prn during the day, max of 2 grams   Patient Active Problem List   Diagnosis Date Noted   Statin myopathy 01/06/2023   Crohn's colitis, without complications ([place])  [id number]   Sickle cell trait ([place]) 05/06/2022   Crohn's disease of large intestine without complication ([place]) 99991111   Trochanteric bursitis of right hip 10/18/2021   CPAP use counseling 08/14/2020   Insomnia related to another mental disorder    Vitamin D deficiency 01/07/2020   Low serum vitamin B12 01/07/2020   Osteoarthritis of hip 11/12/2019   DDD (degenerative disc disease), cervical 04/19/2019   Hyperlipidemia LDL goal <70 12/16/2018   Occipital headache 09/01/2018   Dyslipidemia associated with type 2 diabetes mellitus ([place]) 04/22/2018   OSA on CPAP 09/15/2017   Chronic hip pain, right 09/09/2016   Numbness of right foot 09/09/2016   Scoliosis 03/08/2016   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   Situational anxiety 10/27/2015   Essential hypertension 08/18/2015   Chronic insomnia 08/18/2015    Past Surgical History:  Procedure Laterality Date   ABDOMINAL HYSTERECTOMY     CESAREAN SECTION     1   COLONOSCOPY     COLONOSCOPY WITH PROPOFOL N/A 06/18/2021   Procedure: COLONOSCOPY WITH PROPOFOL;  Surgeon: Lin Landsman, MD;  Location: [facility];  Service: Gastroenterology;  Laterality: N/A;   COLONOSCOPY WITH PROPOFOL N/A 11/22/2022   Procedure: COLONOSCOPY WITH PROPOFOL;  Surgeon: Lin Landsman, MD;  Location: [facility] END[facility];  Service: Gastroenterology;  Laterality: N/A;   CYSTOSCOPY W/ RETROGRADES Bilateral 11/25/2016   Procedure: CYSTOSCOPY WITH RETROGRADE PYELOGRAM;  Surgeon: Hollice Espy, MD;  Location: [facility];  Service: Urology;  Laterality: Bilateral;   EYE SURGERY     lasix eye surgery    Family History  Problem Relation Age of Onset   Arthritis Mother    COPD Mother    Depression Mother    Diabetes Mother    Hyperlipidemia Mother    Hypertension Mother    Cataracts Mother    Heart disease Mother    Hearing loss Father    Kidney disease Father    Cataracts Father    Cancer - Prostate Father        kidney and prostate  cancer, colon cancer   Asthma Sister    Cancer Paternal Grandmother    Mental illness Sister    Breast cancer Other     Social History   Tobacco Use   Smoking status: Former    Types: Cigarettes    Quit date: 1990    Years since quitting: 34.2   Smokeless tobacco: Never   Tobacco comments:    but only for a short amount of time  Substance Use Topics   Alcohol use: Yes    Alcohol/week: 0.0 - 1.0 standard drinks of alcohol    Comment: occasional glass of wine     Current Outpatient Medications:    Cholecalciferol (VITAMIN D3) 2000 units TABS, Take 1 capsule by mouth daily., Disp: , Rfl:    fluconazole (DIFLUCAN) 150 MG tablet, Take 1 tablet (150 mg total) by mouth every other day. For three doses weekly prn, Disp: 12 tablet, Rfl: 0   Multiple Vitamin (MULTIVITAMIN) tablet, Take 1 tablet by mouth daily., Disp: , Rfl:    triamcinolone cream (KENALOG) 0.1 %, Apply 1 application topically 2 (two) times daily., Disp: 30 g, Rfl: 1   vedolizumab (ENTYVIO) 300 MG injection, Inject 300 mg into the vein every 8 (eight) weeks., Disp: , Rfl:    amLODipine (NORVASC) 5 MG tablet, TAKE 1 TABLET(5 MG) BY MOUTH DAILY, Disp: 90 tablet, Rfl: 1   Dapagliflozin Pro-metFORMIN ER (XIGDUO XR) 10-500 MG TB24, Take 1 tablet by mouth daily., Disp: 90 tablet, Rfl: 1   VASCEPA 1 g capsule, TAKE 2 CAPSULES(2 GRAMS) BY MOUTH TWICE DAILY, Disp: 360 capsule, Rfl: 1  Allergies  Allergen Reactions   Trazodone And Nefazodone Other (See Comments)    Syncope; see in ER Feb 2018   Benicar [Olmesartan] Cough   Statins     I personally reviewed active problem list, medication list, allergies, family history, social history, health maintenance with the patient/caregiver today.   ROS  Constitutional: Negative for fever or weight change.  Respiratory: Negative for cough and shortness of breath.   Cardiovascular: Negative for chest pain or palpitations.  Gastrointestinal: Negative for abdominal pain, no bowel  changes.  Musculoskeletal: Negative for gait problem or joint swelling.  Skin: Negative for rash.  Neurological: Negative for dizziness or headache.  No other specific complaints in a complete review of systems (except as listed in HPI above).   Objective  Vitals:   01/06/23 0808  BP: 122/82  Pulse: 93  Resp: 16  SpO2: 99%  Weight: 148 lb (67.1 kg)  Height: 5\' 4"  (1.626 m)    Body mass index is 25.4 kg/m.  Physical Exam  Constitutional: Patient appears well-developed and well-nourished.  No distress.  HEENT: head atraumatic, normocephalic, pupils equal and reactive to light,  neck supple Cardiovascular: Normal rate, regular rhythm and normal heart sounds.  No murmur heard. No BLE edema. Pulmonary/Chest: Effort normal and breath sounds normal. No respiratory distress. Abdominal: Soft.  There is no tenderness. Psychiatric: Patient has a normal mood and affect. behavior is normal. Judgment and thought content normal.    PHQ2/9:    01/06/2023    8:15 AM 09/17/2022    7:48 AM 06/26/2022    2:30 PM 05/24/2022    2:27 PM 11/28/2021    3:19 PM  Depression screen PHQ 2/9  Decreased Interest 0 0 0 0 0  Down, Depressed, Hopeless 0 0 0 0 0  PHQ - 2 Score 0 0 0 0 0  Altered sleeping 0 0 0  0  Tired, decreased energy 0 0 0  0  Change in appetite 0 0 0  0  Feeling bad or failure about yourself  0 0 0  0  Trouble concentrating 0 0 0  0  Moving slowly or fidgety/restless 0 0 0  0  Suicidal thoughts 0 0 0  0  PHQ-9 Score 0 0 0  0    phq 9 is negative   Fall Risk:    01/06/2023    8:15 AM 09/17/2022    7:48 AM 06/26/2022    2:30 PM 05/24/2022    2:27 PM 02/20/2022    3:33 PM  Fall Risk   Falls in the past year? 0 0 0 0 0  Number falls in past yr: 0 0  0   Injury with Fall? 0 0  0   Risk for fall due to : No Fall Risks No Fall Risks  No Fall Risks No Fall Risks  Follow up Falls prevention discussed Falls prevention discussed Falls prevention discussed;Education provided;Falls  evaluation completed Falls prevention discussed Falls prevention discussed      Functional Status Survey: Is the patient deaf or have difficulty hearing?: Yes Does the patient have difficulty seeing, even when wearing glasses/contacts?: No Does the patient have difficulty concentrating, remembering, or making decisions?: No Does the patient have difficulty walking or climbing stairs?: No Does the patient have difficulty dressing or bathing?: No Does the patient have difficulty doing errands alone such as visiting a doctor's office or shopping?: No    Assessment & Plan  1. Dyslipidemia associated with type 2 diabetes mellitus (HCC)  - POCT HgB A1C - Dapagliflozin Pro-metFORMIN ER (XIGDUO XR) 10-500 MG TB24; Take 1 tablet by mouth daily.  Dispense: 90 tablet; Refill: 1 - VASCEPA 1 g capsule; TAKE 2 CAPSULES(2 GRAMS) BY MOUTH TWICE DAILY  Dispense: 360 capsule; Refill: 1 - Comprehensive metabolic panel - Lipid panel - Urine Microalbumin w/creat. ratio  2. Sickle cell trait (HCC)  - CBC with Differential/Platelet  3. Crohn's disease of large intestine without complication ([place])  Doing well on medication  4. Essential hypertension  - amLODipine (NORVASC) 5 MG tablet; TAKE 1 TABLET(5 MG) BY MOUTH DAILY  Dispense: 90 tablet; Refill: 1  5. Chronic insomnia  Doing better   6. OSA on CPAP   7. Low serum vitamin B12  - B12 and Folate Panel  8. Vitamin D deficiency  - VITAMIN D 25 Hydroxy (Vit-D Deficiency, Fractures)  9. Nocturnal muscle cramps  Improved   10. Statin myopathy  Doing well   11. History of hematuria  - Urinalysis, Complete

## 2023-01-06 ENCOUNTER — Ambulatory Visit: Payer: Managed Care, Other (non HMO) | Admitting: Family Medicine

## 2023-01-06 ENCOUNTER — Encounter: Payer: Self-pay | Admitting: Family Medicine

## 2023-01-06 VITALS — BP 122/82 | HR 93 | Resp 16 | Ht 64.0 in | Wt 148.0 lb

## 2023-01-06 DIAGNOSIS — D573 Sickle-cell trait: Secondary | ICD-10-CM

## 2023-01-06 DIAGNOSIS — K5010 Crohn's disease of large intestine without complications: Secondary | ICD-10-CM

## 2023-01-06 DIAGNOSIS — E538 Deficiency of other specified B group vitamins: Secondary | ICD-10-CM

## 2023-01-06 DIAGNOSIS — E785 Hyperlipidemia, unspecified: Secondary | ICD-10-CM | POA: Diagnosis not present

## 2023-01-06 DIAGNOSIS — E559 Vitamin D deficiency, unspecified: Secondary | ICD-10-CM

## 2023-01-06 DIAGNOSIS — R252 Cramp and spasm: Secondary | ICD-10-CM

## 2023-01-06 DIAGNOSIS — E1169 Type 2 diabetes mellitus with other specified complication: Secondary | ICD-10-CM | POA: Diagnosis not present

## 2023-01-06 DIAGNOSIS — Z87448 Personal history of other diseases of urinary system: Secondary | ICD-10-CM

## 2023-01-06 DIAGNOSIS — I10 Essential (primary) hypertension: Secondary | ICD-10-CM | POA: Diagnosis not present

## 2023-01-06 DIAGNOSIS — T466X5A Adverse effect of antihyperlipidemic and antiarteriosclerotic drugs, initial encounter: Secondary | ICD-10-CM | POA: Insufficient documentation

## 2023-01-06 DIAGNOSIS — G720 Drug-induced myopathy: Secondary | ICD-10-CM | POA: Insufficient documentation

## 2023-01-06 DIAGNOSIS — F5104 Psychophysiologic insomnia: Secondary | ICD-10-CM

## 2023-01-06 DIAGNOSIS — G4733 Obstructive sleep apnea (adult) (pediatric): Secondary | ICD-10-CM

## 2023-01-06 LAB — POCT GLYCOSYLATED HEMOGLOBIN (HGB A1C): Hemoglobin A1C: 6.7 % — AB (ref 4.0–5.6)

## 2023-01-06 MED ORDER — VASCEPA 1 G PO CAPS: ORAL_CAPSULE | ORAL | 1 refills | Status: DC

## 2023-01-06 MED ORDER — AMLODIPINE BESYLATE 5 MG PO TABS: ORAL_TABLET | ORAL | 1 refills | Status: DC

## 2023-01-06 MED ORDER — XIGDUO XR 10-500 MG PO TB24: 1.0000 | ORAL_TABLET | Freq: Every day | ORAL | 1 refills | Status: DC

## 2023-01-19 LAB — COMPREHENSIVE METABOLIC PANEL
ALT: 25 IU/L (ref 0–32)
AST: 20 IU/L (ref 0–40)
Albumin/Globulin Ratio: 1.8 (ref 1.2–2.2)
Albumin: 5 g/dL — ABNORMAL HIGH (ref 3.9–4.9)
Alkaline Phosphatase: 97 IU/L (ref 44–121)
BUN/Creatinine Ratio: 15 (ref 12–28)
BUN: 13 mg/dL (ref 8–27)
Bilirubin Total: 0.4 mg/dL (ref 0.0–1.2)
CO2: 27 mmol/L (ref 20–29)
Calcium: 9.9 mg/dL (ref 8.7–10.3)
Chloride: 98 mmol/L (ref 96–106)
Creatinine, Ser: 0.86 mg/dL (ref 0.57–1.00)
Globulin, Total: 2.8 g/dL (ref 1.5–4.5)
Glucose: 87 mg/dL (ref 70–99)
Potassium: 4.2 mmol/L (ref 3.5–5.2)
Sodium: 141 mmol/L (ref 134–144)
Total Protein: 7.8 g/dL (ref 6.0–8.5)
eGFR: 76 mL/min/{1.73_m2} (ref >59)

## 2023-01-19 LAB — CBC WITH DIFFERENTIAL/PLATELET
Basophils Absolute: 0.1 10*3/uL (ref 0.0–0.2)
Basos: 2 %
EOS (ABSOLUTE): 0.3 10*3/uL (ref 0.0–0.4)
Eos: 5 %
Hematocrit: 43.8 % (ref 34.0–46.6)
Hemoglobin: 14.7 g/dL (ref 11.1–15.9)
Immature Grans (Abs): 0 10*3/uL (ref 0.0–0.1)
Immature Granulocytes: 0 %
Lymphocytes Absolute: 3.3 10*3/uL — ABNORMAL HIGH (ref 0.7–3.1)
Lymphs: 49 %
MCH: 24.9 pg — ABNORMAL LOW (ref 26.6–33.0)
MCHC: 33.6 g/dL (ref 31.5–35.7)
MCV: 74 fL — ABNORMAL LOW (ref 79–97)
Monocytes Absolute: 0.4 10*3/uL (ref 0.1–0.9)
Monocytes: 5 %
Neutrophils Absolute: 2.7 10*3/uL (ref 1.4–7.0)
Neutrophils: 39 %
Platelets: 283 10*3/uL (ref 150–450)
RBC: 5.9 x10E6/uL — ABNORMAL HIGH (ref 3.77–5.28)
RDW: 14.9 % (ref 11.7–15.4)
WBC: 6.8 10*3/uL (ref 3.4–10.8)

## 2023-01-19 LAB — MICROSCOPIC EXAMINATION
Bacteria, UA: NONE SEEN
Casts: NONE SEEN /lpf
RBC, Urine: NONE SEEN /hpf (ref 0–2)
WBC, UA: NONE SEEN /hpf (ref 0–5)

## 2023-01-19 LAB — MICROALBUMIN / CREATININE URINE RATIO
Creatinine, Urine: 38.7 mg/dL
Microalb/Creat Ratio: <8 mg/g creat (ref 0–29)
Microalbumin, Urine: <3 ug/mL

## 2023-01-19 LAB — URINALYSIS, COMPLETE
Bilirubin, UA: NEGATIVE
Ketones, UA: NEGATIVE
Leukocytes,UA: NEGATIVE
Nitrite, UA: NEGATIVE
Protein,UA: NEGATIVE
RBC, UA: NEGATIVE
Specific Gravity, UA: 1.014 (ref 1.005–1.030)
Urobilinogen, Ur: 0.2 mg/dL (ref 0.2–1.0)
pH, UA: 7 (ref 5.0–7.5)

## 2023-01-19 LAB — B12 AND FOLATE PANEL
Folate: >20 ng/mL (ref >3.0)
Vitamin B-12: >2000 pg/mL — ABNORMAL HIGH (ref 232–1245)

## 2023-01-19 LAB — LIPID PANEL
Chol/HDL Ratio: 3.4 ratio (ref 0.0–4.4)
Cholesterol, Total: 257 mg/dL — ABNORMAL HIGH (ref 100–199)
HDL: 75 mg/dL (ref >39)
LDL Chol Calc (NIH): 147 mg/dL — ABNORMAL HIGH (ref 0–99)
Triglycerides: 195 mg/dL — ABNORMAL HIGH (ref 0–149)
VLDL Cholesterol Cal: 35 mg/dL (ref 5–40)

## 2023-01-19 LAB — VITAMIN D 25 HYDROXY (VIT D DEFICIENCY, FRACTURES): Vit D, 25-Hydroxy: 62.9 ng/mL (ref 30.0–100.0)

## 2023-01-22 ENCOUNTER — Other Ambulatory Visit: Payer: Self-pay | Admitting: Internal Medicine

## 2023-01-22 DIAGNOSIS — I10 Essential (primary) hypertension: Secondary | ICD-10-CM

## 2023-01-22 DIAGNOSIS — R002 Palpitations: Secondary | ICD-10-CM

## 2023-01-24 ENCOUNTER — Ambulatory Visit
Admission: RE | Admit: 2023-01-24 | Discharge: 2023-01-24 | Disposition: A | Discharge status: Home / Self Care | Payer: Managed Care, Other (non HMO) | Source: Ambulatory Visit | Attending: Internal Medicine | Admitting: Internal Medicine

## 2023-01-24 DIAGNOSIS — R002 Palpitations: Secondary | ICD-10-CM

## 2023-01-24 DIAGNOSIS — I10 Essential (primary) hypertension: Secondary | ICD-10-CM | POA: Insufficient documentation

## 2023-02-13 LAB — HM DIABETES EYE EXAM

## 2023-02-21 ENCOUNTER — Other Ambulatory Visit: Payer: Self-pay | Admitting: Family Medicine

## 2023-02-21 DIAGNOSIS — E1169 Type 2 diabetes mellitus with other specified complication: Secondary | ICD-10-CM

## 2023-03-10 NOTE — Progress Notes (Unsigned)
Name: Samantha Stephens   MRN: [id number]    DOB: 01/30/1961   Date:03/11/2023       Progress Note  Subjective  Chief Complaint  Annual Exam  HPI  Patient presents for annual CPE.  Diet: she eats a balanced diet  Exercise: continue regular physical activity  Last Eye Exam: up to date  Last Dental Exam: up to date   Flowsheet Row Office Visit from 03/11/2023 in [facility]  AUDIT-C Score 2      Depression: Phq 9 is  negative    03/11/2023    9:32 AM 01/06/2023    8:15 AM 09/17/2022    7:48 AM 06/26/2022    2:30 PM 05/24/2022    2:27 PM  Depression screen PHQ 2/9  Decreased Interest 0 0 0 0 0  Down, Depressed, Hopeless 0 0 0 0 0  PHQ - 2 Score 0 0 0 0 0  Altered sleeping 0 0 0 0   Tired, decreased energy 0 0 0 0   Change in appetite 0 0 0 0   Feeling bad or failure about yourself  0 0 0 0   Trouble concentrating 0 0 0 0   Moving slowly or fidgety/restless 0 0 0 0   Suicidal thoughts 0 0 0 0   PHQ-9 Score 0 0 0 0    Hypertension: BP Readings from Last 3 Encounters:  03/11/23 118/78  01/06/23 122/82  11/22/22 120/82   Obesity: Wt Readings from Last 3 Encounters:  03/11/23 148 lb 11.2 oz (67.4 kg)  01/06/23 148 lb (67.1 kg)  11/22/22 145 lb 9.6 oz (66 kg)   BMI Readings from Last 3 Encounters:  03/11/23 25.93 kg/m  01/06/23 25.40 kg/m  11/22/22 24.99 kg/m     Vaccines:   Tdap: up to date Shingrix: up to date Pneumonia: up to date Flu: 2022 COVID-19: up to date   Hep C Screening: 06/20/21 STD testing and prevention (HIV/chl/gon/syphilis): 12/05/21 Intimate partner violence: negative screen  Sexual History : not sexually active now  Menstrual History/LMP/Abnormal Bleeding:s/p hysterectomy  Discussed importance of follow up if any post-menopausal bleeding: yes  Incontinence Symptoms: negative for symptoms   Breast cancer:  - Last Mammogram: 09/23/22 - BRCA gene screening: N/A  Osteoporosis Prevention : Discussed high calcium  and vitamin D supplementation, weight bearing exercises Bone density: 02/19/22   Cervical cancer screening: N/A  Skin cancer: Discussed monitoring for atypical lesions  Colorectal cancer: 11/22/22   Lung cancer:  Low Dose CT Chest recommended if Age 75-80 years, 20 pack-year currently smoking OR have quit w/in 15years. Patient does not qualify for screen   ECG: 06/12/22  Advanced Care Planning: A voluntary discussion about advance care planning including the explanation and discussion of advance directives.  Discussed health care proxy and Living will, and the patient was able to identify a health care proxy as daughter .  Patient has a living will and power of attorney of health care   Lipids: Lab Results  Component Value Date   CHOL 257 (H) 01/16/2023   CHOL 197 05/03/2022   CHOL 187 11/29/2021   Lab Results  Component Value Date   HDL 75 01/16/2023   HDL 78 05/03/2022   HDL 61 11/29/2021   Lab Results  Component Value Date   LDLCALC 147 (H) 01/16/2023   LDLCALC 108 (H) 05/03/2022   LDLCALC 97 11/29/2021   Lab Results  Component Value Date   TRIG 195 (H) 01/16/2023  TRIG 60 05/03/2022   TRIG 171 (H) 11/29/2021   Lab Results  Component Value Date   CHOLHDL 3.4 01/16/2023   CHOLHDL 2.5 05/03/2022   CHOLHDL 3.1 11/29/2021   No results found for: "LDLDIRECT"  Glucose: Glucose  Date Value Ref Range Status  01/16/2023 87 70 - 99 mg/dL Final  40/98/1191 478 (H) 70 - 99 mg/dL Final  29/56/2130 865 (H) 70 - 99 mg/dL Final  78/46/9629 528 (H) 65 - 99 mg/dL Final   Glucose, Bld  Date Value Ref Range Status  06/11/2022 228 (H) 70 - 99 mg/dL Final    Comment:    Glucose reference range applies only to samples taken after fasting for at least 8 hours.  03/19/2019 77 65 - 99 mg/dL Final    Comment:    .            Fasting reference interval .   02/20/2019 136 (H) 70 - 99 mg/dL Final   Glucose-Capillary  Date Value Ref Range Status  11/22/2022 123 (H) 70 - 99 mg/dL  Final    Comment:    Glucose reference range applies only to samples taken after fasting for at least 8 hours.  06/18/2021 129 (H) 70 - 99 mg/dL Final    Comment:    Glucose reference range applies only to samples taken after fasting for at least 8 hours.  11/21/2016 77 65 - 99 mg/dL Final    Patient Active Problem List   Diagnosis Date Noted   Statin myopathy 01/06/2023   Crohn's colitis, without complications (HCC) 11/22/2022   Sickle cell trait (HCC) 05/06/2022   Crohn's disease of large intestine without complication (HCC) 02/20/2022   Trochanteric bursitis of right hip 10/18/2021   CPAP use counseling 08/14/2020   Insomnia related to another mental disorder    Vitamin D deficiency 01/07/2020   Low serum vitamin B12 01/07/2020   Osteoarthritis of hip 11/12/2019   DDD (degenerative disc disease), cervical 04/19/2019   Hyperlipidemia LDL goal <70 12/16/2018   Occipital headache 09/01/2018   Dyslipidemia associated with type 2 diabetes mellitus (HCC) 04/22/2018   OSA on CPAP 09/15/2017   Chronic hip pain, right 09/09/2016   Numbness of right foot 09/09/2016   Scoliosis 03/08/2016   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   Situational anxiety 10/27/2015   Essential hypertension 08/18/2015   Chronic insomnia 08/18/2015    Past Surgical History:  Procedure Laterality Date   ABDOMINAL HYSTERECTOMY     CESAREAN SECTION     1   COLONOSCOPY     COLONOSCOPY WITH PROPOFOL N/A 06/18/2021   Procedure: COLONOSCOPY WITH PROPOFOL;  Surgeon: Toney Reil, MD;  Location: [facility] ENDOSCOPY;  Service: Gastroenterology;  Laterality: N/A;   COLONOSCOPY WITH PROPOFOL N/A 11/22/2022   Procedure: COLONOSCOPY WITH PROPOFOL;  Surgeon: Toney Reil, MD;  Location: [facility] ENDOSCOPY;  Service: Gastroenterology;  Laterality: N/A;   CYSTOSCOPY W/ RETROGRADES Bilateral 11/25/2016   Procedure: CYSTOSCOPY WITH RETROGRADE PYELOGRAM;  Surgeon: Vanna Scotland, MD;  Location: [facility] ORS;   Service: Urology;  Laterality: Bilateral;   EYE SURGERY     lasix eye surgery    Family History  Problem Relation Age of Onset   Arthritis Mother    COPD Mother    Depression Mother    Diabetes Mother    Hyperlipidemia Mother    Hypertension Mother    Cataracts Mother    Heart disease Mother    Hearing loss Father    Kidney disease Father  Cataracts Father    Cancer - Prostate Father        kidney and prostate cancer, colon cancer   Asthma Sister    Cancer Paternal Grandmother    Mental illness Sister    Breast cancer Other     Social History   Socioeconomic History   Marital status: Widowed    Spouse name: Not on file   Number of children: 1   Years of education: Not on file   Highest education level: Bachelor's degree (e.g., BA, AB, BS)  Occupational History   Occupation: IT   Tobacco Use   Smoking status: Former    Types: Cigarettes    Quit date: 1990    Years since quitting: 34.4   Smokeless tobacco: Never   Tobacco comments:    but only for a short amount of time  Vaping Use   Vaping Use: Never used  Substance and Sexual Activity   Alcohol use: Yes    Alcohol/week: 0.0 - 1.0 standard drinks of alcohol    Comment: occasional glass of wine   Drug use: No   Sexual activity: Yes    Partners: Male  Other Topics Concern   Not on file  Social History Narrative   Not on file   Social Determinants of Health   Financial Resource Strain: Low Risk  (03/11/2023)   Overall Financial Resource Strain (CARDIA)    Difficulty of Paying Living Expenses: Not hard at all  Food Insecurity: No Food Insecurity (03/11/2023)   Hunger Vital Sign    Worried About Running Out of Food in the Last Year: Never true    Ran Out of Food in the Last Year: Never true  Transportation Needs: No Transportation Needs (03/11/2023)   PRAPARE - Administrator, Civil Service (Medical): No    Lack of Transportation (Non-Medical): No  Physical Activity: Sufficiently Active  (03/11/2023)   Exercise Vital Sign    Days of Exercise per Week: 4 days    Minutes of Exercise per Session: 60 min  Stress: No Stress Concern Present (03/11/2023)   [employer] of Occupational Health - Occupational Stress Questionnaire    Feeling of Stress : Only a little  Social Connections: Moderately Integrated (03/11/2023)   Social Connection and Isolation Panel [NHANES]    Frequency of Communication with Friends and Family: More than three times a week    Frequency of Social Gatherings with Friends and Family: More than three times a week    Attends Religious Services: More than 4 times per year    Active Member of [employer] or Organizations: Yes    Attends Banker Meetings: More than 4 times per year    Marital Status: Widowed  Intimate Partner Violence: Not At Risk (03/11/2023)   Humiliation, Afraid, Rape, and Kick questionnaire    Fear of Current or Ex-Partner: No    Emotionally Abused: No    Physically Abused: No    Sexually Abused: No     Current Outpatient Medications:    amLODipine (NORVASC) 5 MG tablet, TAKE 1 TABLET(5 MG) BY MOUTH DAILY, Disp: 90 tablet, Rfl: 1   Cholecalciferol (VITAMIN D3) 2000 units TABS, Take 1 capsule by mouth daily., Disp: , Rfl:    Dapagliflozin Pro-metFORMIN ER (XIGDUO XR) 10-500 MG TB24, Take 1 tablet by mouth daily., Disp: 90 tablet, Rfl: 1   fluconazole (DIFLUCAN) 150 MG tablet, Take 1 tablet (150 mg total) by mouth every other day. For three doses weekly prn,  Disp: 12 tablet, Rfl: 0   Multiple Vitamin (MULTIVITAMIN) tablet, Take 1 tablet by mouth daily., Disp: , Rfl:    neomycin-polymyxin b-dexamethasone (MAXITROL) 3.5-10000-0.1 SUSP, 1 drop 4 (four) times daily., Disp: , Rfl:    triamcinolone cream (KENALOG) 0.1 %, Apply 1 application topically 2 (two) times daily., Disp: 30 g, Rfl: 1   VASCEPA 1 g capsule, TAKE 2 CAPSULES(2 GRAMS) BY MOUTH TWICE DAILY, Disp: 360 capsule, Rfl: 1   vedolizumab (ENTYVIO) 300 MG injection, Inject  300 mg into the vein every 8 (eight) weeks., Disp: , Rfl:   Allergies  Allergen Reactions   Trazodone And Nefazodone Other (See Comments)    Syncope; see in ER Feb 2018   Benicar [Olmesartan] Cough   Statins      ROS  Ten systems reviewed and is negative except as mentioned in HPI   Objective  Vitals:   03/11/23 0921  BP: 118/78  Pulse: 88  Resp: 14  Temp: 98.2 F (36.8 C)  TempSrc: Oral  SpO2: 98%  Weight: 148 lb 11.2 oz (67.4 kg)  Height: 5' 3.5" (1.613 m)    Body mass index is 25.93 kg/m.  Physical Exam  Constitutional: Patient appears well-developed and well-nourished. No distress.  HENT: Head: Normocephalic and atraumatic. Ears: B TMs ok, no erythema or effusion; Nose: Nose normal. Mouth/Throat: Oropharynx is clear and moist. No oropharyngeal exudate.  Eyes: Conjunctivae and EOM are normal. Pupils are equal, round, and reactive to light. No scleral icterus.  Neck: Normal range of motion. Neck supple. No JVD present. No thyromegaly present.  Cardiovascular: Normal rate, regular rhythm and normal heart sounds.  No murmur heard. No BLE edema. Pulmonary/Chest: Effort normal and breath sounds normal. No respiratory distress. Abdominal: Soft. Bowel sounds are normal, no distension. There is no tenderness. no masses Breast: no lumps or masses, no nipple discharge or rashes FEMALE GENITALIA:  External genitalia normal External urethra normal Vaginal vault normal without discharge or lesions Cervix not present  Bimanual exam normal without masses RECTAL: normal external exam  Musculoskeletal: Normal range of motion, no joint effusions. No gross deformities Neurological: he is alert and oriented to person, place, and time. No cranial nerve deficit. Coordination, balance, strength, speech and gait are normal.  Skin: Skin is warm and dry. No rash noted. No erythema.  Psychiatric: Patient has a normal mood and affect. behavior is normal. Judgment and thought content  normal.   Recent Results (from the past 2160 hour(s))  POCT HgB A1C     Status: Abnormal   Collection Time: 01/06/23  8:18 AM  Result Value Ref Range   Hemoglobin A1C 6.7 (A) 4.0 - 5.6 %   HbA1c POC (<> result, manual entry)     HbA1c, POC (prediabetic range)     HbA1c, POC (controlled diabetic range)    CBC with Differential/Platelet     Status: Abnormal   Collection Time: 01/16/23  1:51 PM  Result Value Ref Range   WBC 6.8 3.4 - 10.8 x10E3/uL   RBC 5.90 (H) 3.77 - 5.28 x10E6/uL   Hemoglobin 14.7 11.1 - 15.9 g/dL   Hematocrit 65.7 84.6 - 46.6 %   MCV 74 (L) 79 - 97 fL   MCH 24.9 (L) 26.6 - 33.0 pg   MCHC 33.6 31.5 - 35.7 g/dL   RDW 96.2 95.2 - 84.1 %   Platelets 283 150 - 450 x10E3/uL   Neutrophils 39 Not Estab. %   Lymphs 49 Not Estab. %   Monocytes 5  Not Estab. %   Eos 5 Not Estab. %   Basos 2 Not Estab. %   Neutrophils Absolute 2.7 1.4 - 7.0 x10E3/uL   Lymphocytes Absolute 3.3 (H) 0.7 - 3.1 x10E3/uL   Monocytes Absolute 0.4 0.1 - 0.9 x10E3/uL   EOS (ABSOLUTE) 0.3 0.0 - 0.4 x10E3/uL   Basophils Absolute 0.1 0.0 - 0.2 x10E3/uL   Immature Granulocytes 0 Not Estab. %   Immature Grans (Abs) 0.0 0.0 - 0.1 x10E3/uL  B12 and Folate Panel     Status: Abnormal   Collection Time: 01/16/23  1:51 PM  Result Value Ref Range   Vitamin B-12 >2000 (H) 232 - 1245 pg/mL   Folate >20.0 >3.0 ng/mL    Comment: A serum folate concentration of less than 3.1 ng/mL is considered to represent clinical deficiency.   Comprehensive metabolic panel     Status: Abnormal   Collection Time: 01/16/23  1:51 PM  Result Value Ref Range   Glucose 87 70 - 99 mg/dL   BUN 13 8 - 27 mg/dL   Creatinine, Ser 7.82 0.57 - 1.00 mg/dL   eGFR 76 >95 AO/ZHY/8.65   BUN/Creatinine Ratio 15 12 - 28   Sodium 141 134 - 144 mmol/L   Potassium 4.2 3.5 - 5.2 mmol/L   Chloride 98 96 - 106 mmol/L   CO2 27 20 - 29 mmol/L   Calcium 9.9 8.7 - 10.3 mg/dL   Total Protein 7.8 6.0 - 8.5 g/dL   Albumin 5.0 (H) 3.9 - 4.9 g/dL    Globulin, Total 2.8 1.5 - 4.5 g/dL   Albumin/Globulin Ratio 1.8 1.2 - 2.2   Bilirubin Total 0.4 0.0 - 1.2 mg/dL   Alkaline Phosphatase 97 44 - 121 IU/L   AST 20 0 - 40 IU/L   ALT 25 0 - 32 IU/L  Lipid panel     Status: Abnormal   Collection Time: 01/16/23  1:51 PM  Result Value Ref Range   Cholesterol, Total 257 (H) 100 - 199 mg/dL   Triglycerides 784 (H) 0 - 149 mg/dL   HDL 75 >69 mg/dL   VLDL Cholesterol Cal 35 5 - 40 mg/dL   LDL Chol Calc (NIH) 629 (H) 0 - 99 mg/dL   Chol/HDL Ratio 3.4 0.0 - 4.4 ratio    Comment:                                   T. Chol/HDL Ratio                                             Men  Women                               1/2 Avg.Risk  3.4    3.3                                   Avg.Risk  5.0    4.4                                2X Avg.Risk  9.6    7.1  3X Avg.Risk 23.4   11.0   Urinalysis, Complete     Status: Abnormal   Collection Time: 01/16/23  1:51 PM  Result Value Ref Range   Specific Gravity, UA 1.014 1.005 - 1.030   pH, UA 7.0 5.0 - 7.5   Color, UA Yellow Yellow   Appearance Ur Clear Clear   Leukocytes,UA Negative Negative   Protein,UA Negative Negative/Trace   Glucose, UA 3+ (A) Negative   Ketones, UA Negative Negative   RBC, UA Negative Negative   Bilirubin, UA Negative Negative   Urobilinogen, Ur 0.2 0.2 - 1.0 mg/dL   Nitrite, UA Negative Negative   Microscopic Examination Comment     Comment: Microscopic follows if indicated.   Microscopic Examination See below:     Comment: Microscopic was indicated and was performed.  Urine Microalbumin w/creat. ratio     Status: None   Collection Time: 01/16/23  1:51 PM  Result Value Ref Range   Creatinine, Urine 38.7 Not Estab. mg/dL   Microalbumin, Urine <1.4 Not Estab. ug/mL   Microalb/Creat Ratio <8 0 - 29 mg/g creat    Comment:                        Normal:                0 -  29                        Moderately increased: 30 - 300                         Severely increased:       >300   VITAMIN D 25 Hydroxy (Vit-D Deficiency, Fractures)     Status: None   Collection Time: 01/16/23  1:51 PM  Result Value Ref Range   Vit D, 25-Hydroxy 62.9 30.0 - 100.0 ng/mL    Comment: Vitamin D deficiency has been defined by the Institute of Medicine and an Endocrine Society practice guideline as a level of serum 25-OH vitamin D less than 20 ng/mL (1,2). The Endocrine Society went on to further define vitamin D insufficiency as a level between 21 and 29 ng/mL (2). 1. IOM (Institute of Medicine). 2010. Dietary reference    intakes for calcium and D. [place]: The    [employer]. 2. Holick MF, Binkley Bishop, Bischoff-Ferrari HA, et al.    Evaluation, treatment, and prevention of vitamin D    deficiency: an Endocrine Society clinical practice    guideline. JCEM. 2011 Jul; 96(7):1911-30.   Microscopic Examination     Status: None   Collection Time: 01/16/23  1:51 PM  Result Value Ref Range   WBC, UA None seen 0 - 5 /hpf   RBC, Urine None seen 0 - 2 /hpf   Epithelial Cells (non renal) 0-10 0 - 10 /hpf   Casts None seen None seen /lpf   Bacteria, UA None seen None seen/Few     Fall Risk:    03/11/2023    9:32 AM 01/06/2023    8:15 AM 09/17/2022    7:48 AM 06/26/2022    2:30 PM 05/24/2022    2:27 PM  Fall Risk   Falls in the past year? 0 0 0 0 0  Number falls in past yr:  0 0  0  Injury with Fall?  0 0  0  Risk for fall due to :  No Fall Risks No Fall Risks No Fall Risks  No Fall Risks  Follow up Falls prevention discussed;Education provided;Falls evaluation completed Falls prevention discussed Falls prevention discussed Falls prevention discussed;Education provided;Falls evaluation completed Falls prevention discussed     Functional Status Survey: Is the patient deaf or have difficulty hearing?: No Does the patient have difficulty seeing, even when wearing glasses/contacts?: No Does the patient have difficulty concentrating,  remembering, or making decisions?: No Does the patient have difficulty walking or climbing stairs?: No Does the patient have difficulty dressing or bathing?: No Does the patient have difficulty doing errands alone such as visiting a doctor's office or shopping?: No   Assessment & Plan  1. Well adult exam   2. Breast cancer screening by mammogram  - MM 3D SCREENING MAMMOGRAM BILATERAL BREAST; Future    -USPSTF grade A and B recommendations reviewed with patient; age-appropriate recommendations, preventive care, screening tests, etc discussed and encouraged; healthy living encouraged; see AVS for patient education given to patient -Discussed importance of 150 minutes of physical activity weekly, eat two servings of fish weekly, eat one serving of tree nuts ( cashews, pistachios, pecans, almonds.Marland Kitchen) every other day, eat 6 servings of fruit/vegetables daily and drink plenty of water and avoid sweet beverages.   -Reviewed Health Maintenance: Yes.

## 2023-03-10 NOTE — Patient Instructions (Signed)
Preventive Care 40-64 Years Old, Female Preventive care refers to lifestyle choices and visits with your health care provider that can promote health and wellness. Preventive care visits are also called wellness exams. What can I expect for my preventive care visit? Counseling Your health care provider may ask you questions about your: Medical history, including: Past medical problems. Family medical history. Pregnancy history. Current health, including: Menstrual cycle. Method of birth control. Emotional well-being. Home life and relationship well-being. Sexual activity and sexual health. Lifestyle, including: Alcohol, nicotine or tobacco, and drug use. Access to firearms. Diet, exercise, and sleep habits. Work and work environment. Sunscreen use. Safety issues such as seatbelt and bike helmet use. Physical exam Your health care provider will check your: Height and weight. These may be used to calculate your BMI (body mass index). BMI is a measurement that tells if you are at a healthy weight. Waist circumference. This measures the distance around your waistline. This measurement also tells if you are at a healthy weight and may help predict your risk of certain diseases, such as type 2 diabetes and high blood pressure. Heart rate and blood pressure. Body temperature. Skin for abnormal spots. What immunizations do I need?  Vaccines are usually given at various ages, according to a schedule. Your health care provider will recommend vaccines for you based on your age, medical history, and lifestyle or other factors, such as travel or where you work. What tests do I need? Screening Your health care provider may recommend screening tests for certain conditions. This may include: Lipid and cholesterol levels. Diabetes screening. This is done by checking your blood sugar (glucose) after you have not eaten for a while (fasting). Pelvic exam and Pap test. Hepatitis B test. Hepatitis C  test. HIV (human immunodeficiency virus) test. STI (sexually transmitted infection) testing, if you are at risk. Lung cancer screening. Colorectal cancer screening. Mammogram. Talk with your health care provider about when you should start having regular mammograms. This may depend on whether you have a family history of breast cancer. BRCA-related cancer screening. This may be done if you have a family history of breast, ovarian, tubal, or peritoneal cancers. Bone density scan. This is done to screen for osteoporosis. Talk with your health care provider about your test results, treatment options, and if necessary, the need for more tests. Follow these instructions at home: Eating and drinking  Eat a diet that includes fresh fruits and vegetables, whole grains, lean protein, and low-fat dairy products. Take vitamin and mineral supplements as recommended by your health care provider. Do not drink alcohol if: Your health care provider tells you not to drink. You are pregnant, may be pregnant, or are planning to become pregnant. If you drink alcohol: Limit how much you have to 0-1 drink a day. Know how much alcohol is in your drink. In the [place] one drink equals one 12 oz bottle of beer (355 mL), one 5 oz glass of wine (148 mL), or one 1 oz glass of hard liquor (44 mL). Lifestyle Brush your teeth every morning and night with fluoride toothpaste. Floss one time each day. Exercise for at least 30 minutes 5 or more days each week. Do not use any products that contain nicotine or tobacco. These products include cigarettes, chewing tobacco, and vaping devices, such as e-cigarettes. If you need help quitting, ask your health care provider. Do not use drugs. If you are sexually active, practice safe sex. Use a condom or other form of protection to   prevent STIs. If you do not wish to become pregnant, use a form of birth control. If you plan to become pregnant, see your health care provider for a  prepregnancy visit. Take aspirin only as told by your health care provider. Make sure that you understand how much to take and what form to take. Work with your health care provider to find out whether it is safe and beneficial for you to take aspirin daily. Find healthy ways to manage stress, such as: Meditation, yoga, or listening to music. Journaling. Talking to a trusted person. Spending time with friends and family. Minimize exposure to UV radiation to reduce your risk of skin cancer. Safety Always wear your seat belt while driving or riding in a vehicle. Do not drive: If you have been drinking alcohol. Do not ride with someone who has been drinking. When you are tired or distracted. While texting. If you have been using any mind-altering substances or drugs. Wear a helmet and other protective equipment during sports activities. If you have firearms in your house, make sure you follow all gun safety procedures. Seek help if you have been physically or sexually abused. What's next? Visit your health care provider once a year for an annual wellness visit. Ask your health care provider how often you should have your eyes and teeth checked. Stay up to date on all vaccines. This information is not intended to replace advice given to you by your health care provider. Make sure you discuss any questions you have with your health care provider. Document Revised: 04/04/2021 Document Reviewed: 04/04/2021 Elsevier Patient Education  2023 Elsevier Inc.  

## 2023-03-11 ENCOUNTER — Ambulatory Visit (INDEPENDENT_AMBULATORY_CARE_PROVIDER_SITE_OTHER): Payer: Managed Care, Other (non HMO) | Admitting: Family Medicine

## 2023-03-11 ENCOUNTER — Encounter: Payer: Self-pay | Admitting: Family Medicine

## 2023-03-11 VITALS — BP 118/78 | HR 88 | Temp 98.2°F | Resp 14 | Ht 63.5 in | Wt 148.7 lb

## 2023-03-11 DIAGNOSIS — Z1231 Encounter for screening mammogram for malignant neoplasm of breast: Secondary | ICD-10-CM

## 2023-03-11 DIAGNOSIS — Z0000 Encounter for general adult medical examination without abnormal findings: Secondary | ICD-10-CM

## 2023-03-18 ENCOUNTER — Telehealth: Payer: Self-pay

## 2023-03-18 NOTE — Telephone Encounter (Signed)
Per Samantha Stephens with [facility] Patient received Entyvio 300mg  IV on 02/27/23, and she tolerated infusion without adverse reactions or side effects. She denies new complications or decline since previous infusion. Infusion was delayed by one week due to patient request/work scheduled. POT and meds reviewed with patient. Next infusion due in 8 weeks and planned for week of 04/14/2023

## 2023-03-22 ENCOUNTER — Other Ambulatory Visit: Payer: Self-pay | Admitting: Family Medicine

## 2023-03-22 DIAGNOSIS — I10 Essential (primary) hypertension: Secondary | ICD-10-CM

## 2023-04-11 NOTE — Progress Notes (Signed)
[facility] [address] [place], [place] [address]  Pulmonary Sleep Medicine   Office Visit Note  Patient Name: Samantha Stephens DOB: November 05, 1960 MRN [id number]    Chief Complaint: Obstructive Sleep Apnea visit  Brief History:  Rorie is seen today for an annual follow up visit on APAP@ 4-20 cmH2O. The patient has a 16 year history of sleep apnea. Patient is using PAP nightly.  The patient feels rested after sleeping with PAP.  The patient reports benefiting from PAP use. Reported sleepiness is  improved and the Epworth Sleepiness Score is 5 out of 24. The patient will occasionally take naps. The patient complains of the following: pt is in need of a new water chamber.  The compliance download shows 77% compliance with an average use time of 5 hours 17 minutes. The AHI is 0.4.  The patient does not complain of limb movements disrupting sleep. The patient continues to require PAP therapy in order to eliminate sleep apnea.   ROS  General: (-) fever, (-) chills, (-) night sweat Nose and Sinuses: (-) nasal stuffiness or itchiness, (-) postnasal drip, (-) nosebleeds, (-) sinus trouble. Mouth and Throat: (-) sore throat, (-) hoarseness. Neck: (-) swollen glands, (-) enlarged thyroid, (-) neck pain. Respiratory: - cough, - shortness of breath, - wheezing. Neurologic: - numbness, - tingling. Psychiatric: - anxiety, - depression   Current Medication: Outpatient Encounter Medications as of 04/14/2023  Medication Sig Note   amLODipine (NORVASC) 5 MG tablet TAKE 1 TABLET(5 MG) BY MOUTH DAILY    Cholecalciferol (VITAMIN D3) 2000 units TABS Take 1 capsule by mouth daily.    Dapagliflozin Pro-metFORMIN ER (XIGDUO XR) 10-500 MG TB24 Take 1 tablet by mouth daily.    fluconazole (DIFLUCAN) 150 MG tablet Take 1 tablet (150 mg total) by mouth every other day. For three doses weekly prn 03/11/2023: PRN   Multiple Vitamin (MULTIVITAMIN) tablet Take 1 tablet by mouth daily. 03/11/2023: Cut  back due to high levels   neomycin-polymyxin b-dexamethasone (MAXITROL) 3.5-10000-0.1 SUSP 1 drop 4 (four) times daily.    triamcinolone cream (KENALOG) 0.1 % Apply 1 application topically 2 (two) times daily.    VASCEPA 1 g capsule TAKE 2 CAPSULES(2 GRAMS) BY MOUTH TWICE DAILY    vedolizumab (ENTYVIO) 300 MG injection Inject 300 mg into the vein every 8 (eight) weeks.    No facility-administered encounter medications on file as of 04/14/2023.    Surgical History: Past Surgical History:  Procedure Laterality Date   ABDOMINAL HYSTERECTOMY     CESAREAN SECTION     1   COLONOSCOPY     COLONOSCOPY WITH PROPOFOL N/A 06/18/2021   Procedure: COLONOSCOPY WITH PROPOFOL;  Surgeon: Toney Reil, MD;  Location: [facility] ENDOSCOPY;  Service: Gastroenterology;  Laterality: N/A;   COLONOSCOPY WITH PROPOFOL N/A 11/22/2022   Procedure: COLONOSCOPY WITH PROPOFOL;  Surgeon: Toney Reil, MD;  Location[facility] ENDOSCOPY;  Service: Gastroenterology;  Laterality: N/A;   CYSTOSCOPY W/ RETROGRADES Bilateral 11/25/2016   Procedure: CYSTOSCOPY WITH RETROGRADE PYELOGRAM;  Surgeon: Vanna Scotland, MD;  Location: [facility] ORS;  Service: Urology;  Laterality: Bilateral;   EYE SURGERY     lasix eye surgery    Medical History: 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   Diabetes mellitus without complication (HCC)    Family hx of colon cancer requiring screening colonoscopy 03/08/2016   Headache    Hyperlipidemia  Hypertension    Hypokalemia    Insomnia related to another mental disorder    Palpitations    Scoliosis 03/08/2016   Sleep apnea    Stress headaches     Family History: Non contributory to the present illness  Social History: Social History   Socioeconomic History   Marital status: Widowed    Spouse name: Not on file   Number of children: 1   Years of education: Not on file   Highest  education level: Bachelor's degree (e.g., BA, AB, BS)  Occupational History   Occupation: IT   Tobacco Use   Smoking status: Former    Types: Cigarettes    Quit date: 1990    Years since quitting: 34.5   Smokeless tobacco: Never   Tobacco comments:    but only for a short amount of time  Vaping Use   Vaping Use: Never used  Substance and Sexual Activity   Alcohol use: Yes    Alcohol/week: 0.0 - 1.0 standard drinks of alcohol    Comment: occasional glass of wine   Drug use: No   Sexual activity: Yes    Partners: Male  Other Topics Concern   Not on file  Social History Narrative   Not on file   Social Determinants of Health   Financial Resource Strain: Low Risk  (03/11/2023)   Overall Financial Resource Strain (CARDIA)    Difficulty of Paying Living Expenses: Not hard at all  Food Insecurity: No Food Insecurity (03/11/2023)   Hunger Vital Sign    Worried About Running Out of Food in the Last Year: Never true    Ran Out of Food in the Last Year: Never true  Transportation Needs: No Transportation Needs (03/11/2023)   PRAPARE - Administrator, Civil Service (Medical): No    Lack of Transportation (Non-Medical): No  Physical Activity: Sufficiently Active (03/11/2023)   Exercise Vital Sign    Days of Exercise per Week: 4 days    Minutes of Exercise per Session: 60 min  Stress: No Stress Concern Present (03/11/2023)   [employer] of Occupational Health - Occupational Stress Questionnaire    Feeling of Stress : Only a little  Social Connections: Moderately Integrated (03/11/2023)   Social Connection and Isolation Panel [NHANES]    Frequency of Communication with Friends and Family: More than three times a week    Frequency of Social Gatherings with Friends and Family: More than three times a week    Attends Religious Services: More than 4 times per year    Active Member of [employer] or Organizations: Yes    Attends Banker Meetings: More than 4 times per  year    Marital Status: Widowed  Intimate Partner Violence: Not At Risk (03/11/2023)   Humiliation, Afraid, Rape, and Kick questionnaire    Fear of Current or Ex-Partner: No    Emotionally Abused: No    Physically Abused: No    Sexually Abused: No    Vital Signs: Blood pressure 129/84, pulse 78, resp. rate 16, height 5\' 3"  (1.6 m), weight 149 lb (67.6 kg), SpO2 97 %. Body mass index is 26.39 kg/m.    Examination: General Appearance: The patient is well-developed, well-nourished, and in no distress. Neck Circumference: 37.5 cm Skin: Gross inspection of skin unremarkable. Head: normocephalic, no gross deformities. Eyes: no gross deformities noted. ENT: ears appear grossly normal Neurologic: Alert and oriented. No involuntary movements.  STOP BANG RISK ASSESSMENT S (snore) Have you been  told that you snore?     NO   T (tired) Are you often tired, fatigued, or sleepy during the day?   NO  O (obstruction) Do you stop breathing, choke, or gasp during sleep? NO   P (pressure) Do you have or are you being treated for high blood pressure? YES   B (BMI) Is your body index greater than 35 kg/m? NO   A (age) Are you 37 years old or older? YES   N (neck) Do you have a neck circumference greater than 16 inches?   NO   G (gender) Are you a female? NO   TOTAL STOP/BANG "YES" ANSWERS 2       A STOP-Bang score of 2 or less is considered low risk, and a score of 5 or more is high risk for having either moderate or severe OSA. For people who score 3 or 4, doctors may need to perform further assessment to determine how likely they are to have OSA.         EPWORTH SLEEPINESS SCALE:  Scale:  (0)= no chance of dozing; (1)= slight chance of dozing; (2)= moderate chance of dozing; (3)= high chance of dozing  Chance  Situtation    Sitting and reading: 1    Watching TV: 1    Sitting Inactive in public: 0    As a passenger in car: 1      Lying down to rest: 1    Sitting and talking:  0    Sitting quielty after lunch: 1    In a car, stopped in traffic: 0   TOTAL SCORE:   5 out of 24    SLEEP STUDIES:  PSG (04/14/07) AHI 10, min SPO2 84%   CPAP COMPLIANCE DATA:  Date Range: 04/10/2022-04/09/2023  Average Daily Use: 5 hours 17 minutes  Median Use: 5 hours 34 minutes  Compliance for > 4 Hours: 77%  AHI: 0.4 respiratory events per hour  Days Used: 359/365 days  Mask Leak: 18.9  95th Percentile Pressure: 11.5         LABS: Recent Results (from the past 2160 hour(s))  CBC with Differential/Platelet     Status: Abnormal   Collection Time: 01/16/23  1:51 PM  Result Value Ref Range   WBC 6.8 3.4 - 10.8 x10E3/uL   RBC 5.90 (H) 3.77 - 5.28 x10E6/uL   Hemoglobin 14.7 11.1 - 15.9 g/dL   Hematocrit 16.1 09.6 - 46.6 %   MCV 74 (L) 79 - 97 fL   MCH 24.9 (L) 26.6 - 33.0 pg   MCHC 33.6 31.5 - 35.7 g/dL   RDW 04.5 40.9 - 81.1 %   Platelets 283 150 - 450 x10E3/uL   Neutrophils 39 Not Estab. %   Lymphs 49 Not Estab. %   Monocytes 5 Not Estab. %   Eos 5 Not Estab. %   Basos 2 Not Estab. %   Neutrophils Absolute 2.7 1.4 - 7.0 x10E3/uL   Lymphocytes Absolute 3.3 (H) 0.7 - 3.1 x10E3/uL   Monocytes Absolute 0.4 0.1 - 0.9 x10E3/uL   EOS (ABSOLUTE) 0.3 0.0 - 0.4 x10E3/uL   Basophils Absolute 0.1 0.0 - 0.2 x10E3/uL   Immature Granulocytes 0 Not Estab. %   Immature Grans (Abs) 0.0 0.0 - 0.1 x10E3/uL  B12 and Folate Panel     Status: Abnormal   Collection Time: 01/16/23  1:51 PM  Result Value Ref Range   Vitamin B-12 >2000 (H) 232 - 1245 pg/mL   Folate >  20.0 >3.0 ng/mL    Comment: A serum folate concentration of less than 3.1 ng/mL is considered to represent clinical deficiency.   Comprehensive metabolic panel     Status: Abnormal   Collection Time: 01/16/23  1:51 PM  Result Value Ref Range   Glucose 87 70 - 99 mg/dL   BUN 13 8 - 27 mg/dL   Creatinine, Ser 4.09 0.57 - 1.00 mg/dL   eGFR 76 >81 XB/JYN/8.29   BUN/Creatinine Ratio 15 12 - 28    Sodium 141 134 - 144 mmol/L   Potassium 4.2 3.5 - 5.2 mmol/L   Chloride 98 96 - 106 mmol/L   CO2 27 20 - 29 mmol/L   Calcium 9.9 8.7 - 10.3 mg/dL   Total Protein 7.8 6.0 - 8.5 g/dL   Albumin 5.0 (H) 3.9 - 4.9 g/dL   Globulin, Total 2.8 1.5 - 4.5 g/dL   Albumin/Globulin Ratio 1.8 1.2 - 2.2   Bilirubin Total 0.4 0.0 - 1.2 mg/dL   Alkaline Phosphatase 97 44 - 121 IU/L   AST 20 0 - 40 IU/L   ALT 25 0 - 32 IU/L  Lipid panel     Status: Abnormal   Collection Time: 01/16/23  1:51 PM  Result Value Ref Range   Cholesterol, Total 257 (H) 100 - 199 mg/dL   Triglycerides 562 (H) 0 - 149 mg/dL   HDL 75 >13 mg/dL   VLDL Cholesterol Cal 35 5 - 40 mg/dL   LDL Chol Calc (NIH) 086 (H) 0 - 99 mg/dL   Chol/HDL Ratio 3.4 0.0 - 4.4 ratio    Comment:                                   T. Chol/HDL Ratio                                             Men  Women                               1/2 Avg.Risk  3.4    3.3                                   Avg.Risk  5.0    4.4                                2X Avg.Risk  9.6    7.1                                3X Avg.Risk 23.4   11.0   Urinalysis, Complete     Status: Abnormal   Collection Time: 01/16/23  1:51 PM  Result Value Ref Range   Specific Gravity, UA 1.014 1.005 - 1.030   pH, UA 7.0 5.0 - 7.5   Color, UA Yellow Yellow   Appearance Ur Clear Clear   Leukocytes,UA Negative Negative   Protein,UA Negative Negative/Trace   Glucose, UA 3+ (A) Negative   Ketones, UA Negative Negative   RBC, UA Negative Negative   Bilirubin,  UA Negative Negative   Urobilinogen, Ur 0.2 0.2 - 1.0 mg/dL   Nitrite, UA Negative Negative   Microscopic Examination Comment     Comment: Microscopic follows if indicated.   Microscopic Examination See below:     Comment: Microscopic was indicated and was performed.  Urine Microalbumin w/creat. ratio     Status: None   Collection Time: 01/16/23  1:51 PM  Result Value Ref Range   Creatinine, Urine 38.7 Not Estab. mg/dL    Microalbumin, Urine <3.0 Not Estab. ug/mL   Microalb/Creat Ratio <8 0 - 29 mg/g creat    Comment:                        Normal:                0 -  29                        Moderately increased: 30 - 300                        Severely increased:       >300   VITAMIN D 25 Hydroxy (Vit-D Deficiency, Fractures)     Status: None   Collection Time: 01/16/23  1:51 PM  Result Value Ref Range   Vit D, 25-Hydroxy 62.9 30.0 - 100.0 ng/mL    Comment: Vitamin D deficiency has been defined by the Institute of Medicine and an Endocrine Society practice guideline as a level of serum 25-OH vitamin D less than 20 ng/mL (1,2). The Endocrine Society went on to further define vitamin D insufficiency as a level between 21 and 29 ng/mL (2). 1. IOM (Institute of Medicine). 2010. Dietary reference    intakes for calcium and D. [place]: The    [employer]. 2. Holick MF, Binkley McNeal, Bischoff-Ferrari HA, et al.    Evaluation, treatment, and prevention of vitamin D    deficiency: an Endocrine Society clinical practice    guideline. JCEM. 2011 Jul; 96(7):1911-30.   Microscopic Examination     Status: None   Collection Time: 01/16/23  1:51 PM  Result Value Ref Range   WBC, UA None seen 0 - 5 /hpf   RBC, Urine None seen 0 - 2 /hpf   Epithelial Cells (non renal) 0-10 0 - 10 /hpf   Casts None seen None seen /lpf   Bacteria, UA None seen None seen/Few  HM DIABETES EYE EXAM     Status: None   Collection Time: 02/13/23 12:00 AM  Result Value Ref Range   HM Diabetic Eye Exam No Retinopathy No Retinopathy    Radiology: CT CARDIAC SCORING (SELF PAY ONLY)  Addendum Date: 01/24/2023   ADDENDUM REPORT: 01/24/2023 22:49 EXAM: OVER-READ INTERPRETATION  CT CHEST The following report is an over-read performed by radiologist Dr. Curly Shores [facility] on 01/24/2023. This over-read does not include interpretation of cardiac or coronary anatomy or pathology. The coronary calcium score  interpretation by the cardiologist is attached. COMPARISON:  10/26/2021 FINDINGS: Cardiovascular: No significant cardiovascular abnormalities identified without contrast. Mediastinum/Nodes: No suspicious adenopathy identified. Imaged mediastinal structures are unremarkable. Lungs/Pleura: Imaged lungs are clear. No pleural effusion or pneumothorax. Upper Abdomen: No acute abnormality. Musculoskeletal: No chest wall abnormality. No acute or significant osseous findings. IMPRESSION: No significant extracardiac incidental findings identified. Electronically Signed   By: Layla Maw M.D.   On: 01/24/2023 22:49  Result Date: 01/24/2023 CLINICAL DATA:  Risk stratification EXAM: Coronary Calcium Score TECHNIQUE: The patient was scanned on a Siemens Somatom scanner. Axial non-contrast 3 mm slices were carried out through the heart. The data set was analyzed on a dedicated work station and scored using the Agatson method. FINDINGS: Non-cardiac: See separate report from [facility]. Ascending Aorta: Normal size Pericardium: Normal Coronary arteries: Normal origin of left and right coronary arteries. Distribution of arterial calcifications if present, as noted below; LM 0 LAD 0 LCx 0 RCA 0 Total 0 IMPRESSION AND RECOMMENDATION: 1.  Normal coronary calcium score of 0.  Patient is low risk. 2.  CAC 0, CAC-DRS A0. 3.  Continue heart healthy lifestyle and risk factor modification. Electronically Signed: By: Debbe Odea M.D. On: 01/24/2023 17:57    No results found.  No results found.    Assessment and Plan: Patient Active Problem List   Diagnosis Date Noted   Statin myopathy 01/06/2023   Crohn's colitis, without complications (HCC) 11/22/2022   Sickle cell trait (HCC) 05/06/2022   Crohn's disease of large intestine without complication (HCC) 02/20/2022   Trochanteric bursitis of right hip 10/18/2021   CPAP use counseling 08/14/2020   Insomnia related to another mental disorder    Vitamin D  deficiency 01/07/2020   Low serum vitamin B12 01/07/2020   Osteoarthritis of hip 11/12/2019   DDD (degenerative disc disease), cervical 04/19/2019   Hyperlipidemia LDL goal <70 12/16/2018   Occipital headache 09/01/2018   Dyslipidemia associated with type 2 diabetes mellitus (HCC) 04/22/2018   OSA on CPAP 09/15/2017   Chronic hip pain, right 09/09/2016   Numbness of right foot 09/09/2016   Scoliosis 03/08/2016   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 OSA on CPAP The patient does  tolerate PAP and reports  benefit from PAP use. The patient was reminded how to clean equipment and advised to replace supplies routinely. The patient was also counselled on weight loss. The compliance is good. The AHI is 0.4.   OSA on cpap- controlled. Increase compliance with pap and try to sleep more hours.  CPAP continues to be medically necessary to treat this patient's OSA. F/u one year.     2. CPAP use counseling CPAP Counseling: had a lengthy discussion with the patient regarding the importance of PAP therapy in management of the sleep apnea. Patient appears to understand the risk factor reduction and also understands the risks associated with untreated sleep apnea. Patient will try to make a good faith effort to remain compliant with therapy. Also instructed the patient on proper cleaning of the device including the water must be changed daily if possible and use of distilled water is preferred. Patient understands that the machine should be regularly cleaned with appropriate recommended cleaning solutions that do not damage the PAP machine for example given white vinegar and water rinses. Other methods such as ozone treatment may not be as good as these simple methods to achieve cleaning.     General Counseling: I have discussed the findings of the evaluation and examination with Sharlene.  I  have also discussed any further diagnostic evaluation thatmay be needed or ordered today. Teneisha verbalizes understanding of the findings of todays visit. We also reviewed her medications today and discussed drug interactions and side effects including but not limited excessive drowsiness and altered mental states. We also discussed that there is always a risk  not just to her but also people around her. she has been encouraged to call the office with any questions or concerns that should arise related to todays visit.  No orders of the defined types were placed in this encounter.       I have personally obtained a history, examined the patient, evaluated laboratory and imaging results, formulated the assessment and plan and placed orders. This patient was seen today by Emmaline Kluver, PA-C in collaboration with Dr. Freda Munro.   Yevonne Pax, MD [facility] Diplomate ABMS Pulmonary Critical Care Medicine and Sleep Medicine

## 2023-04-14 ENCOUNTER — Ambulatory Visit (INDEPENDENT_AMBULATORY_CARE_PROVIDER_SITE_OTHER): Payer: Managed Care, Other (non HMO) | Admitting: Internal Medicine

## 2023-04-14 VITALS — BP 129/84 | HR 78 | Resp 16 | Ht 63.0 in | Wt 149.0 lb

## 2023-04-14 DIAGNOSIS — Z7189 Other specified counseling: Secondary | ICD-10-CM

## 2023-04-14 DIAGNOSIS — G4733 Obstructive sleep apnea (adult) (pediatric): Secondary | ICD-10-CM | POA: Diagnosis not present

## 2023-04-14 NOTE — Patient Instructions (Signed)

## 2023-05-07 NOTE — Progress Notes (Signed)
Name: Samantha Stephens   MRN: [id number]    DOB: Nov 07, 1960   Date:05/08/2023       Progress Note  Subjective  Chief Complaint  Follow Up  HPI  Leg Cramps: she is still stretching before bed time and drinking Gatorade zero. She is concerned about what to take for cholesterol since she cannot tolerate statins or zetia. She states leg cramps are seldom now.   DMII: she is on diet only but not as compliant with her diet, A1C went from 7.6 % to 6.9 % ,7.6 % ,6.9 %,  7.7%, down to 6.7 % but today is up again at 7.5 %. She  is  not on ARB because of history of angioedema . She has dyslipidemia but unable to tolerate statins but taking Vascepa , did not start Nexletol but could not tolerate Zetia  She changed her diet and is also exercising. We will adjust dose of Xigduo to 07/999 mg and add Actos today.  Hearing loss: seen by audiologist, she has hearing loss and also tinnitus. Unchanged   Sickel cell trait: CBC showed normal HCT but low MCV stable   Chron's disease: under the care of GI, she states is doing very well on Entivio, no diarrhea or abdominal pain Colonoscopy up to date and will repeat it in 2026 , she will go on a cruise soon, advised to contact Dr. Allegra Lai and see if she can take prednisone to take prn while away   OA: DDD lumbar spine also OA hip, noticing more pain on groin lately, not affecting her ability to go to water aerobics . She has not started Tylenol yet   OSA on CPAP: she is complaint with CPAP and up to date with follow up with pulmonologist   Patient Active Problem List   Diagnosis Date Noted   Statin myopathy 01/06/2023   Crohn's colitis, without complications (HCC) 11/22/2022   Sickle cell trait (HCC) 05/06/2022   Crohn's disease of large intestine without complication (HCC) 02/20/2022   Trochanteric bursitis of right hip 10/18/2021   CPAP use counseling 08/14/2020   Insomnia related to another mental disorder    Vitamin D deficiency 01/07/2020   Low serum  vitamin B12 01/07/2020   Osteoarthritis of hip 11/12/2019   DDD (degenerative disc disease), cervical 04/19/2019   Hyperlipidemia LDL goal <70 12/16/2018   Occipital headache 09/01/2018   Dyslipidemia associated with type 2 diabetes mellitus (HCC) 04/22/2018   OSA on CPAP 09/15/2017   Chronic hip pain, right 09/09/2016   Numbness of right foot 09/09/2016   Scoliosis 03/08/2016   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   Situational anxiety 10/27/2015   Essential hypertension 08/18/2015   Chronic insomnia 08/18/2015    Past Surgical History:  Procedure Laterality Date   ABDOMINAL HYSTERECTOMY     CESAREAN SECTION     1   COLONOSCOPY     COLONOSCOPY WITH PROPOFOL N/A 06/18/2021   Procedure: COLONOSCOPY WITH PROPOFOL;  Surgeon: Toney Reil, MD;  Location: [facility] ENDOSCOPY;  Service: Gastroenterology;  Laterality: N/A;   COLONOSCOPY WITH PROPOFOL N/A 11/22/2022   Procedure: COLONOSCOPY WITH PROPOFOL;  Surgeon: Toney Reil, MD;  Location: [facility] ENDOSCOPY;  Service: Gastroenterology;  Laterality: N/A;   CYSTOSCOPY W/ RETROGRADES Bilateral 11/25/2016   Procedure: CYSTOSCOPY WITH RETROGRADE PYELOGRAM;  Surgeon: Vanna Scotland, MD;  Location: [facility] ORS;  Service: Urology;  Laterality: Bilateral;   EYE SURGERY     lasix eye surgery    Family History  Problem  Relation Age of Onset   Arthritis Mother    COPD Mother    Depression Mother    Diabetes Mother    Hyperlipidemia Mother    Hypertension Mother    Cataracts Mother    Heart disease Mother    Hearing loss Father    Kidney disease Father    Cataracts Father    Cancer - Prostate Father        kidney and prostate cancer, colon cancer   Asthma Sister    Cancer Paternal Grandmother    Mental illness Sister    Breast cancer Other     Social History   Tobacco Use   Smoking status: Former    Current packs/day: 0.00    Types: Cigarettes    Quit date: 1990    Years since quitting: 34.5   Smokeless tobacco:  Never   Tobacco comments:    but only for a short amount of time  Substance Use Topics   Alcohol use: Yes    Alcohol/week: 0.0 - 1.0 standard drinks of alcohol    Comment: occasional glass of wine     Current Outpatient Medications:    amLODipine (NORVASC) 5 MG tablet, TAKE 1 TABLET(5 MG) BY MOUTH DAILY, Disp: 90 tablet, Rfl: 0   Cholecalciferol (VITAMIN D3) 2000 units TABS, Take 1 capsule by mouth daily., Disp: , Rfl:    Dapagliflozin Pro-metFORMIN ER (XIGDUO XR) 10-500 MG TB24, Take 1 tablet by mouth daily., Disp: 90 tablet, Rfl: 1   fluconazole (DIFLUCAN) 150 MG tablet, Take 1 tablet (150 mg total) by mouth every other day. For three doses weekly prn, Disp: 12 tablet, Rfl: 0   Multiple Vitamin (MULTIVITAMIN) tablet, Take 1 tablet by mouth daily., Disp: , Rfl:    neomycin-polymyxin b-dexamethasone (MAXITROL) 3.5-10000-0.1 SUSP, 1 drop 4 (four) times daily., Disp: , Rfl:    triamcinolone cream (KENALOG) 0.1 %, Apply 1 application topically 2 (two) times daily., Disp: 30 g, Rfl: 1   VASCEPA 1 g capsule, TAKE 2 CAPSULES(2 GRAMS) BY MOUTH TWICE DAILY, Disp: 360 capsule, Rfl: 1   vedolizumab (ENTYVIO) 300 MG injection, Inject 300 mg into the vein every 8 (eight) weeks., Disp: , Rfl:   Allergies  Allergen Reactions   Trazodone And Nefazodone Other (See Comments)    Syncope; see in ER Feb 2018   Benicar [Olmesartan] Cough   Statins     I personally reviewed active problem list, medication list, allergies, family history, social history, health maintenance with the patient/caregiver today.   ROS  Constitutional: Negative for fever or weight change.  Respiratory: Negative for cough and shortness of breath.   Cardiovascular: Negative for chest pain or palpitations.  Gastrointestinal: Negative for abdominal pain, no bowel changes.  Musculoskeletal: Negative for gait problem or joint swelling.  Skin: Negative for rash.  Neurological: Negative for dizziness or headache.  No other  specific complaints in a complete review of systems (except as listed in HPI above).   Objective  Vitals:   05/08/23 0746  BP: 122/78  Pulse: 85  Resp: 16  SpO2: 99%  Weight: 150 lb (68 kg)  Height: 5\' 4"  (1.626 m)    Body mass index is 25.75 kg/m.  Physical Exam  Constitutional: Patient appears well-developed and well-nourished. No distress.  HEENT: head atraumatic, normocephalic, pupils equal and reactive to light, neck supple Cardiovascular: Normal rate, regular rhythm and normal heart sounds.  No murmur heard. No BLE edema. Pulmonary/Chest: Effort normal and breath sounds normal. No respiratory  distress. Abdominal: Soft.  There is no tenderness. Psychiatric: Patient has a normal mood and affect. behavior is normal. Judgment and thought content normal.   Recent Results (from the past 2160 hour(s))  HM DIABETES EYE EXAM     Status: None   Collection Time: 02/13/23 12:00 AM  Result Value Ref Range   HM Diabetic Eye Exam No Retinopathy No Retinopathy  POCT HgB A1C     Status: Abnormal   Collection Time: 05/08/23  7:48 AM  Result Value Ref Range   Hemoglobin A1C 7.5 (A) 4.0 - 5.6 %   HbA1c POC (<> result, manual entry)     HbA1c, POC (prediabetic range)     HbA1c, POC (controlled diabetic range)      PHQ2/9:    05/08/2023    7:47 AM 03/11/2023    9:32 AM 01/06/2023    8:15 AM 09/17/2022    7:48 AM 06/26/2022    2:30 PM  Depression screen PHQ 2/9  Decreased Interest 0 0 0 0 0  Down, Depressed, Hopeless 0 0 0 0 0  PHQ - 2 Score 0 0 0 0 0  Altered sleeping 0 0 0 0 0  Tired, decreased energy 0 0 0 0 0  Change in appetite 0 0 0 0 0  Feeling bad or failure about yourself  0 0 0 0 0  Trouble concentrating 0 0 0 0 0  Moving slowly or fidgety/restless 0 0 0 0 0  Suicidal thoughts 0 0 0 0 0  PHQ-9 Score 0 0 0 0 0    phq 9 is negative   Fall Risk:    05/08/2023    7:47 AM 03/11/2023    9:32 AM 01/06/2023    8:15 AM 09/17/2022    7:48 AM 06/26/2022    2:30 PM  Fall  Risk   Falls in the past year? 0 0 0 0 0  Number falls in past yr: 0  0 0   Injury with Fall? 0  0 0   Risk for fall due to : No Fall Risks No Fall Risks No Fall Risks No Fall Risks   Follow up Falls prevention discussed Falls prevention discussed;Education provided;Falls evaluation completed Falls prevention discussed Falls prevention discussed Falls prevention discussed;Education provided;Falls evaluation completed      Functional Status Survey: Is the patient deaf or have difficulty hearing?: No Does the patient have difficulty seeing, even when wearing glasses/contacts?: No Does the patient have difficulty concentrating, remembering, or making decisions?: No Does the patient have difficulty walking or climbing stairs?: No Does the patient have difficulty dressing or bathing?: No Does the patient have difficulty doing errands alone such as visiting a doctor's office or shopping?: No   Assessment & Plan  1. Dyslipidemia associated with type 2 diabetes mellitus (HCC)  - POCT HgB A1C - Dapagliflozin Pro-metFORMIN ER (XIGDUO XR) 07-999 MG TB24; Take 1 tablet by mouth daily.  Dispense: 90 tablet; Refill: 0 - pioglitazone (ACTOS) 15 MG tablet; Take 1 tablet (15 mg total) by mouth daily.  Dispense: 90 tablet; Refill: 0 - VASCEPA 1 g capsule; TAKE 2 CAPSULES(2 GRAMS) BY MOUTH TWICE DAILY  Dispense: 360 capsule; Refill: 1  2. Sickle cell trait (HCC)  Unchanged   3. Crohn's disease of large intestine without complication (HCC)  Doing well on medication, under the care of Dr. Allegra Lai  4. Hypertension associated with type 2 diabetes mellitus (HCC)  Well controlled   5. Low serum vitamin B12  Last  level was at goal   6. Yeast vaginitis  - fluconazole (DIFLUCAN) 150 MG tablet; Take 1 tablet (150 mg total) by mouth every other day. For three doses weekly prn  Dispense: 12 tablet; Refill: 0  7. OSA on CPAP  Under the care of Dr. Welton Flakes  8. Vitamin D deficiency  Taking  supplementation   9. Statin myopathy   10. History of motion sickness  - scopolamine (TRANSDERM-SCOP) 1 MG/3DAYS; Place 1 patch (1.5 mg total) onto the skin every 3 (three) days.  Dispense: 4 patch; Refill: 0

## 2023-05-08 ENCOUNTER — Encounter: Payer: Self-pay | Admitting: Family Medicine

## 2023-05-08 ENCOUNTER — Ambulatory Visit (INDEPENDENT_AMBULATORY_CARE_PROVIDER_SITE_OTHER): Payer: Managed Care, Other (non HMO) | Admitting: Family Medicine

## 2023-05-08 VITALS — BP 122/78 | HR 85 | Resp 16 | Ht 64.0 in | Wt 150.0 lb

## 2023-05-08 DIAGNOSIS — E785 Hyperlipidemia, unspecified: Secondary | ICD-10-CM

## 2023-05-08 DIAGNOSIS — D573 Sickle-cell trait: Secondary | ICD-10-CM

## 2023-05-08 DIAGNOSIS — I152 Hypertension secondary to endocrine disorders: Secondary | ICD-10-CM

## 2023-05-08 DIAGNOSIS — E1159 Type 2 diabetes mellitus with other circulatory complications: Secondary | ICD-10-CM

## 2023-05-08 DIAGNOSIS — T466X5A Adverse effect of antihyperlipidemic and antiarteriosclerotic drugs, initial encounter: Secondary | ICD-10-CM

## 2023-05-08 DIAGNOSIS — E1169 Type 2 diabetes mellitus with other specified complication: Secondary | ICD-10-CM | POA: Diagnosis not present

## 2023-05-08 DIAGNOSIS — K5010 Crohn's disease of large intestine without complications: Secondary | ICD-10-CM

## 2023-05-08 DIAGNOSIS — E559 Vitamin D deficiency, unspecified: Secondary | ICD-10-CM

## 2023-05-08 DIAGNOSIS — E538 Deficiency of other specified B group vitamins: Secondary | ICD-10-CM

## 2023-05-08 DIAGNOSIS — G4733 Obstructive sleep apnea (adult) (pediatric): Secondary | ICD-10-CM

## 2023-05-08 DIAGNOSIS — B3731 Acute candidiasis of vulva and vagina: Secondary | ICD-10-CM

## 2023-05-08 DIAGNOSIS — G720 Drug-induced myopathy: Secondary | ICD-10-CM

## 2023-05-08 DIAGNOSIS — Z87898 Personal history of other specified conditions: Secondary | ICD-10-CM

## 2023-05-08 LAB — POCT GLYCOSYLATED HEMOGLOBIN (HGB A1C): Hemoglobin A1C: 7.5 % — AB (ref 4.0–5.6)

## 2023-05-08 MED ORDER — PIOGLITAZONE HCL 15 MG PO TABS
15.0000 mg | ORAL_TABLET | Freq: Every day | ORAL | 0 refills | Status: DC
Start: 2023-05-08 — End: 2023-08-08

## 2023-05-08 MED ORDER — AMLODIPINE BESYLATE 5 MG PO TABS: ORAL_TABLET | ORAL | 0 refills | Status: DC

## 2023-05-08 MED ORDER — VASCEPA 1 G PO CAPS: ORAL_CAPSULE | ORAL | 1 refills | Status: DC

## 2023-05-08 MED ORDER — SCOPOLAMINE 1 MG/3DAYS TD PT72
1.0000 | MEDICATED_PATCH | TRANSDERMAL | 0 refills | Status: DC
Start: 2023-05-08 — End: 2023-08-12

## 2023-05-08 MED ORDER — DAPAGLIFLOZIN PRO-METFORMIN ER 10-1000 MG PO TB24
1.0000 | ORAL_TABLET | Freq: Every day | ORAL | 0 refills | Status: DC
Start: 2023-05-08 — End: 2023-07-02

## 2023-05-08 MED ORDER — FLUCONAZOLE 150 MG PO TABS: 150.0000 mg | ORAL_TABLET | ORAL | 0 refills | Status: DC

## 2023-05-15 ENCOUNTER — Encounter: Payer: Self-pay | Admitting: Gastroenterology

## 2023-05-15 MED ORDER — PREDNISONE 10 MG PO TABS: ORAL_TABLET | ORAL | 0 refills | Status: AC

## 2023-05-21 ENCOUNTER — Encounter: Payer: Self-pay | Admitting: Family Medicine

## 2023-06-27 ENCOUNTER — Other Ambulatory Visit: Payer: Self-pay | Admitting: Family Medicine

## 2023-07-02 ENCOUNTER — Other Ambulatory Visit: Payer: Self-pay

## 2023-07-09 ENCOUNTER — Encounter: Payer: Self-pay | Admitting: Gastroenterology

## 2023-07-09 ENCOUNTER — Ambulatory Visit (INDEPENDENT_AMBULATORY_CARE_PROVIDER_SITE_OTHER): Payer: Managed Care, Other (non HMO) | Admitting: Gastroenterology

## 2023-07-09 VITALS — BP 132/84 | HR 68 | Temp 97.7°F | Ht 64.0 in | Wt 148.4 lb

## 2023-07-09 DIAGNOSIS — K5010 Crohn's disease of large intestine without complications: Secondary | ICD-10-CM | POA: Diagnosis not present

## 2023-07-09 NOTE — Progress Notes (Signed)
Arlyss Repress, MD [address]  [address]  [place], [place] [address]  Main: [phone]  Fax: [phone]    Gastroenterology Consultation  Referring Provider:     Alba Cory, MD Primary Care Physician:  Alba Cory, MD Primary Gastroenterologist:  Dr. Arlyss Repress Reason for Consultation: Crohn's colitis        HPI:   Samantha Stephens is a 62 y.o. female referred by Dr. Alba Cory, MD  for consultation & management of Crohn's colitis.  Patient is here for follow-up of Crohn's colitis.  She has been doing well and reports that her symptoms of weight loss, abdominal pain, diarrhea, arthralgias have resolved.  She has gained about 10 pounds.  Her hemoglobin A1c is rising, diabetes medication has been readjusted by her PCP, she reports that eating sweets is her weakness. She underwent a DEXA scan which was normal.  She recently returned from [place] cruise and did well with regards to her colitis.  Did not have any major flareup requiring prednisone use.  She noticed random bruises in her arms and feet and she is wondering if it is related to [facility].  She has dull headache for 1 to 2 days after her Entyvio infusion She does report intermittent constipation.  Crohn's disease classification:  Age: 62 to 23 Location: colonic Behavior: non stricturing, non penetrating Perianal: No  IBD diagnosis: Crohn's colitis  Disease course: Patient reports that she was diagnosed with Crohn's disease right after college when she has significant weight loss, abdominal pain associated with severe diarrhea.  She was diagnosed after the colonoscopy. She was intermittently on prednisone at the time of initial diagnosis.  She was not on any maintenance therapy until 08/2021.  She underwent colonoscopy in 05/2021 which confirmed chronic mildly active pancolitis.  Initiated on Entyvio in November 2022 as monotherapy every 8 weeks.  Patient is in complete clinical remission since  initiation of Entyvio.  Extra intestinal manifestations: Chronic pelvic pain  IBD surgical history: None  Patient lives by herself after her husband passed away in 07/15/17.  She works from home to [employer] in the [employer].  She does have diabetes, her most recent hemoglobin A1c is 7.6.  She is very active, walks every day.  Patient does not smoke or drink alcohol  Imaging:  MRE none CTE none SBFT none  Procedures:  Colonoscopy  Colonoscopy 06/17/2017 by Dr. Mechele Collin Excellent prep, internal hemorrhoids were found.  Exam was otherwise normal.  Terminal ileum was not examined  Colonoscopy 06/18/2021 - The examined portion of the ileum was normal. Biopsied. - Normal mucosa in the right colon. Biopsied. - Diffuse mild inflammation was found in the sigmoid colon and in the descending colon secondary to left-sided colitis. Biopsied. - The distal rectum and anal verge are normal on retroflexion view. DIAGNOSIS:  A. TERMINAL ILEUM; COLD BIOPSY:  - FRAGMENTED PORTIONS OF ENTERIC MUCOSA WITH NORMAL VILLOUS ARCHITECTURE  AND REACTIVE LYMPHOID HYPERPLASIA.  - FRAGMENTS OF UNREMARKABLE COLONIC MUCOSA.  - NEGATIVE FOR ACTIVE MUCOSAL ILEITIS/COLITIS.  - NEGATIVE FOR GRANULOMA, DYSPLASIA, AND MALIGNANCY.   B. COLON, RIGHT; COLD BIOPSY:  - CHRONIC COLITIS WITH MILD ACTIVITY (CRYPTITIS).  - SINGLE SMALL SUBMUCOSAL NON-NECROTIZING GRANULOMA IDENTIFIED.  - NEGATIVE FOR DYSPLASIA AND MALIGNANCY.   C. COLON, LEFT; COLD BIOPSY:  - CHRONIC COLITIS WITH MILD ACTIVITY (CRYPTITIS).  - SINGLE SMALL SUBMUCOSAL NON-NECROTIZING GRANULOMA IDENTIFIED.  - NEGATIVE FOR DYSPLASIA AND MALIGNANCY.   Upper Endoscopy none  VCE none  IBD  medications:  Steroids: Responsive to prednisone 5-ASA: None Immunomodulators: AZA, methotrexate TPMT status unknown Biologics:  Anti TNFs: Anti Integrins: Entyvio initiated in 08/2021 as monotherapy every 8 weeks Ustekinumab: Tofactinib: Clinical trial:  Past  Medical History:  Diagnosis Date   Arthritis    Chronic mid back pain 08/18/2015   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   Diabetes mellitus without complication (HCC)    Family hx of colon cancer requiring screening colonoscopy 03/08/2016   Headache    Hyperlipidemia    Hypertension    Hypokalemia    Insomnia related to another mental disorder    Palpitations    Scoliosis 03/08/2016   Sleep apnea    Stress headaches     Past Surgical History:  Procedure Laterality Date   ABDOMINAL HYSTERECTOMY     CESAREAN SECTION     1   COLONOSCOPY     COLONOSCOPY WITH PROPOFOL N/A 06/18/2021   Procedure: COLONOSCOPY WITH PROPOFOL;  Surgeon: Toney Reil, MD;  Location: [facility] ENDOSCOPY;  Service: Gastroenterology;  Laterality: N/A;   COLONOSCOPY WITH PROPOFOL N/A 11/22/2022   Procedure: COLONOSCOPY WITH PROPOFOL;  Surgeon: Toney Reil, MD;  Location: [facility] ENDOSCOPY;  Service: Gastroenterology;  Laterality: N/A;   CYSTOSCOPY W/ RETROGRADES Bilateral 11/25/2016   Procedure: CYSTOSCOPY WITH RETROGRADE PYELOGRAM;  Surgeon: Vanna Scotland, MD;  Location: [facility] ORS;  Service: Urology;  Laterality: Bilateral;   EYE SURGERY     lasix eye surgery    Current Outpatient Medications:    amLODipine (NORVASC) 5 MG tablet, TAKE 1 TABLET(5 MG) BY MOUTH DAILY, Disp: 90 tablet, Rfl: 0   Cholecalciferol (VITAMIN D3) 2000 units TABS, Take 1 capsule by mouth daily., Disp: , Rfl:    fluconazole (DIFLUCAN) 150 MG tablet, Take 1 tablet (150 mg total) by mouth every other day. For three doses weekly prn, Disp: 12 tablet, Rfl: 0   Multiple Vitamin (MULTIVITAMIN) tablet, Take 1 tablet by mouth daily., Disp: , Rfl:    neomycin-polymyxin b-dexamethasone (MAXITROL) 3.5-10000-0.1 SUSP, 1 drop 4 (four) times daily., Disp: , Rfl:    pioglitazone (ACTOS) 15 MG tablet, Take 1 tablet (15 mg total) by mouth daily., Disp: 90 tablet, Rfl: 0   scopolamine (TRANSDERM-SCOP) 1 MG/3DAYS, Place 1 patch (1.5 mg  total) onto the skin every 3 (three) days., Disp: 4 patch, Rfl: 0   triamcinolone cream (KENALOG) 0.1 %, Apply 1 application topically 2 (two) times daily., Disp: 30 g, Rfl: 1   VASCEPA 1 g capsule, TAKE 2 CAPSULES(2 GRAMS) BY MOUTH TWICE DAILY, Disp: 360 capsule, Rfl: 1   vedolizumab (ENTYVIO) 300 MG injection, Inject 300 mg into the vein every 8 (eight) weeks., Disp: , Rfl:    XIGDUO XR 10-500 MG TB24, Take 1 tablet by mouth daily., Disp: , Rfl:     Family History  Problem Relation Age of Onset   Arthritis Mother    COPD Mother    Depression Mother    Diabetes Mother    Hyperlipidemia Mother    Hypertension Mother    Cataracts Mother    Heart disease Mother    Hearing loss Father    Kidney disease Father    Cataracts Father    Cancer - Prostate Father        kidney and prostate cancer, colon cancer   Asthma Sister    Cancer Paternal Grandmother    Mental illness Sister    Breast cancer Other      Social History   Tobacco Use  Smoking status: Former    Current packs/day: 0.00    Types: Cigarettes    Quit date: 1990    Years since quitting: 34.7   Smokeless tobacco: Never   Tobacco comments:    but only for a short amount of time  Vaping Use   Vaping status: Never Used  Substance Use Topics   Alcohol use: Yes    Alcohol/week: 0.0 - 1.0 standard drinks of alcohol    Comment: occasional glass of wine   Drug use: No    Allergies as of 07/09/2023 - Review Complete 07/09/2023  Allergen Reaction Noted   Trazodone and nefazodone Other (See Comments) 11/22/2016   Benicar [olmesartan] Cough 02/02/2018   Statins  04/19/2019   Zetia [ezetimibe]  05/08/2023    Review of Systems:    All systems reviewed and negative except where noted in HPI.   Physical Exam:  BP (!) 145/88 (BP Location: Left Arm, Patient Position: Sitting, Cuff Size: Normal)   Pulse 73   Temp 97.7 F (36.5 C) (Oral)   Ht 5\' 4"  (1.626 m)   Wt 148 lb 6 oz (67.3 kg)   BMI 25.47 kg/m  No LMP  recorded. Patient has had a hysterectomy.  General:   Alert,  Well-developed, well-nourished, pleasant and cooperative in NAD Head:  Normocephalic and atraumatic. Eyes:  Sclera clear, no icterus.   Conjunctiva pink. Ears:  Normal auditory acuity. Nose:  No deformity, discharge, or lesions. Mouth:  No deformity or lesions,oropharynx pink & moist. Neck:  Supple; no masses or thyromegaly. Lungs:  Respirations even and unlabored.  Clear throughout to auscultation.   No wheezes, crackles, or rhonchi. No acute distress. Heart:  Regular rate and rhythm; no murmurs, clicks, rubs, or gallops. Abdomen:  Normal bowel sounds. Soft, non-tender and non-distended without masses, hepatosplenomegaly or hernias noted.  No guarding or rebound tenderness.   Rectal: Not performed Msk:  Symmetrical without gross deformities. Good, equal movement & strength bilaterally. Pulses:  Normal pulses noted. Extremities:  No clubbing or edema.  No cyanosis. Neurologic:  Alert and oriented x3;  grossly normal neurologically. Skin:  Intact without significant lesions or rashes. No jaundice. Psych:  Alert and cooperative. Normal mood and affect.  Imaging Studies: No recent abdominal imaging  Assessment and Plan:   Ame P Defrees is a 62 y.o. pleasant African-American female with diabetes, history of Crohn's disease diagnosed in her 58s is seen in consultation for management of Crohn's colitis.  Exacerbation of Crohn's disease in 8/ 2022.  No evidence of anemia including iron deficiency or B12 deficiency.  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 Anemia: Not present 3.Immunizations: Hep A and B serologies negative, recommend  Twinrix vaccine, she has not received it yet influenza up-to-date, prevnar received, pneumovax received, Zoster received Shingrix vaccine x 2 doses in 2022 4.Cancer screening I) Colon cancer/dysplasia surveillance: No evidence of dysplasia, up to date II) Cervical cancer: N/A s/p hysterectomy III) Skin cancer - counseled about annual skin exam by dermatology and skin protection in summer using sun screen SPF > 50, clothing 5.Bone health Vitamin D status: Normal Bone density testing: DEXA scan on 02/19/2022 normal 5. Labs: Check labs today 6. Smoking: Never smoked 7. NSAIDs and Antibiotics use: None frequently  Chronic pelvic pain:  x-ray pelvis revealed moderate degenerative joint disease of right hip   Follow up in 6 months   Arlyss Repress, MD

## 2023-07-15 LAB — HEMOGLOBIN A1C
Est. average glucose Bld gHb Est-mCnc: 143 mg/dL
Hgb A1c MFr Bld: 6.6 % — ABNORMAL HIGH (ref 4.8–5.6)

## 2023-07-15 LAB — CBC
Hematocrit: 43.5 % (ref 34.0–46.6)
Hemoglobin: 14.4 g/dL (ref 11.1–15.9)
MCH: 25.3 pg — ABNORMAL LOW (ref 26.6–33.0)
MCHC: 33.1 g/dL (ref 31.5–35.7)
MCV: 76 fL — ABNORMAL LOW (ref 79–97)
Platelets: 293 10*3/uL (ref 150–450)
RBC: 5.69 x10E6/uL — ABNORMAL HIGH (ref 3.77–5.28)
RDW: 15.8 % — ABNORMAL HIGH (ref 11.7–15.4)
WBC: 6.8 10*3/uL (ref 3.4–10.8)

## 2023-07-15 LAB — HEPATIC FUNCTION PANEL
ALT: 15 IU/L (ref 0–32)
AST: 17 IU/L (ref 0–40)
Albumin: 4.7 g/dL (ref 3.9–4.9)
Alkaline Phosphatase: 80 IU/L (ref 44–121)
Bilirubin Total: <0.2 mg/dL (ref 0.0–1.2)
Bilirubin, Direct: <0.1 mg/dL (ref 0.00–0.40)
Total Protein: 7.1 g/dL (ref 6.0–8.5)

## 2023-07-15 LAB — VITAMIN K1, SERUM: VITAMIN K1: 4.2 ng/mL — ABNORMAL HIGH (ref 0.10–2.20)

## 2023-07-16 ENCOUNTER — Encounter: Payer: Self-pay | Admitting: Gastroenterology

## 2023-08-08 ENCOUNTER — Other Ambulatory Visit: Payer: Self-pay | Admitting: Family Medicine

## 2023-08-08 DIAGNOSIS — E1169 Type 2 diabetes mellitus with other specified complication: Secondary | ICD-10-CM

## 2023-08-11 NOTE — Progress Notes (Unsigned)
Name: Samantha Stephens   MRN: [id number]    DOB: 04/02/1961   Date:08/12/2023       Progress Note  Subjective  Chief Complaint  Follow Up  HPI  Leg Cramps: she is still stretching before bed time and drinking Gatorade zero. She is concerned about what to take for cholesterol since she cannot tolerate statins or zetia. She states leg cramps are seldom now. Stable now   DMII: she is on diet only but not as compliant with her diet, A1C went from 7.6 % to 6.9 % ,7.6 % ,6.9 %,  7.7%, down to 6.7 % up again to 7.5 % and last month it was down to 6.6 % . She  is  not on ARB because of history of angioedema . She has dyslipidemia but unable to tolerate statins but taking Vascepa , did not start Nexletol but could not tolerate Zetia  She changed her diet and is also exercising. She is now on  Xigduo to 07/999 mg plus Actos and is doing well   Dyslipidemia: she had a CAC of 0, seen by Dr. Ozella Almond who ordered LP(a), NMR and hsCRP, she prefers not taking medication at this time  Hearing loss: seen by audiologist, she has hearing loss and also tinnitus. Mild loss on the left side, she does not want to wear a hearing aid yet   Sickel cell trait: CBC showed normal HCT but low MCV stable Unchanged   Chron's disease: under the care of GI, she states is doing very well on Entivio, no diarrhea or abdominal pain Colonoscopy up to date and will repeat it in 2026 . She saw Dr. Allegra Lai in September and is doing well  OA: DDD lumbar spine also OA hip, noticing more pain on groin lately, not affecting her ability to go to water aerobics . She still does not take medication since mild symptoms   OSA on CPAP: she is complaint with CPAP and up to date with follow up with pulmonologist . Continue current regiment   Patient Active Problem List   Diagnosis Date Noted   Statin myopathy 01/06/2023   Crohn's colitis, without complications (HCC) 11/22/2022   Sickle cell trait (HCC) 05/06/2022   Crohn's disease of large  intestine without complication (HCC) 02/20/2022   Trochanteric bursitis of right hip 10/18/2021   CPAP use counseling 08/14/2020   Insomnia related to another mental disorder    Vitamin D deficiency 01/07/2020   Low serum vitamin B12 01/07/2020   Osteoarthritis of hip 11/12/2019   DDD (degenerative disc disease), cervical 04/19/2019   Hyperlipidemia LDL goal <70 12/16/2018   Occipital headache 09/01/2018   Dyslipidemia associated with type 2 diabetes mellitus (HCC) 04/22/2018   OSA on CPAP 09/15/2017   Chronic hip pain, right 09/09/2016   Numbness of right foot 09/09/2016   Scoliosis 03/08/2016   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   Situational anxiety 10/27/2015   Essential hypertension 08/18/2015   Chronic insomnia 08/18/2015    Past Surgical History:  Procedure Laterality Date   ABDOMINAL HYSTERECTOMY     CESAREAN SECTION     1   COLONOSCOPY     COLONOSCOPY WITH PROPOFOL N/A 06/18/2021   Procedure: COLONOSCOPY WITH PROPOFOL;  Surgeon: Toney Reil, MD;  Location: [facility] ENDOSCOPY;  Service: Gastroenterology;  Laterality: N/A;   COLONOSCOPY WITH PROPOFOL N/A 11/22/2022   Procedure: COLONOSCOPY WITH PROPOFOL;  Surgeon: Toney Reil, MD;  Location: [facility] ENDOSCOPY;  Service: Gastroenterology;  Laterality: N/A;  CYSTOSCOPY W/ RETROGRADES Bilateral 11/25/2016   Procedure: CYSTOSCOPY WITH RETROGRADE PYELOGRAM;  Surgeon: Vanna Scotland, MD;  Location: [facility] ORS;  Service: Urology;  Laterality: Bilateral;   EYE SURGERY     lasix eye surgery    Family History  Problem Relation Age of Onset   Arthritis Mother    COPD Mother    Depression Mother    Diabetes Mother    Hyperlipidemia Mother    Hypertension Mother    Cataracts Mother    Heart disease Mother    Hearing loss Father    Kidney disease Father    Cataracts Father    Cancer - Prostate Father        kidney and prostate cancer, colon cancer   Asthma Sister    Cancer Paternal Grandmother     Mental illness Sister    Breast cancer Other     Social History   Tobacco Use   Smoking status: Former    Current packs/day: 0.00    Types: Cigarettes    Quit date: 1990    Years since quitting: 34.8   Smokeless tobacco: Never   Tobacco comments:    but only for a short amount of time  Substance Use Topics   Alcohol use: Yes    Alcohol/week: 0.0 - 1.0 standard drinks of alcohol    Comment: occasional glass of wine     Current Outpatient Medications:    Cholecalciferol (VITAMIN D3) 2000 units TABS, Take 1 capsule by mouth daily., Disp: , Rfl:    Cyanocobalamin (B-12) 500 MCG SUBL, Place 1 tablet under the tongue 3 (three) times a week., Disp: , Rfl:    Dapagliflozin Pro-metFORMIN ER (XIGDUO XR) 07-999 MG TB24, Take 1 tablet by mouth daily., Disp: 90 tablet, Rfl: 1   fluconazole (DIFLUCAN) 150 MG tablet, Take 1 tablet (150 mg total) by mouth every other day. For three doses weekly prn, Disp: 12 tablet, Rfl: 0   Multiple Vitamin (MULTIVITAMIN) tablet, Take 1 tablet by mouth daily., Disp: , Rfl:    triamcinolone cream (KENALOG) 0.1 %, Apply 1 application topically 2 (two) times daily., Disp: 30 g, Rfl: 1   VASCEPA 1 g capsule, TAKE 2 CAPSULES(2 GRAMS) BY MOUTH TWICE DAILY, Disp: 360 capsule, Rfl: 1   vedolizumab (ENTYVIO) 300 MG injection, Inject 300 mg into the vein every 8 (eight) weeks., Disp: , Rfl:    amLODipine (NORVASC) 5 MG tablet, TAKE 1 TABLET(5 MG) BY MOUTH DAILY, Disp: 90 tablet, Rfl: 1   pioglitazone (ACTOS) 15 MG tablet, Take 1 tablet (15 mg total) by mouth daily., Disp: 90 tablet, Rfl: 0  Allergies  Allergen Reactions   Trazodone And Nefazodone Other (See Comments)    Syncope; see in ER Feb 2018   Benicar [Olmesartan] Cough   Statins    Zetia [Ezetimibe]     I personally reviewed active problem list, medication list, allergies, family history, social history, health maintenance with the patient/caregiver today.   ROS  Ten systems reviewed and is negative  except as mentioned in HPI    Objective  Vitals:   08/12/23 0817  BP: 124/70  Pulse: 73  Resp: 16  SpO2: 97%  Weight: 150 lb (68 kg)  Height: 5\' 4"  (1.626 m)    Body mass index is 25.75 kg/m.  Physical Exam  Constitutional: Patient appears well-developed and well-nourished.  No distress.  HEENT: head atraumatic, normocephalic, pupils equal and reactive to light, neck supple Cardiovascular: Normal rate, regular rhythm and normal heart  sounds.  No murmur heard. No BLE edema. Pulmonary/Chest: Effort normal and breath sounds normal. No respiratory distress. Abdominal: Soft.  There is no tenderness. Psychiatric: Patient has a normal mood and affect. behavior is normal. Judgment and thought content normal.    Diabetic Foot Exam: Diabetic Foot Exam - Simple   Simple Foot Form Visual Inspection No deformities, no ulcerations, no other skin breakdown bilaterally: Yes Sensation Testing Intact to touch and monofilament testing bilaterally: Yes Pulse Check Posterior Tibialis and Dorsalis pulse intact bilaterally: Yes Comments      PHQ2/9:    08/12/2023    8:17 AM 05/08/2023    7:47 AM 03/11/2023    9:32 AM 01/06/2023    8:15 AM 09/17/2022    7:48 AM  Depression screen PHQ 2/9  Decreased Interest 0 0 0 0 0  Down, Depressed, Hopeless 0 0 0 0 0  PHQ - 2 Score 0 0 0 0 0  Altered sleeping 0 0 0 0 0  Tired, decreased energy 0 0 0 0 0  Change in appetite 0 0 0 0 0  Feeling bad or failure about yourself  0 0 0 0 0  Trouble concentrating 0 0 0 0 0  Moving slowly or fidgety/restless 0 0 0 0 0  Suicidal thoughts 0 0 0 0 0  PHQ-9 Score 0 0 0 0 0    phq 9 is negative   Fall Risk:    08/12/2023    8:17 AM 05/08/2023    7:47 AM 03/11/2023    9:32 AM 01/06/2023    8:15 AM 09/17/2022    7:48 AM  Fall Risk   Falls in the past year? 0 0 0 0 0  Number falls in past yr: 0 0  0 0  Injury with Fall? 0 0  0 0  Risk for fall due to : No Fall Risks No Fall Risks No Fall Risks No Fall  Risks No Fall Risks  Follow up Falls prevention discussed Falls prevention discussed Falls prevention discussed;Education provided;Falls evaluation completed Falls prevention discussed Falls prevention discussed      Functional Status Survey: Is the patient deaf or have difficulty hearing?: No Does the patient have difficulty seeing, even when wearing glasses/contacts?: No Does the patient have difficulty concentrating, remembering, or making decisions?: No Does the patient have difficulty walking or climbing stairs?: No Does the patient have difficulty dressing or bathing?: No Does the patient have difficulty doing errands alone such as visiting a doctor's office or shopping?: No    Assessment & Plan  1. Dyslipidemia associated with type 2 diabetes mellitus (HCC)  - Dapagliflozin Pro-metFORMIN ER (XIGDUO XR) 07-999 MG TB24; Take 1 tablet by mouth daily.  Dispense: 90 tablet; Refill: 1 - pioglitazone (ACTOS) 15 MG tablet; Take 1 tablet (15 mg total) by mouth daily.  Dispense: 90 tablet; Refill: 0  2. Hypertension associated with type 2 diabetes mellitus (HCC)  - HM Diabetes Foot Exam - amLODipine (NORVASC) 5 MG tablet; TAKE 1 TABLET(5 MG) BY MOUTH DAILY  Dispense: 90 tablet; Refill: 1  3. Sickle cell trait (HCC)  Stable  4. Crohn's disease of large intestine without complication (HCC)  In remission  5. Low serum vitamin B12  Needs to resume supplementation   6. OSA on CPAP  Compliant   7. Vitamin D deficiency  Resume vitami D   8. Statin myopathy

## 2023-08-12 ENCOUNTER — Encounter: Payer: Self-pay | Admitting: Family Medicine

## 2023-08-12 ENCOUNTER — Ambulatory Visit (INDEPENDENT_AMBULATORY_CARE_PROVIDER_SITE_OTHER): Payer: Managed Care, Other (non HMO) | Admitting: Family Medicine

## 2023-08-12 VITALS — BP 124/70 | HR 73 | Resp 16 | Ht 64.0 in | Wt 150.0 lb

## 2023-08-12 DIAGNOSIS — K5010 Crohn's disease of large intestine without complications: Secondary | ICD-10-CM | POA: Diagnosis not present

## 2023-08-12 DIAGNOSIS — E1159 Type 2 diabetes mellitus with other circulatory complications: Secondary | ICD-10-CM

## 2023-08-12 DIAGNOSIS — E785 Hyperlipidemia, unspecified: Secondary | ICD-10-CM

## 2023-08-12 DIAGNOSIS — D573 Sickle-cell trait: Secondary | ICD-10-CM | POA: Diagnosis not present

## 2023-08-12 DIAGNOSIS — E559 Vitamin D deficiency, unspecified: Secondary | ICD-10-CM

## 2023-08-12 DIAGNOSIS — Z23 Encounter for immunization: Secondary | ICD-10-CM | POA: Diagnosis not present

## 2023-08-12 DIAGNOSIS — G720 Drug-induced myopathy: Secondary | ICD-10-CM

## 2023-08-12 DIAGNOSIS — E1169 Type 2 diabetes mellitus with other specified complication: Secondary | ICD-10-CM

## 2023-08-12 DIAGNOSIS — E538 Deficiency of other specified B group vitamins: Secondary | ICD-10-CM

## 2023-08-12 DIAGNOSIS — G4733 Obstructive sleep apnea (adult) (pediatric): Secondary | ICD-10-CM

## 2023-08-12 DIAGNOSIS — I152 Hypertension secondary to endocrine disorders: Secondary | ICD-10-CM

## 2023-08-12 MED ORDER — PIOGLITAZONE HCL 15 MG PO TABS: 15.0000 mg | ORAL_TABLET | Freq: Every day | ORAL | 0 refills | Status: DC

## 2023-08-12 MED ORDER — AMLODIPINE BESYLATE 5 MG PO TABS: ORAL_TABLET | ORAL | 1 refills | Status: DC

## 2023-08-12 MED ORDER — DAPAGLIFLOZIN PRO-METFORMIN ER 10-1000 MG PO TB24
1.0000 | ORAL_TABLET | Freq: Every day | ORAL | 1 refills | Status: DC
Start: 2023-08-12 — End: 2024-06-04

## 2023-08-12 NOTE — Addendum Note (Signed)
Addended by: Dollene Primrose on: 08/12/2023 09:18 AM   Modules accepted: Orders

## 2023-08-19 ENCOUNTER — Telehealth: Payer: Self-pay

## 2023-08-19 NOTE — Telephone Encounter (Signed)
[facility] sent a fax requesting patient last office visit note fax to them. Printed and fax to [phone]

## 2023-09-02 ENCOUNTER — Other Ambulatory Visit: Payer: Self-pay | Admitting: Family Medicine

## 2023-09-02 DIAGNOSIS — E1169 Type 2 diabetes mellitus with other specified complication: Secondary | ICD-10-CM

## 2023-11-14 ENCOUNTER — Other Ambulatory Visit: Payer: Self-pay | Admitting: Family Medicine

## 2023-11-14 DIAGNOSIS — E1169 Type 2 diabetes mellitus with other specified complication: Secondary | ICD-10-CM

## 2023-12-30 ENCOUNTER — Telehealth: Payer: Self-pay | Admitting: Gastroenterology

## 2023-12-30 NOTE — Telephone Encounter (Signed)
 Patient states she has been having abdominal pain that radiates to her back for several weeks. She states she knows since she is taking the infusion she is supposed to let us know when her symptoms changes. She states she will not be able to make her follow up appointment so wanted to let us know and see what we could do. Made her a appointment to see our PA about the abdominal pain on 03/12 and then reschedule her follow up appointment for 03/03/2024

## 2023-12-30 NOTE — Progress Notes (Unsigned)
 Celso Amy, PA-C [address]  [address]  [place], [place] [address]  Main: [phone]  Fax: [phone]   Primary Care Physician: Alba Cory, MD  Primary Gastroenterologist:  Celso Amy, PA-C / Dr. Lannette Donath    CC: Follow-up Crohn's colitis; Evaluate RUQ Pain  HPI: Samantha Stephens is a 63 y.o. female, established patient Dr. Allegra Lai, returns for 34-month follow-up of Crohn's colitis.  Has been on Entyvio since 08/2021.  Currently in clinical remission on Entyvio monotherapy every 8 weeks.  Last colonoscopy 11/2022 revealed chronic inactive colitis.  Current symptoms: Patient states she has had intermittent episodes of right upper quadrant pain for several weeks.  Yesterday she took Tylenol which helped.  Right upper quadrant pain radiates around to her right mid back.  It is not associated with eating.  She denies nausea or vomiting.  She has chronic bowel irregularities with diarrhea alternating with constipation.  She denies any rectal bleeding or weight loss.  She has had C-section and hysterectomy.  No other abdominal surgeries.  Still has gallbladder.  Exercises daily.  Crohn's disease classification: Age: 26 to 1 Location: colonic Behavior: non stricturing, non penetrating Perianal: No   IBD diagnosis: Crohn's colitis   Disease course: Patient reports that she was diagnosed with Crohn's disease right after college when she has significant weight loss, abdominal pain associated with severe diarrhea.  She was diagnosed after the colonoscopy. She was intermittently on prednisone at the time of initial diagnosis.  She was not on any maintenance therapy until 08/2021.  She underwent colonoscopy in 05/2021 which confirmed chronic mildly active pancolitis.  Initiated on Entyvio in November 2022 as monotherapy every 8 weeks.  Patient has been in complete clinical remission since initiation of Entyvio.   Extra intestinal manifestations: Chronic pelvic pain   IBD  surgical history: None   Patient lives by herself after her husband passed away in 01/06/17.  She works from home to [employer] in the [employer].  She does have diabetes, her most recent hemoglobin A1c is 7.6.  She is very active, walks every day.   Patient does not smoke or drink alcohol   Imaging: MRE none CTE none SBFT none   Procedures:  Colonoscopy 06/17/2017 by Dr. Mechele Collin Excellent prep, internal hemorrhoids were found.  Exam was otherwise normal.  Terminal ileum was not examined   Colonoscopy 06/18/2021 - The examined portion of the ileum was normal. Biopsied. - Normal mucosa in the right colon. Biopsied. - Diffuse mild inflammation was found in the sigmoid colon and in the descending colon secondary to left-sided colitis. Biopsied. - The distal rectum and anal verge are normal on retroflexion view. DIAGNOSIS:  A. TERMINAL ILEUM; COLD BIOPSY:  - FRAGMENTED PORTIONS OF ENTERIC MUCOSA WITH NORMAL VILLOUS ARCHITECTURE  AND REACTIVE LYMPHOID HYPERPLASIA.  - FRAGMENTS OF UNREMARKABLE COLONIC MUCOSA.  - NEGATIVE FOR ACTIVE MUCOSAL ILEITIS/COLITIS.  - NEGATIVE FOR GRANULOMA, DYSPLASIA, AND MALIGNANCY.   B. COLON, RIGHT; COLD BIOPSY:  - CHRONIC COLITIS WITH MILD ACTIVITY (CRYPTITIS).  - SINGLE SMALL SUBMUCOSAL NON-NECROTIZING GRANULOMA IDENTIFIED.  - NEGATIVE FOR DYSPLASIA AND MALIGNANCY.   C. COLON, LEFT; COLD BIOPSY:  - CHRONIC COLITIS WITH MILD ACTIVITY (CRYPTITIS).  - SINGLE SMALL SUBMUCOSAL NON-NECROTIZING GRANULOMA IDENTIFIED.  - NEGATIVE FOR DYSPLASIA AND MALIGNANCY.    Upper Endoscopy: none   Prior Treatment: Steroids: Responsive to prednisone 5-ASA: None Immunomodulators: AZA, methotrexate Anti Integrins: Entyvio initiated in 08/2021 as monotherapy every 8 weeks   Current Outpatient Medications  Medication Sig Dispense Refill   amLODipine (NORVASC) 5 MG tablet TAKE 1 TABLET(5 MG) BY MOUTH DAILY 90 tablet 1   Cholecalciferol (VITAMIN D3) 2000 units TABS Take 1  capsule by mouth daily.     Cyanocobalamin (B-12) 500 MCG SUBL Place 1 tablet under the tongue 3 (three) times a week.     Dapagliflozin Pro-metFORMIN ER (XIGDUO XR) 07-999 MG TB24 Take 1 tablet by mouth daily. 90 tablet 1   fluconazole (DIFLUCAN) 150 MG tablet Take 1 tablet (150 mg total) by mouth every other day. For three doses weekly prn 12 tablet 0   Multiple Vitamin (MULTIVITAMIN) tablet Take 1 tablet by mouth daily.     pioglitazone (ACTOS) 15 MG tablet TAKE 1 TABLET(15 MG) BY MOUTH DAILY 90 tablet 0   triamcinolone cream (KENALOG) 0.1 % Apply 1 application topically 2 (two) times daily. 30 g 1   VASCEPA 1 g capsule TAKE 2 CAPSULES(2 GRAMS) BY MOUTH TWICE DAILY 360 capsule 1   vedolizumab (ENTYVIO) 300 MG injection Inject 300 mg into the vein every 8 (eight) weeks. 1 each 2   No current facility-administered medications for this visit.    Allergies as of 12/31/2023 - Review Complete 12/31/2023  Allergen Reaction Noted   Trazodone and nefazodone Other (See Comments) 11/22/2016   Benicar [olmesartan] Cough 02/02/2018   Statins  04/19/2019   Zetia [ezetimibe]  05/08/2023    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 disorder    Palpitations    Scoliosis 03/08/2016   Sleep apnea    Stress headaches     Past Surgical History:  Procedure Laterality Date   ABDOMINAL HYSTERECTOMY     CESAREAN SECTION     1   COLONOSCOPY     COLONOSCOPY WITH PROPOFOL N/A 06/18/2021   Procedure: COLONOSCOPY WITH PROPOFOL;  Surgeon: Toney Reil, MD;  Location: [facility] ENDOSCOPY;  Service: Gastroenterology;  Laterality: N/A;   COLONOSCOPY WITH PROPOFOL N/A 11/22/2022   Procedure:  COLONOSCOPY WITH PROPOFOL;  Surgeon: Toney Reil, MD;  Location[facility] ENDOSCOPY;  Service: Gastroenterology;  Laterality: N/A;   CYSTOSCOPY W/ RETROGRADES Bilateral 11/25/2016   Procedure: CYSTOSCOPY WITH RETROGRADE PYELOGRAM;  Surgeon: Vanna Scotland, MD;  Location: [facility] ORS;  Service: Urology;  Laterality: Bilateral;   EYE SURGERY     lasix eye surgery    Review of Systems:    All systems reviewed and negative except where noted in HPI.   Physical Examination:   BP 128/80   Pulse 76   Temp 98.4 F (36.9 C)   Ht 5\' 4"  (1.626 m)   Wt 151 lb 12.8 oz (68.9 kg)   BMI 26.06 kg/m   General: Well-nourished, well-developed in no acute distress.  Lungs: Clear to auscultation bilaterally. Non-labored. Heart: Regular rate and rhythm, no murmurs rubs or gallops.  Abdomen: Bowel sounds are normal; Abdomen is Soft; No hepatosplenomegaly, masses or hernias; there is mild RUQ abdominal Tenderness; rest of abdomen is not tender.  No guarding or rebound tenderness.  There is mild right lateral posterior rib cage tenderness. Neuro: Alert and oriented x 3.  Grossly intact.  Psych: Alert and cooperative, normal  mood and affect.   Imaging Studies: No results found.  Assessment and Plan:   TENASIA AULL is a 63 y.o. y/o female returns for followup of Crohn's disease diagnosed in her 96s.  She has had episodes of right upper quadrant pain for several weeks.   1.) Crohn's colitis: Currently in clinical and endoscopic remission and colonoscopy from 11/2022 revealed chronic inactive colitis. 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 Anemia: Not present 3.Immunizations: Hep A and B serologies negative, recommend Twinrix vaccine, she has not received it yet influenza up-to-date, prevnar received, pneumovax received, Zoster  received Shingrix vaccine x 2 doses in 2022 4.Cancer screening I) Colon cancer/dysplasia surveillance: No evidence of dysplasia, up to date; Last Colonoscopy 11/2022 -is up-to-date. II) Cervical cancer: N/A s/p hysterectomy III) Skin cancer - counseled about annual skin exam by dermatology and skin protection in summer using sun screen SPF > 50, clothing 5.Bone health Vitamin D status: Normal Bone density testing: DEXA scan on 02/19/2022 normal  3.) RUQ pain, mild and intermittent x few weeks.  Evaluate for cholelithiasis.  I am suspicious for musculoskeletal pain based on physical exam.  Schedule RUQ ultrasound  Labs: CBC, CMP, lipase  Celso Amy, PA-C  Follow up in 6 months.  Sooner if worsening symptoms.  Also follow-up based on lab results.

## 2023-12-30 NOTE — Telephone Encounter (Addendum)
 The patient called to reschedule her appointment, as she will not be in town on the scheduled date. She expressed concern about pain on her right side that radiates to her back and inquired about the possibility of an earlier appointment. I offered to schedule with the PA-C, but the patient requested to hear from the nurse or Dr. Allegra Lai first before proceeding. Can you please advised me of her route team check up date.

## 2023-12-31 ENCOUNTER — Ambulatory Visit: Admitting: Physician Assistant

## 2023-12-31 ENCOUNTER — Encounter: Payer: Self-pay | Admitting: Physician Assistant

## 2023-12-31 VITALS — BP 128/80 | HR 76 | Temp 98.4°F | Ht 64.0 in | Wt 151.8 lb

## 2023-12-31 DIAGNOSIS — K5010 Crohn's disease of large intestine without complications: Secondary | ICD-10-CM

## 2023-12-31 DIAGNOSIS — R1011 Right upper quadrant pain: Secondary | ICD-10-CM | POA: Diagnosis not present

## 2023-12-31 MED ORDER — VEDOLIZUMAB 300 MG IV SOLR
300.0000 mg | INTRAVENOUS | 2 refills | Status: AC
Start: 2023-12-31 — End: 2024-06-28

## 2023-12-31 NOTE — Patient Instructions (Signed)
 Ultrasound 01-06-24-arrive at 8:45 am . Nothing to eat/drink after midnight.

## 2024-01-01 ENCOUNTER — Encounter: Payer: Self-pay | Admitting: Physician Assistant

## 2024-01-01 LAB — C-REACTIVE PROTEIN: CRP: 1 mg/L (ref 0–10)

## 2024-01-06 ENCOUNTER — Ambulatory Visit: Payer: Managed Care, Other (non HMO) | Admitting: Gastroenterology

## 2024-01-06 ENCOUNTER — Ambulatory Visit

## 2024-01-07 LAB — COMPREHENSIVE METABOLIC PANEL
ALT: 16 IU/L (ref 0–32)
AST: 25 IU/L (ref 0–40)
Albumin: 4.5 g/dL (ref 3.9–4.9)
Alkaline Phosphatase: 75 IU/L (ref 44–121)
BUN/Creatinine Ratio: 19 (ref 12–28)
BUN: 18 mg/dL (ref 8–27)
Bilirubin Total: 0.2 mg/dL (ref 0.0–1.2)
CO2: 23 mmol/L (ref 20–29)
Calcium: 9.3 mg/dL (ref 8.7–10.3)
Chloride: 101 mmol/L (ref 96–106)
Creatinine, Ser: 0.96 mg/dL (ref 0.57–1.00)
Globulin, Total: 2.6 g/dL (ref 1.5–4.5)
Glucose: 105 mg/dL — ABNORMAL HIGH (ref 70–99)
Potassium: 4.5 mmol/L (ref 3.5–5.2)
Sodium: 140 mmol/L (ref 134–144)
Total Protein: 7.1 g/dL (ref 6.0–8.5)
eGFR: 66 mL/min/{1.73_m2} (ref >59)

## 2024-01-07 LAB — HEMOGLOBIN A1C
Est. average glucose Bld gHb Est-mCnc: 146 mg/dL
Hgb A1c MFr Bld: 6.7 % — ABNORMAL HIGH (ref 4.8–5.6)

## 2024-01-07 LAB — VITAMIN K1, SERUM: VITAMIN K1: 1.02 ng/mL (ref 0.10–2.20)

## 2024-01-07 LAB — LIPASE: Lipase: 32 U/L (ref 14–72)

## 2024-01-25 ENCOUNTER — Other Ambulatory Visit: Payer: Self-pay | Admitting: Family Medicine

## 2024-01-25 DIAGNOSIS — E1169 Type 2 diabetes mellitus with other specified complication: Secondary | ICD-10-CM

## 2024-02-17 ENCOUNTER — Other Ambulatory Visit: Payer: Self-pay | Admitting: Family Medicine

## 2024-02-17 DIAGNOSIS — E1169 Type 2 diabetes mellitus with other specified complication: Secondary | ICD-10-CM

## 2024-03-03 ENCOUNTER — Ambulatory Visit: Admitting: Gastroenterology

## 2024-03-03 ENCOUNTER — Encounter: Payer: Self-pay | Admitting: Gastroenterology

## 2024-03-03 VITALS — BP 143/89 | HR 79 | Temp 98.4°F | Wt 154.0 lb

## 2024-03-03 DIAGNOSIS — R635 Abnormal weight gain: Secondary | ICD-10-CM | POA: Diagnosis not present

## 2024-03-03 DIAGNOSIS — K5010 Crohn's disease of large intestine without complications: Secondary | ICD-10-CM | POA: Diagnosis not present

## 2024-03-03 NOTE — Progress Notes (Signed)
 Samantha Oz, MD [address]  [address]  [place], [place] [address]  Main: [phone]  Fax: [phone]    Gastroenterology Consultation  Referring Provider:     Sowles, Krichna, MD Primary Care Physician:  Stephens, Krichna, MD Primary Gastroenterologist:  Dr. Karma Stephens Reason for Consultation: Crohn's colitis        HPI:   Samantha Stephens is a 63 y.o. female referred by Dr. Arleen Lacer, MD  for consultation & management of Crohn's colitis.  Patient is here for follow-up of Crohn's colitis.  She has been doing well and reports that her symptoms of weight loss, abdominal pain, diarrhea, arthralgias have resolved.  She has gained about 10 pounds.  Her hemoglobin A1c is rising, diabetes medication has been readjusted by her PCP, she reports that eating sweets is her weakness. She underwent a DEXA scan which was normal.  She recently [place] from [place] and did well with regards to her colitis.  Did not have any major flareup requiring prednisone  use.  She noticed random bruises in her arms and feet and she is wondering if it is related to Entyvio .  She has dull headache for 1 to 2 days after her Entyvio  infusion She does report intermittent constipation.  Follow-up visit 03/03/2024 Samantha Stephens is here for follow-up of Crohn's colitis.  She has been doing well.  She does not have any GI symptoms today.  She had an episode of right upper quadrant pain in March for which she saw Samantha Canard, PA-C.  Did not undergo right upper quadrant ultrasound as recommended.  Her LFTs were normal.  This episode of pain did not recur.  She is tolerating Entyvio  infusions.  She has been gaining weight, increased her portion size, and eating out with friends more often.  She tries to stay active, swims regularly  Crohn's disease classification:  Age: 66 to 41 Location: colonic Behavior: non stricturing, non penetrating Perianal: No  IBD diagnosis: Crohn's  colitis  Disease course: Patient reports that she was diagnosed with Crohn's disease right after college when she has significant weight loss, abdominal pain associated with severe diarrhea.  She was diagnosed after the colonoscopy. She was intermittently on prednisone  at the time of initial diagnosis.  She was not on any maintenance therapy until 08/2021.  She underwent colonoscopy in 05/2021 which confirmed chronic mildly active pancolitis.  Initiated on Entyvio  in November 2022 as monotherapy every 8 weeks.  Patient is in complete clinical remission since initiation of Entyvio .  Extra intestinal manifestations: Chronic pelvic pain  IBD surgical history: None  Patient lives by herself after her husband passed away in 03/10/2017.  She works from home to [employer] in the [employer].  She does have diabetes, her most recent hemoglobin A1c is 7.6.  She is very active, walks every day.  Patient does not smoke or drink alcohol  Imaging:  MRE none CTE none SBFT none  Procedures:  Colonoscopy 2-24 - The examined portion of the ileum was normal. - The entire examined colon is normal. Biopsied. - The distal rectum and anal verge are normal on retroflexion view. DIAGNOSIS:  A. COLON, RIGHT; COLD BIOPSY:  - MILD CHRONIC COLITIS, WITHOUT ACTIVITY.  - NEGATIVE FOR GRANULOMA, DYSPLASIA, AND MALIGNANCY.   B. COLON, LEFT; COLD BIOPSY:  - MILD CHRONIC COLITIS, WITHOUT ACTIVITY.  - NEGATIVE FOR GRANULOMA, DYSPLASIA, AND MALIGNANCY.   Colonoscopy 06/17/2017 by Dr. Felicita Horns Excellent prep, internal hemorrhoids were found.  Exam was  otherwise normal.  Terminal ileum was not examined  Colonoscopy 06/18/2021 - The examined portion of the ileum was normal. Biopsied. - Normal mucosa in the right colon. Biopsied. - Diffuse mild inflammation was found in the sigmoid colon and in the descending colon secondary to left-sided colitis. Biopsied. - The distal rectum and anal verge are normal on retroflexion  view. DIAGNOSIS:  A. TERMINAL ILEUM; COLD BIOPSY:  - FRAGMENTED PORTIONS OF ENTERIC MUCOSA WITH NORMAL VILLOUS ARCHITECTURE  AND REACTIVE LYMPHOID HYPERPLASIA.  - FRAGMENTS OF UNREMARKABLE COLONIC MUCOSA.  - NEGATIVE FOR ACTIVE MUCOSAL ILEITIS/COLITIS.  - NEGATIVE FOR GRANULOMA, DYSPLASIA, AND MALIGNANCY.   B. COLON, RIGHT; COLD BIOPSY:  - CHRONIC COLITIS WITH MILD ACTIVITY (CRYPTITIS).  - SINGLE SMALL SUBMUCOSAL NON-NECROTIZING GRANULOMA IDENTIFIED.  - NEGATIVE FOR DYSPLASIA AND MALIGNANCY.   C. COLON, LEFT; COLD BIOPSY:  - CHRONIC COLITIS WITH MILD ACTIVITY (CRYPTITIS).  - SINGLE SMALL SUBMUCOSAL NON-NECROTIZING GRANULOMA IDENTIFIED.  - NEGATIVE FOR DYSPLASIA AND MALIGNANCY.   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 disorder    Palpitations    Scoliosis 03/08/2016   Sleep apnea    Stress headaches     Past Surgical History:  Procedure Laterality Date   ABDOMINAL HYSTERECTOMY     CESAREAN SECTION     1   COLONOSCOPY     COLONOSCOPY WITH PROPOFOL  N/A 06/18/2021   Procedure: COLONOSCOPY WITH PROPOFOL ;  Surgeon: Selena Daily, MD;  Location: [facility] ENDOSCOPY;  Service: Gastroenterology;  Laterality: N/A;   COLONOSCOPY WITH PROPOFOL  N/A 11/22/2022   Procedure: COLONOSCOPY WITH PROPOFOL ;  Surgeon: Selena Daily, MD;  Location: [facility] ENDOSCOPY;  Service:  Gastroenterology;  Laterality: N/A;   CYSTOSCOPY W/ RETROGRADES Bilateral 11/25/2016   Procedure: CYSTOSCOPY WITH RETROGRADE PYELOGRAM;  Surgeon: Dustin Gimenez, MD;  Location: [facility] ORS;  Service: Urology;  Laterality: Bilateral;   EYE SURGERY     lasix eye surgery    Current Outpatient Medications:    amLODipine  (NORVASC ) 5 MG tablet, TAKE 1 TABLET(5 MG) BY MOUTH DAILY, Disp: 90 tablet, Rfl: 1   Cholecalciferol (VITAMIN D3) 2000 units TABS, Take 1 capsule by mouth daily., Disp: , Rfl:    Cyanocobalamin (B-12) 500 MCG SUBL, Place 1 tablet under the tongue 3 (three) times a week., Disp: , Rfl:    Dapagliflozin  Pro-metFORMIN  ER (XIGDUO  XR) 07-999 MG TB24, Take 1 tablet by mouth daily., Disp: 90 tablet, Rfl: 1   fluconazole  (DIFLUCAN ) 150 MG tablet, Take 1 tablet (150 mg total) by mouth every other day. For three doses weekly prn, Disp: 12 tablet, Rfl: 0   Multiple Vitamin (MULTIVITAMIN) tablet, Take 1 tablet by mouth daily., Disp: , Rfl:    pioglitazone  (ACTOS ) 15 MG tablet, TAKE 1 TABLET(15 MG) BY MOUTH DAILY, Disp: 30 tablet, Rfl: 0   triamcinolone  cream (KENALOG ) 0.1 %, Apply 1 application topically 2 (two) times daily., Disp: 30 g, Rfl: 1   VASCEPA   1 g capsule, TAKE 2 CAPSULES(2 GRAMS) BY MOUTH TWICE DAILY, Disp: 120 capsule, Rfl: 0   vedolizumab  (ENTYVIO ) 300 MG injection, Inject 300 mg into the vein every 8 (eight) weeks., Disp: 1 each, Rfl: 2    Family History  Problem Relation Age of Onset   Arthritis Mother    COPD Mother    Depression Mother    Diabetes Mother    Hyperlipidemia Mother    Hypertension Mother    Cataracts Mother    Heart disease Mother    Hearing loss Father    Kidney disease Father    Cataracts Father    Cancer - Prostate Father        kidney and prostate cancer, colon cancer   Asthma Sister    Cancer Paternal Grandmother    Mental illness Sister    Breast cancer Other      Social History   Tobacco Use   Smoking status: Former    Current packs/day:  0.00    Types: Cigarettes    Quit date: 1990    Years since quitting: 35.3   Smokeless tobacco: Never   Tobacco comments:    but only for a short amount of time  Vaping Use   Vaping status: Never Used  Substance Use Topics   Alcohol use: Yes    Alcohol/week: 0.0 - 1.0 standard drinks of alcohol    Comment: occasional glass of wine   Drug use: No    Allergies as of 03/03/2024 - Review Complete 03/03/2024  Allergen Reaction Noted   Trazodone  and nefazodone Other (See Comments) 11/22/2016   Benicar  [olmesartan ] Cough 02/02/2018   Statins  04/19/2019   Zetia  [ezetimibe ]  05/08/2023    Review of Systems:    All systems reviewed and negative except where noted in HPI.   Physical Exam:  BP (!) 143/89 (BP Location: Left Arm, Patient Position: Sitting, Cuff Size: Normal)   Pulse 79   Temp 98.4 F (36.9 C) (Oral)   Wt 154 lb (69.9 kg)   BMI 26.43 kg/m  No LMP recorded. Patient has had a hysterectomy.  General:   Alert,  Well-developed, well-nourished, pleasant and cooperative in NAD Head:  Normocephalic and atraumatic. Eyes:  Sclera clear, no icterus.   Conjunctiva pink. Ears:  Normal auditory acuity. Nose:  No deformity, discharge, or lesions. Mouth:  No deformity or lesions,oropharynx pink & moist. Neck:  Supple; no masses or thyromegaly. Lungs:  Respirations even and unlabored.  Clear throughout to auscultation.   No wheezes, crackles, or rhonchi. No acute distress. Heart:  Regular rate and rhythm; no murmurs, clicks, rubs, or gallops. Abdomen:  Normal bowel sounds. Soft, non-tender and non-distended without masses, hepatosplenomegaly or hernias noted.  No guarding or rebound tenderness.   Rectal: Not performed Msk:  Symmetrical without gross deformities. Good, equal movement & strength bilaterally. Pulses:  Normal pulses noted. Extremities:  No clubbing or edema.  No cyanosis. Neurologic:  Alert and oriented x3;  grossly normal neurologically. Skin:  Intact without  significant lesions or rashes. No jaundice. Psych:  Alert and cooperative. Normal mood and affect.  Imaging Studies: No recent abdominal imaging  Assessment and Plan:   Dawne P Saunderson is a 63 y.o. pleasant African-American female with diabetes, history of Crohn's disease diagnosed in her 77s is seen in consultation for management of Crohn's colitis.  Exacerbation of Crohn's disease in 8/ 2022.  No evidence of anemia including iron deficiency or B12 deficiency.  Crohn's colitis: Currently in clinical  and endoscopic remission and colonoscopy from 11/2022 revealed chronic inactive colitis Continue Entyvio  monotherapy every 8 weeks Check CBC and LFTs with every other infusion  Weight gain Discussed about healthy lifestyle, healthy eating habits, portion control, making healthy choices while eating out, continue exercise and physical activity  IBD Health Maintenance  1.TB status: QuantiFERON gold - 06/20/2021 2. Anemia: Not present 3.Immunizations: Hep A and B serologies negative, recommend Twinrix vaccine, she has not received it yet, she said she will get it through her PCPs office.  Influenza up-to-date, prevnar received, pneumovax received, Zoster received Shingrix vaccine x 2 doses in 2022 4.Cancer screening I) Colon cancer/dysplasia surveillance: No evidence of dysplasia, up to date II) Cervical cancer: N/A s/p hysterectomy III) Skin cancer - counseled about annual skin exam by dermatology and skin protection in summer using sun screen SPF > 50, clothing 5.Bone health Vitamin D  status: Normal Bone density testing: DEXA scan on 02/19/2022 normal 5. Labs: Check labs today 6. Smoking: Never smoked 7. NSAIDs and Antibiotics use: None frequently   Follow up in 6 months   Samantha Oz, MD

## 2024-03-04 ENCOUNTER — Telehealth: Payer: Self-pay

## 2024-03-04 NOTE — Telephone Encounter (Signed)
 Pt medical records (all) were sent to [facility]

## 2024-03-08 ENCOUNTER — Other Ambulatory Visit: Payer: Self-pay | Admitting: Family Medicine

## 2024-03-08 ENCOUNTER — Telehealth: Payer: Self-pay | Admitting: Family Medicine

## 2024-03-08 NOTE — Telephone Encounter (Signed)
 Copied from CRM [id number]. Topic: Clinical - Medical Advice >> Mar 08, 2024  3:21 PM Marissa P wrote: Reason for CRM: Patient would like an order TB test please to do asap

## 2024-03-08 NOTE — Telephone Encounter (Signed)
 Pt has a niece that was positive last week during her CPE, she was around her before on Mothers day. Patient would like to have blood work done.

## 2024-03-09 ENCOUNTER — Other Ambulatory Visit: Payer: Self-pay | Admitting: Family Medicine

## 2024-03-09 DIAGNOSIS — Z201 Contact with and (suspected) exposure to tuberculosis: Secondary | ICD-10-CM

## 2024-03-12 ENCOUNTER — Ambulatory Visit (INDEPENDENT_AMBULATORY_CARE_PROVIDER_SITE_OTHER): Payer: Self-pay | Admitting: Family Medicine

## 2024-03-12 ENCOUNTER — Encounter: Payer: Self-pay | Admitting: Family Medicine

## 2024-03-12 VITALS — BP 124/80 | HR 81 | Resp 16 | Ht 63.0 in | Wt 149.7 lb

## 2024-03-12 DIAGNOSIS — E559 Vitamin D deficiency, unspecified: Secondary | ICD-10-CM

## 2024-03-12 DIAGNOSIS — E785 Hyperlipidemia, unspecified: Secondary | ICD-10-CM

## 2024-03-12 DIAGNOSIS — E538 Deficiency of other specified B group vitamins: Secondary | ICD-10-CM | POA: Diagnosis not present

## 2024-03-12 DIAGNOSIS — Z201 Contact with and (suspected) exposure to tuberculosis: Secondary | ICD-10-CM

## 2024-03-12 DIAGNOSIS — I10 Essential (primary) hypertension: Secondary | ICD-10-CM

## 2024-03-12 DIAGNOSIS — Z0001 Encounter for general adult medical examination with abnormal findings: Secondary | ICD-10-CM

## 2024-03-12 DIAGNOSIS — Z1231 Encounter for screening mammogram for malignant neoplasm of breast: Secondary | ICD-10-CM

## 2024-03-12 DIAGNOSIS — Z0000 Encounter for general adult medical examination without abnormal findings: Secondary | ICD-10-CM

## 2024-03-12 DIAGNOSIS — E1169 Type 2 diabetes mellitus with other specified complication: Secondary | ICD-10-CM

## 2024-03-12 NOTE — Progress Notes (Signed)
 Name: Samantha Stephens   MRN: [id number]    DOB: May 14, 1961   Date:03/12/2024       Progress Note  Subjective  Chief Complaint  Chief Complaint  Patient presents with   Annual Exam    HPI  Patient presents for annual CPE.  Diet: balanced diet  Exercise: continue regular activity   Last Eye Exam: completed Last Dental Exam: completed  Flowsheet Row Office Visit from 03/12/2024 in [facility]  AUDIT-C Score 1      Depression: Phq 9 is  negative    03/12/2024    7:58 AM 08/12/2023    8:17 AM 05/08/2023    7:47 AM 03/11/2023    9:32 AM 01/06/2023    8:15 AM  Depression screen PHQ 2/9  Decreased Interest 0 0 0 0 0  Down, Depressed, Hopeless 0 0 0 0 0  PHQ - 2 Score 0 0 0 0 0  Altered sleeping 0 0 0 0 0  Tired, decreased energy 0 0 0 0 0  Change in appetite 0 0 0 0 0  Feeling bad or failure about yourself  0 0 0 0 0  Trouble concentrating 0 0 0 0 0  Moving slowly or fidgety/restless 0 0 0 0 0  Suicidal thoughts 0 0 0 0 0  PHQ-9 Score 0 0 0 0 0  Difficult doing work/chores Not difficult at all       Hypertension: BP Readings from Last 3 Encounters:  03/12/24 124/80  03/03/24 (!) 143/89  12/31/23 128/80   Obesity: Wt Readings from Last 3 Encounters:  03/12/24 149 lb 11.2 oz (67.9 kg)  03/03/24 154 lb (69.9 kg)  12/31/23 151 lb 12.8 oz (68.9 kg)   BMI Readings from Last 3 Encounters:  03/12/24 26.52 kg/m  03/03/24 26.43 kg/m  12/31/23 26.06 kg/m     Vaccines: reviewed with the patient.   Hep C Screening: completed STD testing and prevention (HIV/chl/gon/syphilis):  Intimate partner violence: negative screen  Sexual History : not currently  Menstrual History/LMP/Abnormal Bleeding: s/p hysterectomy  Discussed importance of follow up if any post-menopausal bleeding: not applicable  Incontinence Symptoms: positive for mild urgency symptoms  Breast cancer:  - Last Mammogram: she needs to schedule it  - BRCA gene screening:  N/A  Osteoporosis Prevention : Discussed high calcium  and vitamin D  supplementation, weight bearing exercises Bone density :up to date   Cervical cancer screening: not applicable due to hysterectomy  Skin cancer: Discussed monitoring for atypical lesions  Colorectal cancer: repeat in 2029    Lung cancer:  Low Dose CT Chest recommended if Age 70-80 years, 20 pack-year currently smoking OR have quit w/in 15years. Patient does not qualify for screen   ECG: 2023   Advanced Care Planning: A voluntary discussion about advance care planning including the explanation and discussion of advance directives.  Discussed health care proxy and Living will, and the patient was able to identify a health care proxy as daughter Kaylin Guhl.  Patient does have a living will and power of attorney of health care   Patient Active Problem List   Diagnosis Date Noted   Statin myopathy 01/06/2023   Crohn's colitis, without complications (HCC) 11/22/2022   Sickle cell trait (HCC) 05/06/2022   Crohn's disease of large intestine without complication (HCC) 02/20/2022   Trochanteric bursitis of right hip 10/18/2021   CPAP use counseling 08/14/2020   Insomnia related to another mental disorder    Vitamin D  deficiency 01/07/2020  Low serum vitamin B12 01/07/2020   Osteoarthritis of hip 11/12/2019   DDD (degenerative disc disease), cervical 04/19/2019   Hyperlipidemia LDL goal <70 12/16/2018   Occipital headache 09/01/2018   Dyslipidemia associated with type 2 diabetes mellitus (HCC) 04/22/2018   OSA on CPAP 09/15/2017   Chronic hip pain, right 09/09/2016   Numbness of right foot 09/09/2016   Scoliosis 03/08/2016   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   Situational anxiety 10/27/2015   Essential hypertension 08/18/2015   Chronic insomnia 08/18/2015    Past Surgical History:  Procedure Laterality Date   ABDOMINAL HYSTERECTOMY     CESAREAN SECTION     1   COLONOSCOPY     COLONOSCOPY WITH  PROPOFOL  N/A 06/18/2021   Procedure: COLONOSCOPY WITH PROPOFOL ;  Surgeon: Selena Daily, MD;  Location: [facility] ENDOSCOPY;  Service: Gastroenterology;  Laterality: N/A;   COLONOSCOPY WITH PROPOFOL  N/A 11/22/2022   Procedure: COLONOSCOPY WITH PROPOFOL ;  Surgeon: Selena Daily, MD;  Location: [facility] ENDOSCOPY;  Service: Gastroenterology;  Laterality: N/A;   CYSTOSCOPY W/ RETROGRADES Bilateral 11/25/2016   Procedure: CYSTOSCOPY WITH RETROGRADE PYELOGRAM;  Surgeon: Dustin Gimenez, MD;  Location: [facility] ORS;  Service: Urology;  Laterality: Bilateral;   EYE SURGERY     lasix eye surgery    Family History  Problem Relation Age of Onset   Arthritis Mother    COPD Mother    Depression Mother    Diabetes Mother    Hyperlipidemia Mother    Hypertension Mother    Cataracts Mother    Heart disease Mother    Hearing loss Father    Kidney disease Father    Cataracts Father    Cancer - Prostate Father        kidney and prostate cancer, colon cancer   Asthma Sister    Cancer Paternal Grandmother    Mental illness Sister    Breast cancer Other     Social History   Socioeconomic History   Marital status: Widowed    Spouse name: Not on file   Number of children: 1   Years of education: Not on file   Highest education level: Bachelor's degree (e.g., BA, AB, BS)  Occupational History   Occupation: IT   Tobacco Use   Smoking status: Former    Current packs/day: 0.00    Types: Cigarettes    Quit date: 1990    Years since quitting: 35.4   Smokeless tobacco: Never   Tobacco comments:    but only for a short amount of time  Vaping Use   Vaping status: Never Used  Substance and Sexual Activity   Alcohol use: Yes    Alcohol/week: 0.0 - 1.0 standard drinks of alcohol    Comment: occasional glass of wine   Drug use: No   Sexual activity: Not Currently    Partners: Male  Other Topics Concern   Not on file  Social History Narrative   Not on file   Social Drivers of Health   Financial  Resource Strain: Low Risk  (03/12/2024)   Overall Financial Resource Strain (CARDIA)    Difficulty of Paying Living Expenses: Not hard at all  Food Insecurity: No Food Insecurity (03/12/2024)   Hunger Vital Sign    Worried About Running Out of Food in the Last Year: Never true    Ran Out of Food in the Last Year: Never true  Transportation Needs: No Transportation Needs (03/12/2024)   PRAPARE - Transportation    Lack of  Transportation (Medical): No    Lack of Transportation (Non-Medical): No  Physical Activity: Sufficiently Active (03/12/2024)   Exercise Vital Sign    Days of Exercise per Week: 4 days    Minutes of Exercise per Session: 60 min  Stress: Stress Concern Present (03/12/2024)   [employer] of Occupational Health - Occupational Stress Questionnaire    Feeling of Stress : To some extent  Social Connections: Moderately Integrated (03/12/2024)   Social Connection and Isolation Panel [NHANES]    Frequency of Communication with Friends and Family: More than three times a week    Frequency of Social Gatherings with Friends and Family: More than three times a week    Attends Religious Services: More than 4 times per year    Active Member of [employer] or Organizations: Yes    Attends Banker Meetings: More than 4 times per year    Marital Status: Widowed  Intimate Partner Violence: Not At Risk (03/12/2024)   Humiliation, Afraid, Rape, and Kick questionnaire    Fear of Current or Ex-Partner: No    Emotionally Abused: No    Physically Abused: No    Sexually Abused: No     Current Outpatient Medications:    amLODipine  (NORVASC ) 5 MG tablet, TAKE 1 TABLET(5 MG) BY MOUTH DAILY, Disp: 90 tablet, Rfl: 1   Dapagliflozin  Pro-metFORMIN  ER (XIGDUO  XR) 07-999 MG TB24, Take 1 tablet by mouth daily., Disp: 90 tablet, Rfl: 1   fluconazole  (DIFLUCAN ) 150 MG tablet, Take 1 tablet (150 mg total) by mouth every other day. For three doses weekly prn, Disp: 12 tablet, Rfl: 0   Multiple  Vitamin (MULTIVITAMIN) tablet, Take 1 tablet by mouth daily., Disp: , Rfl:    pioglitazone  (ACTOS ) 15 MG tablet, TAKE 1 TABLET(15 MG) BY MOUTH DAILY, Disp: 30 tablet, Rfl: 0   triamcinolone  cream (KENALOG ) 0.1 %, Apply 1 application topically 2 (two) times daily., Disp: 30 g, Rfl: 1   VASCEPA  1 g capsule, TAKE 2 CAPSULES(2 GRAMS) BY MOUTH TWICE DAILY, Disp: 120 capsule, Rfl: 0   vedolizumab  (ENTYVIO ) 300 MG injection, Inject 300 mg into the vein every 8 (eight) weeks., Disp: 1 each, Rfl: 2   Cholecalciferol (VITAMIN D3) 2000 units TABS, Take 1 capsule by mouth daily. (Patient not taking: Reported on 03/12/2024), Disp: , Rfl:    Cyanocobalamin (B-12) 500 MCG SUBL, Place 1 tablet under the tongue 3 (three) times a week. (Patient not taking: Reported on 03/12/2024), Disp: , Rfl:   Allergies  Allergen Reactions   Trazodone  And Nefazodone Other (See Comments)    Syncope; see in ER Feb 2018   Benicar  [Olmesartan ] Cough   Statins    Zetia  [Ezetimibe ]      ROS  Constitutional: Negative for fever or weight change.  Respiratory: Negative for cough and shortness of breath.   Cardiovascular: Negative for chest pain or palpitations.  Gastrointestinal: Negative for abdominal pain, no bowel changes.  Musculoskeletal: Negative for gait problem or joint swelling.  Skin: Negative for rash.  Neurological: Negative for dizziness or headache.  No other specific complaints in a complete review of systems (except as listed in HPI above).   Objective  Vitals:   03/12/24 0803  BP: 124/80  Pulse: 81  Resp: 16  SpO2: 93%  Weight: 149 lb 11.2 oz (67.9 kg)  Height: 5\' 3"  (1.6 m)    Body mass index is 26.52 kg/m.  Physical Exam  Constitutional: Patient appears well-developed and well-nourished. No distress.  HENT: Head: Normocephalic  and atraumatic. Ears: B TMs ok, no erythema or effusion; Nose: Nose normal. Mouth/Throat: Oropharynx is clear and moist. No oropharyngeal exudate.  Eyes: Conjunctivae  and EOM are normal. Pupils are equal, round, and reactive to light. No scleral icterus.  Neck: Normal range of motion. Neck supple. No JVD present. No thyromegaly present.  Cardiovascular: Normal rate, regular rhythm and normal heart sounds.  No murmur heard. No BLE edema. Pulmonary/Chest: Effort normal and breath sounds normal. No respiratory distress. Abdominal: Soft. Bowel sounds are normal, no distension. There is no tenderness. no masses Breast: no lumps or masses, no nipple discharge or rashes FEMALE GENITALIA:  Not done  RECTAL: not done  Musculoskeletal: Normal range of motion, no joint effusions. No gross deformities Neurological: he is alert and oriented to person, place, and time. No cranial nerve deficit. Coordination, balance, strength, speech and gait are normal.  Skin: Skin is warm and dry. No rash noted. No erythema.  Psychiatric: Patient has a normal mood and affect. behavior is normal. Judgment and thought content normal.     Assessment & Plan  1. Well adult exam (Primary)  - MM 3D SCREENING MAMMOGRAM BILATERAL BREAST; Future - Lipid panel - Microalbumin / creatinine urine ratio - CBC with Differential/Platelet - Comprehensive metabolic panel with GFR - B12 and Folate Panel - VITAMIN D  25 Hydroxy (Vit-D Deficiency, Fractures) - QuantiFERON-TB Gold Plus  2. Encounter for screening mammogram for malignant neoplasm of breast  - MM 3D SCREENING MAMMOGRAM BILATERAL BREAST; Future  3. Vitamin D  deficiency  - VITAMIN D  25 Hydroxy (Vit-D Deficiency, Fractures)  4. Essential hypertension  - CBC with Differential/Platelet - Comprehensive metabolic panel with GFR  5. Low serum vitamin B12  - B12 and Folate Panel  6. Dyslipidemia associated with type 2 diabetes mellitus (HCC)  - Lipid panel - Microalbumin / creatinine urine ratio  7. Exposure to TB  - QuantiFERON-TB Gold Plus    -USPSTF grade A and B recommendations reviewed with patient; age-appropriate  recommendations, preventive care, screening tests, etc discussed and encouraged; healthy living encouraged; see AVS for patient education given to patient -Discussed importance of 150 minutes of physical activity weekly, eat two servings of fish weekly, eat one serving of tree nuts ( cashews, pistachios, pecans, almonds.Aaron Aas) every other day, eat 6 servings of fruit/vegetables daily and drink plenty of water and avoid sweet beverages.   -Reviewed Health Maintenance: Yes.

## 2024-03-19 ENCOUNTER — Ambulatory Visit (LOCAL_COMMUNITY_HEALTH_CENTER): Payer: Self-pay

## 2024-03-19 DIAGNOSIS — Z719 Counseling, unspecified: Secondary | ICD-10-CM

## 2024-03-19 NOTE — Progress Notes (Signed)
 In nurse clinic today stated niece had +ppd and negative chest xray ,but did start medication for latent TB. Pt states she went to lunch with niece on mother's day weekend. Discussed risk of exposure and discussed that niece does not have active tb and she does not live with her or work with her. Decided she did not want to get ppd today. Sent to clerical to get money back.

## 2024-03-22 ENCOUNTER — Other Ambulatory Visit: Payer: Self-pay

## 2024-03-26 ENCOUNTER — Other Ambulatory Visit (HOSPITAL_COMMUNITY): Payer: Self-pay

## 2024-03-26 ENCOUNTER — Telehealth: Payer: Self-pay | Admitting: Pharmacy Technician

## 2024-03-26 NOTE — Telephone Encounter (Signed)
 Pharmacy Patient Advocate Encounter   Received notification from CoverMyMeds that prior authorization for Vascepa  1GM capsules is due for renewal.   Insurance verification completed.   The patient is insured through [facility].  Action: PA required; PA started via CoverMyMeds. KEY [id number] . Please see clinical question(s) below that I am not finding the answer to in their chart and advise.  Is the patient able to take the generic version of Vascepa  1GM and if not why?

## 2024-03-29 ENCOUNTER — Other Ambulatory Visit (HOSPITAL_COMMUNITY): Payer: Self-pay

## 2024-04-09 NOTE — Progress Notes (Unsigned)
 [facility] [address] [place], [place] [address]  Pulmonary Sleep Medicine   Office Visit Note  Patient Name: Samantha Stephens DOB: [birth year] MRN [id number]    Chief Complaint: Obstructive Sleep Apnea visit  Brief History:  Samantha Stephens is seen today for an annual follow up visit for APAP@ 4-20 cmH2O. The patient has a 17 year history of sleep apnea. Patient is using PAP nightly.  The patient feels rested after sleeping with PAP.  The patient reports benefiting from PAP use. Reported sleepiness is  improved and the Epworth Sleepiness Score is 6 out of 24. The patient will sometimes take naps. The patient complains of the following: pt is in need of supplies.  The compliance download shows 72%  compliance with an average use time of 4 hours 39 minutes. The AHI is 0.6.  The patient does not complain of limb movements disrupting sleep. The patient continues to require PAP therapy in order to eliminate sleep apnea.   ROS  General: (-) fever, (-) chills, (-) night sweat Nose and Sinuses: (-) nasal stuffiness or itchiness, (-) postnasal drip, (-) nosebleeds, (-) sinus trouble. Mouth and Throat: (-) sore throat, (-) hoarseness. Neck: (-) swollen glands, (-) enlarged thyroid , (-) neck pain. Respiratory: - cough, - shortness of breath, - wheezing. Neurologic: - numbness, - tingling. Psychiatric: - anxiety, - depression   Current Medication: Outpatient Encounter Medications as of 04/12/2024  Medication Sig   amLODipine  (NORVASC ) 5 MG tablet TAKE 1 TABLET(5 MG) BY MOUTH DAILY   Cholecalciferol (VITAMIN D3) 2000 units TABS Take 1 capsule by mouth daily. (Patient not taking: Reported on 03/12/2024)   Cyanocobalamin (B-12) 500 MCG SUBL Place 1 tablet under the tongue 3 (three) times a week. (Patient not taking: Reported on 03/12/2024)   Dapagliflozin  Pro-metFORMIN  ER (XIGDUO  XR) 07-999 MG TB24 Take 1 tablet by mouth daily.   fluconazole  (DIFLUCAN ) 150 MG tablet Take 1 tablet (150 mg  total) by mouth every other day. For three doses weekly prn   Multiple Vitamin (MULTIVITAMIN) tablet Take 1 tablet by mouth daily.   pioglitazone  (ACTOS ) 15 MG tablet TAKE 1 TABLET(15 MG) BY MOUTH DAILY   triamcinolone  cream (KENALOG ) 0.1 % Apply 1 application topically 2 (two) times daily.   VASCEPA  1 g capsule TAKE 2 CAPSULES(2 GRAMS) BY MOUTH TWICE DAILY   vedolizumab  (ENTYVIO ) 300 MG injection Inject 300 mg into the vein every 8 (eight) weeks.   No facility-administered encounter medications on file as of 04/12/2024.    Surgical History: Past Surgical History:  Procedure Laterality Date   ABDOMINAL HYSTERECTOMY     CESAREAN SECTION     1   COLONOSCOPY     COLONOSCOPY WITH PROPOFOL  N/A 06/18/2021   Procedure: COLONOSCOPY WITH PROPOFOL ;  Surgeon: Unk Corinn Skiff, MD;  Location: [facility] ENDOSCOPY;  Service: Gastroenterology;  Laterality: N/A;   COLONOSCOPY WITH PROPOFOL  N/A 11/22/2022   Procedure: COLONOSCOPY WITH PROPOFOL ;  Surgeon: Unk Corinn Skiff, MD;  Location: [facility] ENDOSCOPY;  Service: Gastroenterology;  Laterality: N/A;   CYSTOSCOPY W/ RETROGRADES Bilateral 11/25/2016   Procedure: CYSTOSCOPY WITH RETROGRADE PYELOGRAM;  Surgeon: Rosina Riis, MD;  Location: [facility] ORS;  Service: Urology;  Laterality: Bilateral;   EYE SURGERY     lasix eye surgery    Medical History: 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   Diabetes mellitus without complication (HCC)    Family hx of colon  cancer requiring screening colonoscopy 03/08/2016   Headache    Hyperlipidemia    Hypertension    Hypokalemia    Insomnia related to another mental disorder    Palpitations    Scoliosis 03/08/2016   Sleep apnea    Stress headaches     Family History: Non contributory to the present illness  Social History: Social History   Socioeconomic History   Marital status: Widowed    Spouse name: Not on  file   Number of children: 1   Years of education: Not on file   Highest education level: Bachelor's degree (e.g., BA, AB, BS)  Occupational History   Occupation: IT   Tobacco Use   Smoking status: Former    Current packs/day: 0.00    Types: Cigarettes    Quit date: 1990    Years since quitting: 35.4   Smokeless tobacco: Never   Tobacco comments:    but only for a short amount of time  Vaping Use   Vaping status: Never Used  Substance and Sexual Activity   Alcohol use: Yes    Alcohol/week: 0.0 - 1.0 standard drinks of alcohol    Comment: occasional glass of wine   Drug use: No   Sexual activity: Not Currently    Partners: Male  Other Topics Concern   Not on file  Social History Narrative   Not on file   Social Drivers of Health   Financial Resource Strain: Low Risk  (03/12/2024)   Overall Financial Resource Strain (CARDIA)    Difficulty of Paying Living Expenses: Not hard at all  Food Insecurity: No Food Insecurity (03/12/2024)   Hunger Vital Sign    Worried About Running Out of Food in the Last Year: Never true    Ran Out of Food in the Last Year: Never true  Transportation Needs: No Transportation Needs (03/12/2024)   PRAPARE - Administrator, Civil Service (Medical): No    Lack of Transportation (Non-Medical): No  Physical Activity: Sufficiently Active (03/12/2024)   Exercise Vital Sign    Days of Exercise per Week: 4 days    Minutes of Exercise per Session: 60 min  Stress: Stress Concern Present (03/12/2024)   [employer] of Occupational Health - Occupational Stress Questionnaire    Feeling of Stress : To some extent  Social Connections: Moderately Integrated (03/12/2024)   Social Connection and Isolation Panel    Frequency of Communication with Friends and Family: More than three times a week    Frequency of Social Gatherings with Friends and Family: More than three times a week    Attends Religious Services: More than 4 times per year    Active  Member of [employer] or Organizations: Yes    Attends Banker Meetings: More than 4 times per year    Marital Status: Widowed  Intimate Partner Violence: Not At Risk (03/12/2024)   Humiliation, Afraid, Rape, and Kick questionnaire    Fear of Current or Ex-Partner: No    Emotionally Abused: No    Physically Abused: No    Sexually Abused: No    Vital Signs: Blood pressure 127/82, pulse 72, resp. rate 16, height 5' 4 (1.626 m), weight 151 lb (68.5 kg), SpO2 98%. Body mass index is 25.92 kg/m.    Examination: General Appearance: The patient is well-developed, well-nourished, and in no distress. Neck Circumference: 37.5 cm Skin: Gross inspection of skin unremarkable. Head: normocephalic, no gross deformities. Eyes: no gross deformities noted. ENT: ears appear  grossly normal Neurologic: Alert and oriented. No involuntary movements.  STOP BANG RISK ASSESSMENT S (snore) Have you been told that you snore?     NO   T (tired) Are you often tired, fatigued, or sleepy during the day?   NO  O (obstruction) Do you stop breathing, choke, or gasp during sleep? NO   P (pressure) Do you have or are you being treated for high blood pressure? YES   B (BMI) Is your body index greater than 35 kg/m? NO   A (age) Are you 87 years old or older? YES   N (neck) Do you have a neck circumference greater than 16 inches?   NO   G (gender) Are you a female? NO   TOTAL STOP/BANG "YES" ANSWERS 2       A STOP-Bang score of 2 or less is considered low risk, and a score of 5 or more is high risk for having either moderate or severe OSA. For people who score 3 or 4, doctors may need to perform further assessment to determine how likely they are to have OSA.         EPWORTH SLEEPINESS SCALE:  Scale:  (0)= no chance of dozing; (1)= slight chance of dozing; (2)= moderate chance of dozing; (3)= high chance of dozing  Chance  Situtation    Sitting and reading: 2    Watching TV: 1    Sitting  Inactive in public: 0    As a passenger in car: 1      Lying down to rest: 1    Sitting and talking: 0    Sitting quielty after lunch: 1    In a car, stopped in traffic: 0   TOTAL SCORE:   6 out of 24    SLEEP STUDIES:  PSG (04/14/07) AHI 10, min SPO2 84%    CPAP COMPLIANCE DATA:  Date Range: 01/10/2024-04/08/2024  Average Daily Use: 4 hours 39 minutes  Median Use: 4 hours 52 minutes  Compliance for > 4 Hours: 72%  AHI: 0.6 respiratory events per hour  Days Used: 88/90 days  Mask Leak: 24.5  95th Percentile Pressure: 11.6         LABS: No results found for this or any previous visit (from the past 2160 hours).  Radiology: CT CARDIAC SCORING (SELF PAY ONLY) Addendum Date: 01/24/2023 ADDENDUM REPORT: 01/24/2023 22:49 EXAM: OVER-READ INTERPRETATION  CT CHEST The following report is an over-read performed by radiologist Dr. Fonda Mom [facility] on 01/24/2023. This over-read does not include interpretation of cardiac or coronary anatomy or pathology. The coronary calcium  score interpretation by the cardiologist is attached. COMPARISON:  10/26/2021 FINDINGS: Cardiovascular: No significant cardiovascular abnormalities identified without contrast. Mediastinum/Nodes: No suspicious adenopathy identified. Imaged mediastinal structures are unremarkable. Lungs/Pleura: Imaged lungs are clear. No pleural effusion or pneumothorax. Upper Abdomen: No acute abnormality. Musculoskeletal: No chest wall abnormality. No acute or significant osseous findings. IMPRESSION: No significant extracardiac incidental findings identified. Electronically Signed   By: Fonda Field M.D.   On: 01/24/2023 22:49   Result Date: 01/24/2023 CLINICAL DATA:  Risk stratification EXAM: Coronary Calcium  Score TECHNIQUE: The patient was scanned on a Siemens Somatom scanner. Axial non-contrast 3 mm slices were carried out through the heart. The data set was analyzed on a dedicated work  station and scored using the Agatson method. FINDINGS: Non-cardiac: See separate report from [facility]. Ascending Aorta: Normal size Pericardium: Normal Coronary arteries: Normal origin of left and right coronary arteries. Distribution  of arterial calcifications if present, as noted below; LM 0 LAD 0 LCx 0 RCA 0 Total 0 IMPRESSION AND RECOMMENDATION: 1.  Normal coronary calcium  score of 0.  Patient is low risk. 2.  CAC 0, CAC-DRS A0. 3.  Continue heart healthy lifestyle and risk factor modification. Electronically Signed: By: Redell Cave M.D. On: 01/24/2023 17:57    No results found.  No results found.    Assessment and Plan: Patient Active Problem List   Diagnosis Date Noted   Statin myopathy 01/06/2023   Crohn's colitis, without complications (HCC) 11/22/2022   Sickle cell trait (HCC) 05/06/2022   Crohn's disease of large intestine without complication (HCC) 02/20/2022   Trochanteric bursitis of right hip 10/18/2021   CPAP use counseling 08/14/2020   Insomnia related to another mental disorder    Vitamin D  deficiency 01/07/2020   Low serum vitamin B12 01/07/2020   Osteoarthritis of hip 11/12/2019   DDD (degenerative disc disease), cervical 04/19/2019   Hyperlipidemia LDL goal <70 12/16/2018   Occipital headache 09/01/2018   Dyslipidemia associated with type 2 diabetes mellitus (HCC) 04/22/2018   OSA on CPAP 09/15/2017   Chronic hip pain, right 09/09/2016   Numbness of right foot 09/09/2016   Scoliosis 03/08/2016   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 OSA on CPAP (Primary) The patient does tolerate PAP and reports  benefit from PAP use. The patient was reminded how to clean equipment and advised to replace supplies routinely. The patient was also counselled on weight loss. The compliance is fair. The AHI is 0.6.   OSA on cpap- controlled.  Continue with excellent compliance with pap. CPAP continues to be medically necessary to treat this patient's OSA. F/u one year.    2. CPAP use counseling CPAP Counseling: had a lengthy discussion with the patient regarding the importance of PAP therapy in management of the sleep apnea. Patient appears to understand the risk factor reduction and also understands the risks associated with untreated sleep apnea. Patient will try to make a good faith effort to remain compliant with therapy. Also instructed the patient on proper cleaning of the device including the water must be changed daily if possible and use of distilled water is preferred. Patient understands that the machine should be regularly cleaned with appropriate recommended cleaning solutions that do not damage the PAP machine for example given white vinegar and water rinses. Other methods such as ozone treatment may not be as good as these simple methods to achieve cleaning.      General Counseling: I have discussed the findings of the evaluation and examination with Samantha Stephens.  I have also discussed any further diagnostic evaluation thatmay be needed or ordered today. Samantha Stephens verbalizes understanding of the findings of todays visit. We also reviewed her medications today and discussed drug interactions and side effects including but not limited excessive drowsiness and altered mental states. We also discussed that there is always a risk not just to her but also people around her. she has been encouraged to call the office with any questions or concerns that should arise related to todays visit.  No orders of the defined types were placed in this encounter.       I have personally obtained a history, examined the patient, evaluated laboratory and imaging results, formulated the assessment and plan and placed orders. This patient was seen today by Samantha Lay, PA-C in collaboration  with Dr. Elfreda Bathe.   Elfreda DELENA Bathe, MD [facility] Diplomate  ABMS Pulmonary Critical Care Medicine and Sleep Medicine

## 2024-04-12 ENCOUNTER — Ambulatory Visit (INDEPENDENT_AMBULATORY_CARE_PROVIDER_SITE_OTHER): Admitting: Internal Medicine

## 2024-04-12 VITALS — BP 127/82 | HR 72 | Resp 16 | Ht 64.0 in | Wt 151.0 lb

## 2024-04-12 DIAGNOSIS — G4733 Obstructive sleep apnea (adult) (pediatric): Secondary | ICD-10-CM | POA: Diagnosis not present

## 2024-04-12 DIAGNOSIS — Z7189 Other specified counseling: Secondary | ICD-10-CM | POA: Diagnosis not present

## 2024-04-12 NOTE — Patient Instructions (Signed)

## 2024-04-29 ENCOUNTER — Ambulatory Visit
Admission: RE | Admit: 2024-04-29 | Discharge: 2024-04-29 | Disposition: A | Discharge status: Home / Self Care | Source: Ambulatory Visit | Attending: Family Medicine | Admitting: Family Medicine

## 2024-04-29 DIAGNOSIS — Z1231 Encounter for screening mammogram for malignant neoplasm of breast: Secondary | ICD-10-CM | POA: Insufficient documentation

## 2024-04-29 DIAGNOSIS — Z0000 Encounter for general adult medical examination without abnormal findings: Secondary | ICD-10-CM | POA: Diagnosis present

## 2024-04-30 DIAGNOSIS — M160 Bilateral primary osteoarthritis of hip: Secondary | ICD-10-CM | POA: Insufficient documentation

## 2024-05-02 ENCOUNTER — Other Ambulatory Visit: Payer: Self-pay | Admitting: Family Medicine

## 2024-05-02 DIAGNOSIS — E1159 Type 2 diabetes mellitus with other circulatory complications: Secondary | ICD-10-CM

## 2024-05-05 ENCOUNTER — Other Ambulatory Visit: Payer: Self-pay | Admitting: Family Medicine

## 2024-05-05 DIAGNOSIS — I152 Hypertension secondary to endocrine disorders: Secondary | ICD-10-CM

## 2024-05-05 NOTE — Telephone Encounter (Unsigned)
 Copied from CRM [id number]. Topic: Clinical - Medication Refill >> May 05, 2024  8:06 AM Jalayah J wrote: Medication: amLODipine  (NORVASC ) 5 MG tablet   Has the patient contacted their pharmacy? Yes (Agent: If no, request that the patient contact the pharmacy for the refill. If patient does not wish to contact the pharmacy document the reason why and proceed with request.) (Agent: If yes, when and what did the pharmacy advise?)  This is the patient's preferred pharmacy:  [id number] - [place], [place] - [address] AT [facility] [address] [facility] [address] [place] [address] Phone: [phone] Fax: [phone]    Is this the correct pharmacy for this prescription? Yes If no, delete pharmacy and type the correct one.   Has the prescription been filled recently? Yes  Is the patient out of the medication? Yes  Has the patient been seen for an appointment in the last year OR does the patient have an upcoming appointment? Yes  Can we respond through MyChart? Yes  Agent: Please be advised that Rx refills may take up to 3 business days. We ask that you follow-up with your pharmacy.

## 2024-05-06 NOTE — Telephone Encounter (Signed)
 Duplicate request, refilled on 05/03/24.  Requested Prescriptions  Pending Prescriptions Disp Refills   amLODipine  (NORVASC ) 5 MG tablet 90 tablet 0    Sig: TAKE 1 TABLET(5 MG) BY MOUTH DAILY     Cardiovascular: Calcium  Channel Blockers 2 Passed - 05/06/2024  1:58 PM      Passed - Last BP in normal range    BP Readings from Last 1 Encounters:  04/12/24 127/82         Passed - Last Heart Rate in normal range    Pulse Readings from Last 1 Encounters:  04/12/24 72         Passed - Valid encounter within last 6 months    Recent Outpatient Visits           1 month ago Well adult exam   [facility] Glenard Mire, MD       Future Appointments             In 2 months Sowles, Krichna, MD [facility]   In 10 months Sowles, Krichna, MD [facility], [facility]

## 2024-05-16 LAB — LIPID PANEL
Chol/HDL Ratio: 2.6 ratio (ref 0.0–4.4)
Cholesterol, Total: 202 mg/dL — ABNORMAL HIGH (ref 100–199)
HDL: 77 mg/dL (ref >39)
LDL Chol Calc (NIH): 109 mg/dL — ABNORMAL HIGH (ref 0–99)
Triglycerides: 89 mg/dL (ref 0–149)
VLDL Cholesterol Cal: 16 mg/dL (ref 5–40)

## 2024-05-16 LAB — CBC WITH DIFFERENTIAL/PLATELET
Basophils Absolute: 0.1 x10E3/uL (ref 0.0–0.2)
Basos: 1 %
EOS (ABSOLUTE): 0.4 x10E3/uL (ref 0.0–0.4)
Eos: 5 %
Hematocrit: 43.1 % (ref 34.0–46.6)
Hemoglobin: 13.8 g/dL (ref 11.1–15.9)
Immature Grans (Abs): 0 x10E3/uL (ref 0.0–0.1)
Immature Granulocytes: 0 %
Lymphocytes Absolute: 3.1 x10E3/uL (ref 0.7–3.1)
Lymphs: 40 %
MCH: 25.4 pg — ABNORMAL LOW (ref 26.6–33.0)
MCHC: 32 g/dL (ref 31.5–35.7)
MCV: 79 fL (ref 79–97)
Monocytes Absolute: 0.4 x10E3/uL (ref 0.1–0.9)
Monocytes: 5 %
Neutrophils Absolute: 3.9 x10E3/uL (ref 1.4–7.0)
Neutrophils: 49 %
Platelets: 291 x10E3/uL (ref 150–450)
RBC: 5.44 x10E6/uL — ABNORMAL HIGH (ref 3.77–5.28)
RDW: 15.7 % — ABNORMAL HIGH (ref 11.7–15.4)
WBC: 7.9 x10E3/uL (ref 3.4–10.8)

## 2024-05-16 LAB — MICROALBUMIN / CREATININE URINE RATIO
Creatinine, Urine: 20.3 mg/dL
Microalb/Creat Ratio: <15 mg/g{creat} (ref 0–29)
Microalbumin, Urine: <3 ug/mL

## 2024-05-16 LAB — QUANTIFERON-TB GOLD PLUS
QuantiFERON Mitogen Value: >10 [IU]/mL
QuantiFERON Nil Value: 0.08 [IU]/mL
QuantiFERON TB1 Ag Value: 0.06 [IU]/mL
QuantiFERON TB2 Ag Value: 0.06 [IU]/mL
QuantiFERON-TB Gold Plus: NEGATIVE

## 2024-05-16 LAB — COMPREHENSIVE METABOLIC PANEL WITH GFR
ALT: 17 IU/L (ref 0–32)
AST: 20 IU/L (ref 0–40)
Albumin: 4.6 g/dL (ref 3.9–4.9)
Alkaline Phosphatase: 80 IU/L (ref 44–121)
BUN/Creatinine Ratio: 15 (ref 12–28)
BUN: 15 mg/dL (ref 8–27)
Bilirubin Total: 0.3 mg/dL (ref 0.0–1.2)
CO2: 22 mmol/L (ref 20–29)
Calcium: 9.3 mg/dL (ref 8.7–10.3)
Chloride: 99 mmol/L (ref 96–106)
Creatinine, Ser: 0.97 mg/dL (ref 0.57–1.00)
Globulin, Total: 2.5 g/dL (ref 1.5–4.5)
Glucose: 142 mg/dL — ABNORMAL HIGH (ref 70–99)
Potassium: 4.3 mmol/L (ref 3.5–5.2)
Sodium: 139 mmol/L (ref 134–144)
Total Protein: 7.1 g/dL (ref 6.0–8.5)
eGFR: 66 mL/min/1.73 (ref >59)

## 2024-05-16 LAB — B12 AND FOLATE PANEL
Folate: >20 ng/mL (ref >3.0)
Vitamin B-12: 1860 pg/mL — ABNORMAL HIGH (ref 232–1245)

## 2024-05-16 LAB — VITAMIN D 25 HYDROXY (VIT D DEFICIENCY, FRACTURES): Vit D, 25-Hydroxy: 39.1 ng/mL (ref 30.0–100.0)

## 2024-05-18 ENCOUNTER — Ambulatory Visit: Payer: Self-pay | Admitting: Family Medicine

## 2024-05-24 ENCOUNTER — Other Ambulatory Visit: Payer: Self-pay | Admitting: Family Medicine

## 2024-05-24 DIAGNOSIS — E1169 Type 2 diabetes mellitus with other specified complication: Secondary | ICD-10-CM

## 2024-05-24 NOTE — Telephone Encounter (Signed)
 Pt needs to be seen sooner than Oct for a follow up please schedule sooner and will refill after.

## 2024-05-24 NOTE — Telephone Encounter (Signed)
 Lvm asking pt to return call to reschedule appointment for a sooner date.

## 2024-05-25 NOTE — Telephone Encounter (Signed)
 Pt already have appt for October. Per Dr Glenard last not she asked her to return in September. Are you needing her to come in before that September date?

## 2024-06-04 ENCOUNTER — Ambulatory Visit: Admitting: Family Medicine

## 2024-06-04 ENCOUNTER — Telehealth: Payer: Self-pay

## 2024-06-04 ENCOUNTER — Encounter: Payer: Self-pay | Admitting: Family Medicine

## 2024-06-04 ENCOUNTER — Other Ambulatory Visit (HOSPITAL_COMMUNITY): Payer: Self-pay

## 2024-06-04 VITALS — BP 130/78 | HR 91 | Temp 97.7°F | Resp 16 | Ht 64.0 in | Wt 148.6 lb

## 2024-06-04 DIAGNOSIS — G8929 Other chronic pain: Secondary | ICD-10-CM

## 2024-06-04 DIAGNOSIS — I152 Hypertension secondary to endocrine disorders: Secondary | ICD-10-CM

## 2024-06-04 DIAGNOSIS — B3731 Acute candidiasis of vulva and vagina: Secondary | ICD-10-CM

## 2024-06-04 DIAGNOSIS — G4733 Obstructive sleep apnea (adult) (pediatric): Secondary | ICD-10-CM

## 2024-06-04 DIAGNOSIS — K5010 Crohn's disease of large intestine without complications: Secondary | ICD-10-CM | POA: Diagnosis not present

## 2024-06-04 DIAGNOSIS — E785 Hyperlipidemia, unspecified: Secondary | ICD-10-CM

## 2024-06-04 DIAGNOSIS — E1169 Type 2 diabetes mellitus with other specified complication: Secondary | ICD-10-CM | POA: Diagnosis not present

## 2024-06-04 DIAGNOSIS — E1159 Type 2 diabetes mellitus with other circulatory complications: Secondary | ICD-10-CM | POA: Diagnosis not present

## 2024-06-04 DIAGNOSIS — D573 Sickle-cell trait: Secondary | ICD-10-CM

## 2024-06-04 DIAGNOSIS — G720 Drug-induced myopathy: Secondary | ICD-10-CM

## 2024-06-04 DIAGNOSIS — M160 Bilateral primary osteoarthritis of hip: Secondary | ICD-10-CM

## 2024-06-04 LAB — POCT GLYCOSYLATED HEMOGLOBIN (HGB A1C): Hemoglobin A1C: 6.8 % — AB (ref 4.0–5.6)

## 2024-06-04 MED ORDER — VASCEPA 1 G PO CAPS: 2.0000 g | ORAL_CAPSULE | Freq: Two times a day (BID) | ORAL | 1 refills | Status: AC

## 2024-06-04 MED ORDER — FLUCONAZOLE 150 MG PO TABS: 150.0000 mg | ORAL_TABLET | ORAL | 0 refills | Status: AC

## 2024-06-04 MED ORDER — DAPAGLIFLOZIN PRO-METFORMIN ER 10-1000 MG PO TB24: 1.0000 | ORAL_TABLET | Freq: Every day | ORAL | 1 refills | Status: AC

## 2024-06-04 MED ORDER — PIOGLITAZONE HCL 15 MG PO TABS: 15.0000 mg | ORAL_TABLET | Freq: Every day | ORAL | 1 refills | Status: AC

## 2024-06-04 MED ORDER — AMLODIPINE BESYLATE 5 MG PO TABS
5.0000 mg | ORAL_TABLET | Freq: Every day | ORAL | 1 refills | Status: AC
Start: 2024-06-04 — End: ?

## 2024-06-04 MED ORDER — GABAPENTIN 100 MG PO CAPS: 100.0000 mg | ORAL_CAPSULE | Freq: Three times a day (TID) | ORAL | 0 refills | Status: DC

## 2024-06-04 NOTE — Telephone Encounter (Signed)
 Prior authorization submitted for VASCEPA  1GM to [employer] via Latent.   Key: [id number]

## 2024-06-04 NOTE — Progress Notes (Signed)
 Name: Samantha Stephens   MRN: [id number]    DOB: [birth year]   Date:06/04/2024       Progress Note  Subjective  Chief Complaint  Chief Complaint  Patient presents with   Medical Management of Chronic Issues   Hip Pain   Discussed the use of AI scribe software for clinical note transcription with the patient, who gave verbal consent to proceed.  History of Present Illness Samantha Stephens is a 63 year old female with osteoarthritis and Crohn's disease who presents with worsening hip pain.  She experiences severe hip pain, describing it as 'killing me' and causing her to hobble, especially after being active. The pain significantly impacted her mobility during a recent vacation. In July, she consulted [facility]  where she was told she has severe arthritis and that hip replacement surgery may be needed. A corticosteroid injection in the left hip provided temporary relief but did not resolve the issue. The pain is severe, affecting her ability to squat, bend, and pick up objects.  She has been on Entyvio  for Crohn's disease for about two years and suspects it may be contributing to her joint pain, as she noticed increased stiffness and pain following her last infusion. No gastrointestinal symptoms such as blood in stools or abdominal pain have been reported recently.  She uses a CPAP machine nightly for sleep apnea and feels it helps.   She experiences muscle aches with statin therapy and is currently on Vascepa  for cholesterol management.   Her diabetes is managed with Xigduo  and pioglitazone , and her last A1c was 6.8%. she denies polyphagia , polydipsia or polyuria . She has associated HTN and dyslipidemia and well managed with medications  Sickle Cell Trait: cbc stable  She reports modifying her physical activities due to hip pain, engaging in water aerobics and walking, but finds it challenging to maintain her previous level of activity. She is concerned about the impact of her pain  on her quality of life, stating it affects her motivation and ability to stay active.    Patient Active Problem List   Diagnosis Date Noted   Statin myopathy 01/06/2023   Crohn's colitis, without complications (HCC) 11/22/2022   Sickle cell trait (HCC) 05/06/2022   Crohn's disease of large intestine without complication (HCC) 02/20/2022   Trochanteric bursitis of right hip 10/18/2021   CPAP use counseling 08/14/2020   Insomnia related to another mental disorder    Vitamin D  deficiency 01/07/2020   Low serum vitamin B12 01/07/2020   Osteoarthritis of hip 11/12/2019   DDD (degenerative disc disease), cervical 04/19/2019   Hyperlipidemia LDL goal <70 12/16/2018   Occipital headache 09/01/2018   Dyslipidemia associated with type 2 diabetes mellitus (HCC) 04/22/2018   OSA on CPAP 09/15/2017   Chronic hip pain, right 09/09/2016   Numbness of right foot 09/09/2016   Scoliosis 03/08/2016   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   Situational anxiety 10/27/2015   Essential hypertension 08/18/2015   Chronic insomnia 08/18/2015    Past Surgical History:  Procedure Laterality Date   ABDOMINAL HYSTERECTOMY     CESAREAN SECTION     1   COLONOSCOPY     COLONOSCOPY WITH PROPOFOL  N/A 06/18/2021   Procedure: COLONOSCOPY WITH PROPOFOL ;  Surgeon: Unk Corinn Skiff, MD;  Location: [facility] ENDOSCOPY;  Service: Gastroenterology;  Laterality: N/A;   COLONOSCOPY WITH PROPOFOL  N/A 11/22/2022   Procedure: COLONOSCOPY WITH PROPOFOL ;  Surgeon: Unk Corinn Skiff, MD;  Location: [facility] ENDOSCOPY;  Service: Gastroenterology;  Laterality: N/A;   CYSTOSCOPY W/ RETROGRADES Bilateral 11/25/2016   Procedure: CYSTOSCOPY WITH RETROGRADE PYELOGRAM;  Surgeon: Rosina Riis, MD;  Location: [facility] ORS;  Service: Urology;  Laterality: Bilateral;   EYE SURGERY     lasix eye surgery    Family History  Problem Relation Age of Onset   Arthritis Mother    COPD Mother    Depression Mother    Diabetes Mother     Hyperlipidemia Mother    Hypertension Mother    Cataracts Mother    Heart disease Mother    Hearing loss Father    Kidney disease Father    Cataracts Father    Cancer - Prostate Father        kidney and prostate cancer, colon cancer   Asthma Sister    Mental illness Sister    Cancer Paternal Grandmother    Breast cancer Other     Social History   Tobacco Use   Smoking status: Former    Current packs/day: 0.00    Types: Cigarettes    Quit date: 1990    Years since quitting: 35.6   Smokeless tobacco: Never   Tobacco comments:    but only for a short amount of time  Substance Use Topics   Alcohol use: Yes    Alcohol/week: 0.0 - 1.0 standard drinks of alcohol    Comment: occasional glass of wine     Current Outpatient Medications:    amLODipine  (NORVASC ) 5 MG tablet, TAKE 1 TABLET(5 MG) BY MOUTH DAILY, Disp: 90 tablet, Rfl: 0   Dapagliflozin  Pro-metFORMIN  ER (XIGDUO  XR) 07-999 MG TB24, Take 1 tablet by mouth daily., Disp: 90 tablet, Rfl: 1   Multiple Vitamin (MULTIVITAMIN) tablet, Take 1 tablet by mouth daily., Disp: , Rfl:    pioglitazone  (ACTOS ) 15 MG tablet, TAKE 1 TABLET(15 MG) BY MOUTH DAILY, Disp: 30 tablet, Rfl: 1   triamcinolone  cream (KENALOG ) 0.1 %, Apply 1 application topically 2 (two) times daily., Disp: 30 g, Rfl: 1   VASCEPA  1 g capsule, TAKE 2 CAPSULES(2 GRAMS) BY MOUTH TWICE DAILY, Disp: 120 capsule, Rfl: 0   vedolizumab  (ENTYVIO ) 300 MG injection, Inject 300 mg into the vein every 8 (eight) weeks., Disp: 1 each, Rfl: 2   Cholecalciferol (VITAMIN D3) 2000 units TABS, Take 1 capsule by mouth daily. (Patient not taking: Reported on 06/04/2024), Disp: , Rfl:    Cyanocobalamin (B-12) 500 MCG SUBL, Place 1 tablet under the tongue 3 (three) times a week. (Patient not taking: Reported on 06/04/2024), Disp: , Rfl:    fluconazole  (DIFLUCAN ) 150 MG tablet, Take 1 tablet (150 mg total) by mouth every other day. For three doses weekly prn (Patient not taking: Reported on  06/04/2024), Disp: 12 tablet, Rfl: 0  Allergies  Allergen Reactions   Trazodone  And Nefazodone Other (See Comments)    Syncope; see in ER Feb 2018   Benicar  [Olmesartan ] Cough   Statins    Zetia  [Ezetimibe ]     I personally reviewed active problem list, medication list, allergies, family history with the patient/caregiver today.   ROS  Ten systems reviewed and is negative except as mentioned in HPI    Objective Physical Exam CONSTITUTIONAL: Patient appears well-developed and well-nourished.  No distress. HEENT: Head atraumatic, normocephalic, neck supple. CARDIOVASCULAR: Normal rate, regular rhythm and normal heart sounds.  No murmur heard. No BLE edema. PULMONARY: Effort normal and breath sounds normal. No respiratory distress. MUSCULOSKELETAL: antalgic gait  PSYCHIATRIC: Patient has a normal mood and affect. behavior  is normal. Judgment and thought content normal.  Vitals:   06/04/24 1307  Pulse: 91  Resp: 16  SpO2: 97%  Weight: 148 lb 9.6 oz (67.4 kg)  Height: 5' 4 (1.626 m)    Body mass index is 25.51 kg/m.  Recent Results (from the past 2160 hours)  Lipid panel     Status: Abnormal   Collection Time: 05/12/24 10:43 AM  Result Value Ref Range   Cholesterol, Total 202 (H) 100 - 199 mg/dL   Triglycerides 89 0 - 149 mg/dL   HDL 77 >60 mg/dL   VLDL Cholesterol Cal 16 5 - 40 mg/dL   LDL Chol Calc (NIH) 890 (H) 0 - 99 mg/dL   Chol/HDL Ratio 2.6 0.0 - 4.4 ratio    Comment:                                   T. Chol/HDL Ratio                                             Men  Women                               1/2 Avg.Risk  3.4    3.3                                   Avg.Risk  5.0    4.4                                2X Avg.Risk  9.6    7.1                                3X Avg.Risk 23.4   11.0   Microalbumin / creatinine urine ratio     Status: None   Collection Time: 05/12/24 10:43 AM  Result Value Ref Range   Creatinine, Urine 20.3 Not Estab. mg/dL    Microalbumin, Urine <3.0 Not Estab. ug/mL   Microalb/Creat Ratio <15 0 - 29 mg/g creat    Comment:                        Normal:                0 -  29                        Moderately increased: 30 - 300                        Severely increased:       >300   CBC with Differential/Platelet     Status: Abnormal   Collection Time: 05/12/24 10:43 AM  Result Value Ref Range   WBC 7.9 3.4 - 10.8 x10E3/uL   RBC 5.44 (H) 3.77 - 5.28 x10E6/uL   Hemoglobin 13.8 11.1 - 15.9 g/dL   Hematocrit 56.8 65.9 - 46.6 %   MCV 79 79 - 97 fL   MCH 25.4 (L)  26.6 - 33.0 pg   MCHC 32.0 31.5 - 35.7 g/dL   RDW 84.2 (H) 88.2 - 84.5 %   Platelets 291 150 - 450 x10E3/uL   Neutrophils 49 Not Estab. %   Lymphs 40 Not Estab. %   Monocytes 5 Not Estab. %   Eos 5 Not Estab. %   Basos 1 Not Estab. %   Neutrophils Absolute 3.9 1.4 - 7.0 x10E3/uL   Lymphocytes Absolute 3.1 0.7 - 3.1 x10E3/uL   Monocytes Absolute 0.4 0.1 - 0.9 x10E3/uL   EOS (ABSOLUTE) 0.4 0.0 - 0.4 x10E3/uL   Basophils Absolute 0.1 0.0 - 0.2 x10E3/uL   Immature Granulocytes 0 Not Estab. %   Immature Grans (Abs) 0.0 0.0 - 0.1 x10E3/uL  Comprehensive metabolic panel with GFR     Status: Abnormal   Collection Time: 05/12/24 10:43 AM  Result Value Ref Range   Glucose 142 (H) 70 - 99 mg/dL   BUN 15 8 - 27 mg/dL   Creatinine, Ser 9.02 0.57 - 1.00 mg/dL   eGFR 66 >40 fO/fpw/8.26   BUN/Creatinine Ratio 15 12 - 28   Sodium 139 134 - 144 mmol/L   Potassium 4.3 3.5 - 5.2 mmol/L   Chloride 99 96 - 106 mmol/L   CO2 22 20 - 29 mmol/L   Calcium  9.3 8.7 - 10.3 mg/dL   Total Protein 7.1 6.0 - 8.5 g/dL   Albumin 4.6 3.9 - 4.9 g/dL   Globulin, Total 2.5 1.5 - 4.5 g/dL   Bilirubin Total 0.3 0.0 - 1.2 mg/dL   Alkaline Phosphatase 80 44 - 121 IU/L   AST 20 0 - 40 IU/L   ALT 17 0 - 32 IU/L  B12 and Folate Panel     Status: Abnormal   Collection Time: 05/12/24 10:43 AM  Result Value Ref Range   Vitamin B-12 1,860 (H) 232 - 1,245 pg/mL   Folate >20.0 >3.0  ng/mL    Comment: A serum folate concentration of less than 3.1 ng/mL is considered to represent clinical deficiency.   VITAMIN D  25 Hydroxy (Vit-D Deficiency, Fractures)     Status: None   Collection Time: 05/12/24 10:43 AM  Result Value Ref Range   Vit D, 25-Hydroxy 39.1 30.0 - 100.0 ng/mL    Comment: Vitamin D  deficiency has been defined by the Institute of Medicine and an Endocrine Society practice guideline as a level of serum 25-OH vitamin D  less than 20 ng/mL (1,2). The Endocrine Society went on to further define vitamin D  insufficiency as a level between 21 and 29 ng/mL (2). 1. IOM (Institute of Medicine). 2010. Dietary reference    intakes for calcium  and D. [place]: The    [employer]. 2. Holick MF, Binkley [place], Bischoff-Ferrari HA, et al.    Evaluation, treatment, and prevention of vitamin D     deficiency: an Endocrine Society clinical practice    guideline. JCEM. 2011 Jul; 96(7):1911-30.   QuantiFERON-TB Gold Plus     Status: None   Collection Time: 05/12/24 10:43 AM  Result Value Ref Range   QuantiFERON Incubation Incubation performed.    QuantiFERON Criteria Comment     Comment: QuantiFERON-TB Gold Plus is a qualitative indirect test for M tuberculosis infection (including disease) and is intended for use in conjunction with risk assessment, radiography, and other medical and diagnostic evaluations. The QuantiFERON-TB Gold Plus result is determined by subtracting the Nil value from either TB antigen (Ag) value. The Mitogen tube serves as a control for the  test.    QuantiFERON TB1 Ag Value 0.06 IU/mL   QuantiFERON TB2 Ag Value 0.06 IU/mL   QuantiFERON Nil Value 0.08 IU/mL   QuantiFERON Mitogen Value >10.00 IU/mL   QuantiFERON-TB Gold Plus Negative Negative    Comment: No response to M tuberculosis antigens detected. Infection with M tuberculosis is unlikely, but high risk individuals should be considered for additional testing (ATS/IDSA/CDC  Clinical Practice Guidelines, 2017). The reference range is an Antigen minus Nil result of <0.35 IU/mL. Chemiluminescence immunoassay methodology     Diabetic Foot Exam:     PHQ2/9:    06/04/2024    1:01 PM 03/12/2024    7:58 AM 08/12/2023    8:17 AM 05/08/2023    7:47 AM 03/11/2023    9:32 AM  Depression screen PHQ 2/9  Decreased Interest 0 0 0 0 0  Down, Depressed, Hopeless 0 0 0 0 0  PHQ - 2 Score 0 0 0 0 0  Altered sleeping  0 0 0 0  Tired, decreased energy  0 0 0 0  Change in appetite  0 0 0 0  Feeling bad or failure about yourself   0 0 0 0  Trouble concentrating  0 0 0 0  Moving slowly or fidgety/restless  0 0 0 0  Suicidal thoughts  0 0 0 0  PHQ-9 Score  0 0 0 0  Difficult doing work/chores  Not difficult at all       phq 9 is negative  Fall Risk:    06/04/2024    1:01 PM 03/12/2024    7:58 AM 08/12/2023    8:17 AM 05/08/2023    7:47 AM 03/11/2023    9:32 AM  Fall Risk   Falls in the past year? 0 0 0 0 0  Number falls in past yr: 0 0 0 0   Injury with Fall? 0 0 0 0   Risk for fall due to : No Fall Risks No Fall Risks No Fall Risks No Fall Risks No Fall Risks  Follow up Falls evaluation completed Falls prevention discussed;Education provided;Falls evaluation completed Falls prevention discussed Falls prevention discussed Falls prevention discussed;Education provided;Falls evaluation completed     Assessment & Plan Osteoarthritis of both hips with chronic pain Chronic osteoarthritis in both hips with significant pain and stiffness, particularly in the left hip. Pain exacerbated by activity and minimally relieved by corticosteroid injection. Possible contribution of Entyvio  to joint pain and stiffness. Hip replacement surgery recommended but delayed due to recent corticosteroid injection. - Refer to GI doctor to discuss potential change or gap in Entyvio  treatment to assess impact on joint pain. - Consider hip replacement surgery after three-month waiting period  post-corticosteroid injection. - Prescribe gabapentin  100 mg at night for pain management, with instructions to increase to 300 mg if needed.  Type 2 diabetes mellitus associated dyslipidemia and HTN  Type 2 diabetes mellitus, currently well-controlled with A1c at 6.8%. Recent corticosteroid injection may have temporarily elevated blood glucose levels. - Continue current diabetes medications: Xigduo  and pioglitazone . - Monitor blood glucose levels, especially after corticosteroid injections.  Hypertension Hypertension, well-controlled with current medication regimen. - Continue amlodipine  5 mg daily.  Crohn's disease in remission Crohn's disease currently in remission with no gastrointestinal symptoms reported. Concerns about Entyvio  contributing to joint pain. She has been on Entyvio  for two years and noticed increased joint pain after recent infusion. - Discuss with GI doctor the possibility of changing or pausing Entyvio  to assess impact on joint pain.  Obstructive sleep apnea Obstructive sleep apnea managed with CPAP therapy, which is used consistently. - Continue CPAP therapy.  Statin-induced myopathy Statin-induced myopathy, currently managed with Vascepa  for cholesterol control. - Continue Vascepa  2 grams twice daily. - Avoid statins due to history of myopathy.

## 2024-06-07 NOTE — Telephone Encounter (Signed)
 Pharmacy Patient Advocate Encounter  Received notification from [employer] that Prior Authorization for VASCEPA  has been APPROVED from 06/04/24 to FURTHER NOTICE (10/20/38)   PA #/Case ID/Reference #: : [id number]

## 2024-07-06 ENCOUNTER — Other Ambulatory Visit: Payer: Self-pay | Admitting: Family Medicine

## 2024-07-06 DIAGNOSIS — G8929 Other chronic pain: Secondary | ICD-10-CM

## 2024-07-12 ENCOUNTER — Emergency Department
Admission: EM | Admit: 2024-07-12 | Discharge: 2024-07-12 | Discharge status: Against Medical Advice | Attending: Emergency Medicine | Admitting: Emergency Medicine

## 2024-07-12 ENCOUNTER — Other Ambulatory Visit: Payer: Self-pay

## 2024-07-12 ENCOUNTER — Encounter: Payer: Self-pay | Admitting: Emergency Medicine

## 2024-07-12 ENCOUNTER — Encounter: Payer: Self-pay | Admitting: Physician Assistant

## 2024-07-12 DIAGNOSIS — I10 Essential (primary) hypertension: Secondary | ICD-10-CM | POA: Insufficient documentation

## 2024-07-12 DIAGNOSIS — R519 Headache, unspecified: Secondary | ICD-10-CM | POA: Diagnosis present

## 2024-07-12 DIAGNOSIS — E1165 Type 2 diabetes mellitus with hyperglycemia: Secondary | ICD-10-CM | POA: Diagnosis not present

## 2024-07-12 DIAGNOSIS — Z5321 Procedure and treatment not carried out due to patient leaving prior to being seen by health care provider: Secondary | ICD-10-CM | POA: Insufficient documentation

## 2024-07-12 DIAGNOSIS — H538 Other visual disturbances: Secondary | ICD-10-CM | POA: Diagnosis not present

## 2024-07-12 LAB — COMPREHENSIVE METABOLIC PANEL WITH GFR
ALT: 19 U/L (ref 0–44)
AST: 25 U/L (ref 15–41)
Albumin: 4.5 g/dL (ref 3.5–5.0)
Alkaline Phosphatase: 70 U/L (ref 38–126)
Anion gap: 12 (ref 5–15)
BUN: 13 mg/dL (ref 8–23)
CO2: 26 mmol/L (ref 22–32)
Calcium: 9.5 mg/dL (ref 8.9–10.3)
Chloride: 101 mmol/L (ref 98–111)
Creatinine, Ser: 0.87 mg/dL (ref 0.44–1.00)
GFR, Estimated: >60 mL/min (ref >60)
Glucose, Bld: 170 mg/dL — ABNORMAL HIGH (ref 70–99)
Potassium: 3.5 mmol/L (ref 3.5–5.1)
Sodium: 139 mmol/L (ref 135–145)
Total Bilirubin: 0.5 mg/dL (ref 0.0–1.2)
Total Protein: 7.9 g/dL (ref 6.5–8.1)

## 2024-07-12 LAB — CBC WITH DIFFERENTIAL/PLATELET
Abs Immature Granulocytes: 0.01 K/uL (ref 0.00–0.07)
Basophils Absolute: 0.1 K/uL (ref 0.0–0.1)
Basophils Relative: 1 %
Eosinophils Absolute: 0.4 K/uL (ref 0.0–0.5)
Eosinophils Relative: 5 %
HCT: 39.9 % (ref 36.0–46.0)
Hemoglobin: 13.8 g/dL (ref 12.0–15.0)
Immature Granulocytes: 0 %
Lymphocytes Relative: 46 %
Lymphs Abs: 3.8 K/uL (ref 0.7–4.0)
MCH: 26 pg (ref 26.0–34.0)
MCHC: 34.6 g/dL (ref 30.0–36.0)
MCV: 75.1 fL — ABNORMAL LOW (ref 80.0–100.0)
Monocytes Absolute: 0.4 K/uL (ref 0.1–1.0)
Monocytes Relative: 5 %
Neutro Abs: 3.5 K/uL (ref 1.7–7.7)
Neutrophils Relative %: 43 %
Platelets: 274 K/uL (ref 150–400)
RBC: 5.31 MIL/uL — ABNORMAL HIGH (ref 3.87–5.11)
RDW: 15.1 % (ref 11.5–15.5)
WBC: 8.1 K/uL (ref 4.0–10.5)
nRBC: 0 % (ref 0.0–0.2)

## 2024-07-12 LAB — RESP PANEL BY RT-PCR (RSV, FLU A&B, COVID)  RVPGX2
Influenza A by PCR: NEGATIVE
Influenza B by PCR: NEGATIVE
Resp Syncytial Virus by PCR: NEGATIVE
SARS Coronavirus 2 by RT PCR: NEGATIVE

## 2024-07-12 LAB — CBG MONITORING, ED: Glucose-Capillary: 165 mg/dL — ABNORMAL HIGH (ref 70–99)

## 2024-07-12 NOTE — ED Triage Notes (Signed)
 Pt presents to the ED via POV with complaints of bilateral facial tightness under her eyes that started this AM. Hx of HTN and DM - endorses compliance with meds. A&Ox4 at this time. Denies numbness, vision changes, dizziness, fevers, chills, CP or SOB.    CBG- 165

## 2024-07-21 ENCOUNTER — Ambulatory Visit: Admitting: Family Medicine

## 2024-07-26 ENCOUNTER — Telehealth: Payer: Self-pay

## 2024-07-26 ENCOUNTER — Other Ambulatory Visit: Payer: Self-pay | Admitting: Family Medicine

## 2024-07-26 NOTE — Telephone Encounter (Signed)
 Pt advised to call pharmacy to request a refill since she has remaining refills

## 2024-07-26 NOTE — Telephone Encounter (Signed)
 Copied from CRM [id number]. Topic: Clinical - Medication Question >> Jul 26, 2024  8:13 AM Revonda D wrote: Reason for CRM: Pt stated that she is going out of town early tomorrow and needs the  amLODipine  (NORVASC ) 5 MG tablet refilled today. Pt stated that she spoke with the pharmacy and they stated that she would have to wait until Tuesday to get the medication refilled. Pt stated that she only has one pill left and needs this medication refilled today. Pt would like a callback with an update on this request.

## 2024-10-08 ENCOUNTER — Ambulatory Visit: Admitting: Family Medicine

## 2024-11-17 ENCOUNTER — Other Ambulatory Visit: Payer: Self-pay | Admitting: Orthopedic Surgery

## 2024-11-24 ENCOUNTER — Inpatient Hospital Stay: Admission: RE | Admit: 2024-11-24 | Discharge: 2024-11-24 | Attending: Orthopedic Surgery

## 2024-11-24 ENCOUNTER — Other Ambulatory Visit: Payer: Self-pay

## 2024-11-24 DIAGNOSIS — Z01812 Encounter for preprocedural laboratory examination: Secondary | ICD-10-CM

## 2024-11-24 DIAGNOSIS — E119 Type 2 diabetes mellitus without complications: Secondary | ICD-10-CM | POA: Insufficient documentation

## 2024-11-24 DIAGNOSIS — Z01818 Encounter for other preprocedural examination: Secondary | ICD-10-CM | POA: Insufficient documentation

## 2024-11-24 HISTORY — DX: Sickle-cell trait: D57.3

## 2024-11-24 HISTORY — DX: Other specified anxiety disorders: F41.8

## 2024-11-24 HISTORY — DX: Obstructive sleep apnea (adult) (pediatric): G47.33

## 2024-11-24 HISTORY — DX: Unspecified atrial fibrillation: I48.91

## 2024-11-24 HISTORY — DX: Type 2 diabetes mellitus without complications: E11.9

## 2024-11-24 HISTORY — DX: Crohn's disease, unspecified, without complications: K50.90

## 2024-11-24 HISTORY — DX: Psychophysiologic insomnia: F51.04

## 2024-11-24 HISTORY — DX: Unilateral primary osteoarthritis, right hip: M16.11

## 2024-11-24 HISTORY — DX: Vitamin D deficiency, unspecified: E55.9

## 2024-11-24 HISTORY — DX: Deficiency of other specified B group vitamins: E53.8

## 2024-11-24 LAB — TYPE AND SCREEN
ABO/RH(D): O POS
Antibody Screen: NEGATIVE

## 2024-11-24 LAB — CBC WITH DIFFERENTIAL/PLATELET
Abs Immature Granulocytes: 0.01 10*3/uL (ref 0.00–0.07)
Basophils Absolute: 0.1 10*3/uL (ref 0.0–0.1)
Basophils Relative: 1 %
Eosinophils Absolute: 0.2 10*3/uL (ref 0.0–0.5)
Eosinophils Relative: 4 %
HCT: 38.3 % (ref 36.0–46.0)
Hemoglobin: 13.3 g/dL (ref 12.0–15.0)
Immature Granulocytes: 0 %
Lymphocytes Relative: 43 %
Lymphs Abs: 2.6 10*3/uL (ref 0.7–4.0)
MCH: 25.6 pg — ABNORMAL LOW (ref 26.0–34.0)
MCHC: 34.7 g/dL (ref 30.0–36.0)
MCV: 73.8 fL — ABNORMAL LOW (ref 80.0–100.0)
Monocytes Absolute: 0.3 10*3/uL (ref 0.1–1.0)
Monocytes Relative: 6 %
Neutro Abs: 2.8 10*3/uL (ref 1.7–7.7)
Neutrophils Relative %: 46 %
Platelets: 269 10*3/uL (ref 150–400)
RBC: 5.19 MIL/uL — ABNORMAL HIGH (ref 3.87–5.11)
RDW: 14.6 % (ref 11.5–15.5)
WBC: 6 10*3/uL (ref 4.0–10.5)
nRBC: 0 % (ref 0.0–0.2)

## 2024-11-24 LAB — COMPREHENSIVE METABOLIC PANEL WITH GFR
ALT: 14 U/L (ref 0–44)
AST: 20 U/L (ref 15–41)
Albumin: 4.4 g/dL (ref 3.5–5.0)
Alkaline Phosphatase: 80 U/L (ref 38–126)
Anion gap: 11 (ref 5–15)
BUN: 15 mg/dL (ref 8–23)
CO2: 28 mmol/L (ref 22–32)
Calcium: 9.3 mg/dL (ref 8.9–10.3)
Chloride: 103 mmol/L (ref 98–111)
Creatinine, Ser: 0.91 mg/dL (ref 0.44–1.00)
GFR, Estimated: >60 mL/min
Glucose, Bld: 100 mg/dL — ABNORMAL HIGH (ref 70–99)
Potassium: 4.1 mmol/L (ref 3.5–5.1)
Sodium: 142 mmol/L (ref 135–145)
Total Bilirubin: 0.4 mg/dL (ref 0.0–1.2)
Total Protein: 7.3 g/dL (ref 6.5–8.1)

## 2024-11-24 LAB — SURGICAL PCR SCREEN
MRSA, PCR: NEGATIVE
Staphylococcus aureus: NEGATIVE

## 2024-11-24 LAB — HEMOGLOBIN A1C
Hgb A1c MFr Bld: 6.6 % — ABNORMAL HIGH (ref 4.8–5.6)
Mean Plasma Glucose: 142.72 mg/dL

## 2024-11-24 LAB — URINALYSIS, ROUTINE W REFLEX MICROSCOPIC
Bilirubin Urine: NEGATIVE
Glucose, UA: >=500 mg/dL — AB
Hgb urine dipstick: NEGATIVE
Ketones, ur: NEGATIVE mg/dL
Leukocytes,Ua: NEGATIVE
Nitrite: NEGATIVE
Protein, ur: NEGATIVE mg/dL
RBC / HPF: 0 RBC/hpf (ref 0–5)
Specific Gravity, Urine: 1.015 (ref 1.005–1.030)
Squamous Epithelial / HPF: 0 /HPF (ref 0–5)
pH: 6 (ref 5.0–8.0)

## 2024-11-24 NOTE — Patient Instructions (Addendum)
 Your procedure is scheduled on:12-01-24 Wednesday Report to the Registration Desk on the 1st floor of the [facility].Then proceed to the 2nd floor Surgery Desk To find out your arrival time, please call [phone] between 1PM - 3PM on:11-30-24 Tuesday If your arrival time is 6:00 am, do not arrive before that time as the [facility] entrance doors do not open until 6:00 am.  REMEMBER: Instructions that are not followed completely may result in serious medical risk, up to and including death; or upon the discretion of your surgeon and anesthesiologist your surgery may need to be rescheduled.  Do not eat food after midnight the night before surgery.  No gum chewing or hard candies.  You may however, drink Water up to 2 hours before you are scheduled to arrive for your surgery. Do not drink anything within 2 hours of your scheduled arrival time.  In addition, your doctor has ordered for you to drink the provided:  Gatorade G2 Drinking this carbohydrate drink up to two hours before surgery helps to reduce insulin resistance and improve patient outcomes. Please complete drinking 2 hours before scheduled arrival time.  One week prior to surgery:Stop NOW (11-24-24) Stop Anti-inflammatories (NSAIDS) such as Advil, Aleve, Ibuprofen, Motrin, Naproxen, Naprosyn and Aspirin based products such as Excedrin, Goody's Powder, BC Powder. Stop ANY OVER THE COUNTER supplements until after surgery (Multivitamin)  You may however, continue to take Tylenol if needed for pain up until the day of surgery.  Stop Dapagliflozin  Pro-metFORMIN  ER (XIGDUO  XR) 3 days prior to surgery-Last dose will be on 11-27-24 (Saturday)  Continue taking all of your other prescription medications up until the day of surgery.  ON THE DAY OF SURGERY ONLY TAKE THESE MEDICATIONS WITH SIPS OF WATER: -amLODipine  (NORVASC )   No Alcohol for 24 hours before or after surgery.  No Smoking including e-cigarettes for 24 hours before  surgery.  No chewable tobacco products for at least 6 hours before surgery.  No nicotine patches on the day of surgery.  Do not use any recreational drugs for at least a week (preferably 2 weeks) before your surgery.  Please be advised that the combination of cocaine and anesthesia may have negative outcomes, up to and including death. If you test positive for cocaine, your surgery will be cancelled.  On the morning of surgery brush your teeth with toothpaste and water, you may rinse your mouth with mouthwash if you wish. Do not swallow any toothpaste or mouthwash.  Use CHG Soap as directed on instruction sheet.  Bring your C-Pap machine to the hospital  Do not wear jewelry, make-up, hairpins, clips or nail polish.  For welded (permanent) jewelry: bracelets, anklets, waist bands, etc.  Please have this removed prior to surgery.  If it is not removed, there is a chance that hospital personnel will need to cut it off on the day of surgery.  Do not wear lotions, powders, or perfumes.   Do not shave body hair from the neck down 48 hours before surgery.  Contact lenses, hearing aids and dentures may not be worn into surgery.  Do not bring valuables to the hospital. [facility] is not responsible for any missing/lost belongings or valuables.   Notify your doctor if there is any change in your medical condition (cold, fever, infection).  Wear comfortable clothing (specific to your surgery type) to the hospital.  After surgery, you can help prevent lung complications by doing breathing exercises.  Take deep breaths and cough every 1-2 hours.  Your doctor may order a device called an Incentive Spirometer to help you take deep breaths. When coughing or sneezing, hold a pillow firmly against your incision with both hands. This is called splinting. Doing this helps protect your incision. It also decreases belly discomfort.  If you are being admitted to the hospital overnight, leave your  suitcase in the car. After surgery it may be brought to your room.  In case of increased patient census, it may be necessary for you, the patient, to continue your postoperative care in the Same Day Surgery department.  If you are being discharged the day of surgery, you will not be allowed to drive home. You will need a responsible individual to drive you home and stay with you for 24 hours after surgery.   If you are taking public transportation, you will need to have a responsible individual with you.  Please call the Pre-admissions Testing Dept. at [phone] if you have any questions about these instructions.  Surgery Visitation Policy:  Patients having surgery or a procedure may have two visitors.  Children under the age of 40 must have an adult with them who is not the patient.  Inpatient Visitation:    Visiting hours are 7 a.m. to 8 p.m. Up to four visitors are allowed at one time in a patient room. The visitors may rotate out with other people during the day.  One visitor age 75 or older may stay with the patient overnight and must be in the room by 8 p.m.    Pre-operative 4 CHG Bath Instructions   You can play a key role in reducing the risk of infection after surgery. Your skin needs to be as free of germs as possible. You can reduce the number of germs on your skin by washing with CHG (chlorhexidine gluconate) soap before surgery. CHG is an antiseptic soap that kills germs and continues to kill germs even after washing.   DO NOT use if you have an allergy to chlorhexidine/CHG or antibacterial soaps. If your skin becomes reddened or irritated, stop using the CHG and notify one of our RNs at [phone].   Please shower with the CHG soap starting 4 days before surgery using the following schedule:     Please keep in mind the following:  DO NOT shave, including legs and underarms, starting the day of your first shower.   You may shave your face at any point  before/day of surgery.  Place clean sheets on your bed the day you start using CHG soap. Use a clean washcloth (not used since being washed) for each shower. DO NOT sleep with pets once you start using the CHG.   CHG Shower Instructions:  If you choose to wash your hair and private area, wash first with your normal shampoo/soap.  After you use shampoo/soap, rinse your hair and body thoroughly to remove shampoo/soap residue.  Turn the water OFF and apply about 3 tablespoons (45 ml) of CHG soap to a CLEAN washcloth.  Apply CHG soap ONLY FROM YOUR NECK DOWN TO YOUR TOES (washing for 3-5 minutes)  DO NOT use CHG soap on face, private areas, open wounds, or sores.  Pay special attention to the area where your surgery is being performed.  If you are having back surgery, having someone wash your back for you may be helpful. Wait 2 minutes after CHG soap is applied, then you may rinse off the CHG soap.  Pat dry with a clean towel  Put on clean clothes/pajamas   If you choose to wear lotion, please use ONLY the CHG-compatible lotions on the back of this paper.     Additional instructions for the day of surgery: DO NOT APPLY any lotions, deodorants, cologne, or perfumes.   Put on clean/comfortable clothes.  Brush your teeth.  Ask your nurse before applying any prescription medications to the skin.      CHG Compatible Lotions   Aveeno Moisturizing lotion  Cetaphil Moisturizing Cream  Cetaphil Moisturizing Lotion  Clairol Herbal Essence Moisturizing Lotion, Dry Skin  Clairol Herbal Essence Moisturizing Lotion, Extra Dry Skin  Clairol Herbal Essence Moisturizing Lotion, Normal Skin  Curel Age Defying Therapeutic Moisturizing Lotion with Alpha Hydroxy  Curel Extreme Care Body Lotion  Curel Soothing Hands Moisturizing Hand Lotion  Curel Therapeutic Moisturizing Cream, Fragrance-Free  Curel Therapeutic Moisturizing Lotion, Fragrance-Free  Curel Therapeutic Moisturizing Lotion, Original  Formula  Eucerin Daily Replenishing Lotion  Eucerin Dry Skin Therapy Plus Alpha Hydroxy Crme  Eucerin Dry Skin Therapy Plus Alpha Hydroxy Lotion  Eucerin Original Crme  Eucerin Original Lotion  Eucerin Plus Crme Eucerin Plus Lotion  Eucerin TriLipid Replenishing Lotion  Keri Anti-Bacterial Hand Lotion  Keri Deep Conditioning Original Lotion Dry Skin Formula Softly Scented  Keri Deep Conditioning Original Lotion, Fragrance Free Sensitive Skin Formula  Keri Lotion Fast Absorbing Fragrance Free Sensitive Skin Formula  Keri Lotion Fast Absorbing Softly Scented Dry Skin Formula  Keri Original Lotion  Keri Skin Renewal Lotion Keri Silky Smooth Lotion  Keri Silky Smooth Sensitive Skin Lotion  Nivea Body Creamy Conditioning Oil  Nivea Body Extra Enriched Lotion  Nivea Body Original Lotion  Nivea Body Sheer Moisturizing Lotion Nivea Crme  Nivea Skin Firming Lotion  NutraDerm 30 Skin Lotion  NutraDerm Skin Lotion  NutraDerm Therapeutic Skin Cream  NutraDerm Therapeutic Skin Lotion  ProShield Protective Hand Cream  Provon moisturizing lotion  How to Use an Incentive Spirometer An incentive spirometer is a tool that measures how well you are filling your lungs with each breath. Learning to take long, deep breaths using this tool can help you keep your lungs clear and active. This may help to reverse or lessen your chance of developing breathing (pulmonary) problems, especially infection. You may be asked to use a spirometer: After a surgery. If you have a lung problem or a history of smoking. After a long period of time when you have been unable to move or be active. If the spirometer includes an indicator to show the highest number that you have reached, your health care provider or respiratory therapist will help you set a goal. Keep a log of your progress as told by your health care provider. What are the risks? Breathing too quickly may cause dizziness or cause you to pass out. Take  your time so you do not get dizzy or light-headed. If you are in pain, you may need to take pain medicine before doing incentive spirometry. It is harder to take a deep breath if you are having pain. How to use your incentive spirometer  Sit up on the edge of your bed or on a chair. Hold the incentive spirometer so that it is in an upright position. Before you use the spirometer, breathe out normally. Place the mouthpiece in your mouth. Make sure your lips are closed tightly around it. Breathe in slowly and as deeply as you can through your mouth, causing the piston or the ball to rise toward the top of the chamber. Hold your  breath for 3-5 seconds, or for as long as possible. If the spirometer includes a coach indicator, use this to guide you in breathing. Slow down your breathing if the indicator goes above the marked areas. Remove the mouthpiece from your mouth and breathe out normally. The piston or ball will return to the bottom of the chamber. Rest for a few seconds, then repeat the steps 10 or more times. Take your time and take a few normal breaths between deep breaths so that you do not get dizzy or light-headed. Do this every 1-2 hours when you are awake. If the spirometer includes a goal marker to show the highest number you have reached (best effort), use this as a goal to work toward during each repetition. After each set of 10 deep breaths, cough a few times. This will help to make sure that your lungs are clear. If you have an incision on your chest or abdomen from surgery, place a pillow or a rolled-up towel firmly against the incision when you cough. This can help to reduce pain while taking deep breaths and coughing. General tips When you are able to get out of bed: Walk around often. Continue to take deep breaths and cough in order to clear your lungs. Keep using the incentive spirometer until your health care provider says it is okay to stop using it. If you have been in the  hospital, you may be told to keep using the spirometer at home. Contact a health care provider if: You are having difficulty using the spirometer. You have trouble using the spirometer as often as instructed. Your pain medicine is not giving enough relief for you to use the spirometer as told. You have a fever. Get help right away if: You develop shortness of breath. You develop a cough with bloody mucus from the lungs. You have fluid or blood coming from an incision site after you cough. Summary An incentive spirometer is a tool that can help you learn to take long, deep breaths to keep your lungs clear and active. You may be asked to use a spirometer after a surgery, if you have a lung problem or a history of smoking, or if you have been inactive for a long period of time. Use your incentive spirometer as instructed every 1-2 hours while you are awake. If you have an incision on your chest or abdomen, place a pillow or a rolled-up towel firmly against your incision when you cough. This will help to reduce pain. Get help right away if you have shortness of breath, you cough up bloody mucus, or blood comes from your incision when you cough. This information is not intended to replace advice given to you by your health care provider. Make sure you discuss any questions you have with your health care provider. Document Revised: 08/15/2023 Document Reviewed: 08/15/2023 Elsevier Patient Education  2024 [employer]    Preoperative Educational Videos for Total Hip, Knee and Shoulder Replacements  To better prepare for surgery, please view our videos that explain the physical activity and discharge planning required to have the best surgical recovery at [facility].  [email or web address]  Questions? Call [phone] or email [email or web address]        Community Resource Directory to address  health-related social needs:  https://.proor.no

## 2024-11-25 NOTE — Telephone Encounter (Unsigned)
 Copied from CRM [id number]. Topic: Medical Record Request - Provider/Facility Request >> Nov 25, 2024  1:21 PM Samantha Stephens wrote: Reason for CRM: Pt called in because was told that [facility] has not received the surgical clearance for surgery.    Pt has surgery next week scheduled for 12/01/2024 and would like to know if the fax was received or does it need to be sent again   Please follow up with pt

## 2024-11-26 ENCOUNTER — Ambulatory Visit: Admitting: Family Medicine

## 2024-12-01 ENCOUNTER — Encounter: Admission: RE | Payer: Self-pay | Source: Ambulatory Visit

## 2024-12-01 ENCOUNTER — Ambulatory Visit: Admission: RE | Admit: 2024-12-01 | Payer: Self-pay | Admitting: Orthopedic Surgery

## 2024-12-01 ENCOUNTER — Encounter: Payer: Self-pay | Admitting: Urgent Care

## 2025-03-21 ENCOUNTER — Encounter: Admitting: Family Medicine
